# Patient Record
Sex: Male | Born: 1940
Health system: Southern US, Community
[De-identification: ages and names within clinical notes are randomized; demographics above are authoritative.]

## PROBLEM LIST (undated history)

## (undated) DIAGNOSIS — E785 Hyperlipidemia, unspecified: Secondary | ICD-10-CM

## (undated) DIAGNOSIS — E119 Type 2 diabetes mellitus without complications: Secondary | ICD-10-CM

## (undated) DIAGNOSIS — Z79899 Other long term (current) drug therapy: Secondary | ICD-10-CM

## (undated) DIAGNOSIS — J449 Chronic obstructive pulmonary disease, unspecified: Secondary | ICD-10-CM

## (undated) DIAGNOSIS — N051 Unspecified nephritic syndrome with focal and segmental glomerular lesions: Secondary | ICD-10-CM

## (undated) DIAGNOSIS — I10 Essential (primary) hypertension: Secondary | ICD-10-CM

## (undated) DIAGNOSIS — N186 End stage renal disease: Secondary | ICD-10-CM

## (undated) DIAGNOSIS — Z7960 Long term (current) use of unspecified immunomodulators and immunosuppressants: Secondary | ICD-10-CM

## (undated) DIAGNOSIS — E1121 Type 2 diabetes mellitus with diabetic nephropathy: Secondary | ICD-10-CM

## (undated) DIAGNOSIS — I4891 Unspecified atrial fibrillation: Secondary | ICD-10-CM

## (undated) HISTORY — DX: End stage renal disease: N18.6

## (undated) HISTORY — DX: Unspecified nephritic syndrome with focal and segmental glomerular lesions: N05.1

## (undated) HISTORY — DX: Unspecified atrial fibrillation: I48.91

## (undated) HISTORY — DX: Chronic obstructive pulmonary disease, unspecified: J44.9

## (undated) HISTORY — DX: Long term (current) use of unspecified immunomodulators and immunosuppressants: Z79.60

## (undated) HISTORY — DX: Type 2 diabetes mellitus with diabetic nephropathy: E11.21

## (undated) HISTORY — DX: Hyperlipidemia, unspecified: E78.5

## (undated) HISTORY — PX: COLONOSCOPY: SHX174

## (undated) HISTORY — DX: Essential (primary) hypertension: I10

## (undated) HISTORY — DX: Other long term (current) drug therapy: Z79.899

## (undated) HISTORY — DX: Type 2 diabetes mellitus without complications: E11.9

---

## 2013-12-03 DIAGNOSIS — Z940 Kidney transplant status: Secondary | ICD-10-CM

## 2013-12-03 DIAGNOSIS — I63219 Cerebral infarction due to unspecified occlusion or stenosis of unspecified vertebral arteries: Secondary | ICD-10-CM

## 2013-12-03 DIAGNOSIS — I6381 Other cerebral infarction due to occlusion or stenosis of small artery: Secondary | ICD-10-CM

## 2013-12-03 HISTORY — DX: Kidney transplant status: Z94.0

## 2013-12-03 HISTORY — PX: OTHER SURGICAL HISTORY: SHX169

## 2013-12-03 HISTORY — DX: Other cerebral infarction due to occlusion or stenosis of small artery: I63.81

## 2013-12-03 HISTORY — DX: Cerebral infarction due to unspecified occlusion or stenosis of basilar artery: I63.219

## 2015-05-10 DIAGNOSIS — C4492 Squamous cell carcinoma of skin, unspecified: Secondary | ICD-10-CM

## 2015-05-10 HISTORY — DX: Squamous cell carcinoma of skin, unspecified: C44.92

## 2015-12-06 DIAGNOSIS — E1151 Type 2 diabetes mellitus with diabetic peripheral angiopathy without gangrene: Secondary | ICD-10-CM | POA: Diagnosis not present

## 2015-12-06 DIAGNOSIS — E1140 Type 2 diabetes mellitus with diabetic neuropathy, unspecified: Secondary | ICD-10-CM | POA: Diagnosis not present

## 2015-12-06 DIAGNOSIS — B351 Tinea unguium: Secondary | ICD-10-CM | POA: Diagnosis not present

## 2015-12-20 DIAGNOSIS — E118 Type 2 diabetes mellitus with unspecified complications: Secondary | ICD-10-CM | POA: Diagnosis not present

## 2015-12-20 DIAGNOSIS — I69923 Fluency disorder following unspecified cerebrovascular disease: Secondary | ICD-10-CM | POA: Diagnosis not present

## 2016-01-13 DIAGNOSIS — Z940 Kidney transplant status: Secondary | ICD-10-CM | POA: Diagnosis not present

## 2016-01-13 DIAGNOSIS — Z79899 Other long term (current) drug therapy: Secondary | ICD-10-CM | POA: Diagnosis not present

## 2016-01-24 DIAGNOSIS — M19011 Primary osteoarthritis, right shoulder: Secondary | ICD-10-CM | POA: Diagnosis not present

## 2016-01-24 DIAGNOSIS — M19012 Primary osteoarthritis, left shoulder: Secondary | ICD-10-CM | POA: Diagnosis not present

## 2016-02-14 DIAGNOSIS — E1151 Type 2 diabetes mellitus with diabetic peripheral angiopathy without gangrene: Secondary | ICD-10-CM | POA: Diagnosis not present

## 2016-02-14 DIAGNOSIS — E1140 Type 2 diabetes mellitus with diabetic neuropathy, unspecified: Secondary | ICD-10-CM | POA: Diagnosis not present

## 2016-02-14 DIAGNOSIS — B351 Tinea unguium: Secondary | ICD-10-CM | POA: Diagnosis not present

## 2016-03-01 DIAGNOSIS — D899 Disorder involving the immune mechanism, unspecified: Secondary | ICD-10-CM | POA: Diagnosis not present

## 2016-03-01 DIAGNOSIS — N183 Chronic kidney disease, stage 3 (moderate): Secondary | ICD-10-CM | POA: Diagnosis not present

## 2016-03-01 DIAGNOSIS — M109 Gout, unspecified: Secondary | ICD-10-CM | POA: Diagnosis not present

## 2016-03-01 DIAGNOSIS — E119 Type 2 diabetes mellitus without complications: Secondary | ICD-10-CM | POA: Diagnosis not present

## 2016-03-01 DIAGNOSIS — I4891 Unspecified atrial fibrillation: Secondary | ICD-10-CM | POA: Diagnosis not present

## 2016-03-01 DIAGNOSIS — I499 Cardiac arrhythmia, unspecified: Secondary | ICD-10-CM | POA: Diagnosis not present

## 2016-03-01 DIAGNOSIS — Z940 Kidney transplant status: Secondary | ICD-10-CM | POA: Diagnosis not present

## 2016-03-01 DIAGNOSIS — D631 Anemia in chronic kidney disease: Secondary | ICD-10-CM | POA: Diagnosis not present

## 2016-03-01 DIAGNOSIS — I129 Hypertensive chronic kidney disease with stage 1 through stage 4 chronic kidney disease, or unspecified chronic kidney disease: Secondary | ICD-10-CM | POA: Diagnosis not present

## 2016-03-06 DIAGNOSIS — I493 Ventricular premature depolarization: Secondary | ICD-10-CM | POA: Diagnosis not present

## 2016-03-06 DIAGNOSIS — Z8673 Personal history of transient ischemic attack (TIA), and cerebral infarction without residual deficits: Secondary | ICD-10-CM | POA: Diagnosis not present

## 2016-03-06 DIAGNOSIS — Z940 Kidney transplant status: Secondary | ICD-10-CM | POA: Diagnosis not present

## 2016-03-06 DIAGNOSIS — I120 Hypertensive chronic kidney disease with stage 5 chronic kidney disease or end stage renal disease: Secondary | ICD-10-CM | POA: Diagnosis not present

## 2016-03-06 DIAGNOSIS — Z7982 Long term (current) use of aspirin: Secondary | ICD-10-CM | POA: Diagnosis not present

## 2016-03-06 DIAGNOSIS — M109 Gout, unspecified: Secondary | ICD-10-CM | POA: Diagnosis not present

## 2016-03-06 DIAGNOSIS — N2581 Secondary hyperparathyroidism of renal origin: Secondary | ICD-10-CM | POA: Diagnosis not present

## 2016-03-06 DIAGNOSIS — G4733 Obstructive sleep apnea (adult) (pediatric): Secondary | ICD-10-CM | POA: Diagnosis not present

## 2016-03-06 DIAGNOSIS — Z794 Long term (current) use of insulin: Secondary | ICD-10-CM | POA: Diagnosis not present

## 2016-03-06 DIAGNOSIS — I491 Atrial premature depolarization: Secondary | ICD-10-CM | POA: Diagnosis not present

## 2016-03-06 DIAGNOSIS — Z79899 Other long term (current) drug therapy: Secondary | ICD-10-CM | POA: Diagnosis not present

## 2016-03-06 DIAGNOSIS — I10 Essential (primary) hypertension: Secondary | ICD-10-CM | POA: Diagnosis not present

## 2016-03-06 DIAGNOSIS — N186 End stage renal disease: Secondary | ICD-10-CM | POA: Diagnosis not present

## 2016-03-06 DIAGNOSIS — E119 Type 2 diabetes mellitus without complications: Secondary | ICD-10-CM | POA: Diagnosis not present

## 2016-03-06 DIAGNOSIS — E785 Hyperlipidemia, unspecified: Secondary | ICD-10-CM | POA: Diagnosis not present

## 2016-03-06 DIAGNOSIS — I481 Persistent atrial fibrillation: Secondary | ICD-10-CM | POA: Diagnosis not present

## 2016-03-15 DIAGNOSIS — I350 Nonrheumatic aortic (valve) stenosis: Secondary | ICD-10-CM | POA: Diagnosis not present

## 2016-03-15 DIAGNOSIS — I700 Atherosclerosis of aorta: Secondary | ICD-10-CM | POA: Diagnosis not present

## 2016-03-15 DIAGNOSIS — I517 Cardiomegaly: Secondary | ICD-10-CM | POA: Diagnosis not present

## 2016-03-15 DIAGNOSIS — I481 Persistent atrial fibrillation: Secondary | ICD-10-CM | POA: Diagnosis not present

## 2016-03-16 DIAGNOSIS — H6123 Impacted cerumen, bilateral: Secondary | ICD-10-CM | POA: Diagnosis not present

## 2016-04-05 DIAGNOSIS — Z79899 Other long term (current) drug therapy: Secondary | ICD-10-CM | POA: Diagnosis not present

## 2016-04-05 DIAGNOSIS — Z5181 Encounter for therapeutic drug level monitoring: Secondary | ICD-10-CM | POA: Diagnosis not present

## 2016-04-10 DIAGNOSIS — L03031 Cellulitis of right toe: Secondary | ICD-10-CM | POA: Diagnosis not present

## 2016-04-10 DIAGNOSIS — Z5181 Encounter for therapeutic drug level monitoring: Secondary | ICD-10-CM | POA: Diagnosis not present

## 2016-04-10 DIAGNOSIS — Z79899 Other long term (current) drug therapy: Secondary | ICD-10-CM | POA: Diagnosis not present

## 2016-04-10 DIAGNOSIS — M79671 Pain in right foot: Secondary | ICD-10-CM | POA: Diagnosis not present

## 2016-04-10 DIAGNOSIS — M79674 Pain in right toe(s): Secondary | ICD-10-CM | POA: Diagnosis not present

## 2016-04-10 DIAGNOSIS — L600 Ingrowing nail: Secondary | ICD-10-CM | POA: Diagnosis not present

## 2016-04-18 DIAGNOSIS — Z961 Presence of intraocular lens: Secondary | ICD-10-CM | POA: Diagnosis not present

## 2016-04-18 DIAGNOSIS — E119 Type 2 diabetes mellitus without complications: Secondary | ICD-10-CM | POA: Diagnosis not present

## 2016-04-18 DIAGNOSIS — Z794 Long term (current) use of insulin: Secondary | ICD-10-CM | POA: Diagnosis not present

## 2016-04-19 DIAGNOSIS — Z79899 Other long term (current) drug therapy: Secondary | ICD-10-CM | POA: Diagnosis not present

## 2016-04-19 DIAGNOSIS — Z5181 Encounter for therapeutic drug level monitoring: Secondary | ICD-10-CM | POA: Diagnosis not present

## 2016-04-19 DIAGNOSIS — E1165 Type 2 diabetes mellitus with hyperglycemia: Secondary | ICD-10-CM | POA: Diagnosis not present

## 2016-04-25 DIAGNOSIS — D044 Carcinoma in situ of skin of scalp and neck: Secondary | ICD-10-CM | POA: Diagnosis not present

## 2016-04-26 DIAGNOSIS — L03031 Cellulitis of right toe: Secondary | ICD-10-CM | POA: Diagnosis not present

## 2016-04-26 DIAGNOSIS — L600 Ingrowing nail: Secondary | ICD-10-CM | POA: Diagnosis not present

## 2016-04-26 DIAGNOSIS — M79674 Pain in right toe(s): Secondary | ICD-10-CM | POA: Diagnosis not present

## 2016-04-26 DIAGNOSIS — M79671 Pain in right foot: Secondary | ICD-10-CM | POA: Diagnosis not present

## 2016-05-03 DIAGNOSIS — Z79899 Other long term (current) drug therapy: Secondary | ICD-10-CM | POA: Diagnosis not present

## 2016-05-03 DIAGNOSIS — Z5181 Encounter for therapeutic drug level monitoring: Secondary | ICD-10-CM | POA: Diagnosis not present

## 2016-05-08 DIAGNOSIS — E1140 Type 2 diabetes mellitus with diabetic neuropathy, unspecified: Secondary | ICD-10-CM | POA: Diagnosis not present

## 2016-05-08 DIAGNOSIS — B351 Tinea unguium: Secondary | ICD-10-CM | POA: Diagnosis not present

## 2016-05-08 DIAGNOSIS — E1151 Type 2 diabetes mellitus with diabetic peripheral angiopathy without gangrene: Secondary | ICD-10-CM | POA: Diagnosis not present

## 2016-05-09 DIAGNOSIS — M19011 Primary osteoarthritis, right shoulder: Secondary | ICD-10-CM | POA: Diagnosis not present

## 2016-05-09 DIAGNOSIS — Z7982 Long term (current) use of aspirin: Secondary | ICD-10-CM | POA: Diagnosis not present

## 2016-05-09 DIAGNOSIS — Z794 Long term (current) use of insulin: Secondary | ICD-10-CM | POA: Diagnosis not present

## 2016-05-09 DIAGNOSIS — E119 Type 2 diabetes mellitus without complications: Secondary | ICD-10-CM | POA: Diagnosis not present

## 2016-05-09 DIAGNOSIS — I10 Essential (primary) hypertension: Secondary | ICD-10-CM | POA: Diagnosis not present

## 2016-05-09 DIAGNOSIS — R509 Fever, unspecified: Secondary | ICD-10-CM | POA: Diagnosis not present

## 2016-05-09 DIAGNOSIS — M25511 Pain in right shoulder: Secondary | ICD-10-CM | POA: Diagnosis not present

## 2016-05-09 DIAGNOSIS — Z940 Kidney transplant status: Secondary | ICD-10-CM | POA: Diagnosis not present

## 2016-05-09 DIAGNOSIS — Z79899 Other long term (current) drug therapy: Secondary | ICD-10-CM | POA: Diagnosis not present

## 2016-05-17 DIAGNOSIS — M7551 Bursitis of right shoulder: Secondary | ICD-10-CM | POA: Diagnosis not present

## 2016-05-17 DIAGNOSIS — M19011 Primary osteoarthritis, right shoulder: Secondary | ICD-10-CM | POA: Diagnosis not present

## 2016-05-17 DIAGNOSIS — M25511 Pain in right shoulder: Secondary | ICD-10-CM | POA: Diagnosis not present

## 2016-06-01 DIAGNOSIS — R112 Nausea with vomiting, unspecified: Secondary | ICD-10-CM | POA: Diagnosis not present

## 2016-06-01 DIAGNOSIS — N289 Disorder of kidney and ureter, unspecified: Secondary | ICD-10-CM | POA: Diagnosis not present

## 2016-06-01 DIAGNOSIS — Z888 Allergy status to other drugs, medicaments and biological substances status: Secondary | ICD-10-CM | POA: Diagnosis not present

## 2016-06-01 DIAGNOSIS — N189 Chronic kidney disease, unspecified: Secondary | ICD-10-CM | POA: Diagnosis not present

## 2016-06-01 DIAGNOSIS — Z7902 Long term (current) use of antithrombotics/antiplatelets: Secondary | ICD-10-CM | POA: Diagnosis not present

## 2016-06-01 DIAGNOSIS — Z79899 Other long term (current) drug therapy: Secondary | ICD-10-CM | POA: Diagnosis not present

## 2016-06-01 DIAGNOSIS — N179 Acute kidney failure, unspecified: Secondary | ICD-10-CM | POA: Diagnosis not present

## 2016-06-01 DIAGNOSIS — Z794 Long term (current) use of insulin: Secondary | ICD-10-CM | POA: Diagnosis not present

## 2016-06-01 DIAGNOSIS — R109 Unspecified abdominal pain: Secondary | ICD-10-CM | POA: Diagnosis not present

## 2016-06-01 DIAGNOSIS — M109 Gout, unspecified: Secondary | ICD-10-CM | POA: Diagnosis not present

## 2016-06-01 DIAGNOSIS — E1122 Type 2 diabetes mellitus with diabetic chronic kidney disease: Secondary | ICD-10-CM | POA: Diagnosis not present

## 2016-06-01 DIAGNOSIS — E860 Dehydration: Secondary | ICD-10-CM | POA: Diagnosis not present

## 2016-06-01 DIAGNOSIS — Z940 Kidney transplant status: Secondary | ICD-10-CM | POA: Diagnosis not present

## 2016-06-02 DIAGNOSIS — E1122 Type 2 diabetes mellitus with diabetic chronic kidney disease: Secondary | ICD-10-CM | POA: Diagnosis not present

## 2016-06-02 DIAGNOSIS — N179 Acute kidney failure, unspecified: Secondary | ICD-10-CM | POA: Diagnosis not present

## 2016-06-02 DIAGNOSIS — R109 Unspecified abdominal pain: Secondary | ICD-10-CM | POA: Diagnosis not present

## 2016-06-06 DIAGNOSIS — N289 Disorder of kidney and ureter, unspecified: Secondary | ICD-10-CM | POA: Diagnosis not present

## 2016-06-06 DIAGNOSIS — R109 Unspecified abdominal pain: Secondary | ICD-10-CM | POA: Diagnosis not present

## 2016-06-06 DIAGNOSIS — Z940 Kidney transplant status: Secondary | ICD-10-CM | POA: Diagnosis not present

## 2016-06-13 DIAGNOSIS — Z940 Kidney transplant status: Secondary | ICD-10-CM | POA: Diagnosis not present

## 2016-06-13 DIAGNOSIS — R109 Unspecified abdominal pain: Secondary | ICD-10-CM | POA: Diagnosis not present

## 2016-06-13 DIAGNOSIS — N289 Disorder of kidney and ureter, unspecified: Secondary | ICD-10-CM | POA: Diagnosis not present

## 2016-07-11 DIAGNOSIS — R0989 Other specified symptoms and signs involving the circulatory and respiratory systems: Secondary | ICD-10-CM | POA: Diagnosis not present

## 2016-07-11 DIAGNOSIS — I480 Paroxysmal atrial fibrillation: Secondary | ICD-10-CM | POA: Diagnosis not present

## 2016-07-11 DIAGNOSIS — I639 Cerebral infarction, unspecified: Secondary | ICD-10-CM | POA: Diagnosis not present

## 2016-07-12 DIAGNOSIS — N186 End stage renal disease: Secondary | ICD-10-CM | POA: Diagnosis not present

## 2016-07-12 DIAGNOSIS — Z940 Kidney transplant status: Secondary | ICD-10-CM | POA: Diagnosis not present

## 2016-07-12 DIAGNOSIS — E785 Hyperlipidemia, unspecified: Secondary | ICD-10-CM | POA: Diagnosis not present

## 2016-07-12 DIAGNOSIS — I4891 Unspecified atrial fibrillation: Secondary | ICD-10-CM | POA: Diagnosis not present

## 2016-07-12 DIAGNOSIS — I129 Hypertensive chronic kidney disease with stage 1 through stage 4 chronic kidney disease, or unspecified chronic kidney disease: Secondary | ICD-10-CM | POA: Diagnosis not present

## 2016-07-12 DIAGNOSIS — Z7901 Long term (current) use of anticoagulants: Secondary | ICD-10-CM | POA: Diagnosis not present

## 2016-07-12 DIAGNOSIS — Z794 Long term (current) use of insulin: Secondary | ICD-10-CM | POA: Diagnosis not present

## 2016-07-12 DIAGNOSIS — D631 Anemia in chronic kidney disease: Secondary | ICD-10-CM | POA: Diagnosis not present

## 2016-07-12 DIAGNOSIS — I480 Paroxysmal atrial fibrillation: Secondary | ICD-10-CM | POA: Diagnosis not present

## 2016-07-12 DIAGNOSIS — I120 Hypertensive chronic kidney disease with stage 5 chronic kidney disease or end stage renal disease: Secondary | ICD-10-CM | POA: Diagnosis not present

## 2016-07-12 DIAGNOSIS — D899 Disorder involving the immune mechanism, unspecified: Secondary | ICD-10-CM | POA: Diagnosis not present

## 2016-07-12 DIAGNOSIS — Z79899 Other long term (current) drug therapy: Secondary | ICD-10-CM | POA: Diagnosis not present

## 2016-07-12 DIAGNOSIS — N2581 Secondary hyperparathyroidism of renal origin: Secondary | ICD-10-CM | POA: Diagnosis not present

## 2016-07-12 DIAGNOSIS — E1121 Type 2 diabetes mellitus with diabetic nephropathy: Secondary | ICD-10-CM | POA: Diagnosis not present

## 2016-07-12 DIAGNOSIS — M109 Gout, unspecified: Secondary | ICD-10-CM | POA: Diagnosis not present

## 2016-07-20 DIAGNOSIS — I639 Cerebral infarction, unspecified: Secondary | ICD-10-CM | POA: Diagnosis not present

## 2016-07-20 DIAGNOSIS — I480 Paroxysmal atrial fibrillation: Secondary | ICD-10-CM | POA: Diagnosis not present

## 2016-07-20 DIAGNOSIS — R0989 Other specified symptoms and signs involving the circulatory and respiratory systems: Secondary | ICD-10-CM | POA: Diagnosis not present

## 2016-07-24 DIAGNOSIS — Z79899 Other long term (current) drug therapy: Secondary | ICD-10-CM | POA: Diagnosis not present

## 2016-07-24 DIAGNOSIS — E1151 Type 2 diabetes mellitus with diabetic peripheral angiopathy without gangrene: Secondary | ICD-10-CM | POA: Diagnosis not present

## 2016-07-24 DIAGNOSIS — M1A4710 Other secondary chronic gout, right ankle and foot, without tophus (tophi): Secondary | ICD-10-CM | POA: Diagnosis not present

## 2016-07-24 DIAGNOSIS — E1140 Type 2 diabetes mellitus with diabetic neuropathy, unspecified: Secondary | ICD-10-CM | POA: Diagnosis not present

## 2016-07-24 DIAGNOSIS — Z5181 Encounter for therapeutic drug level monitoring: Secondary | ICD-10-CM | POA: Diagnosis not present

## 2016-07-24 DIAGNOSIS — B351 Tinea unguium: Secondary | ICD-10-CM | POA: Diagnosis not present

## 2016-07-25 DIAGNOSIS — Z719 Counseling, unspecified: Secondary | ICD-10-CM | POA: Diagnosis not present

## 2016-07-25 DIAGNOSIS — M25562 Pain in left knee: Secondary | ICD-10-CM | POA: Diagnosis not present

## 2016-08-01 DIAGNOSIS — Z719 Counseling, unspecified: Secondary | ICD-10-CM | POA: Diagnosis not present

## 2016-08-01 DIAGNOSIS — M25562 Pain in left knee: Secondary | ICD-10-CM | POA: Diagnosis not present

## 2016-08-14 DIAGNOSIS — Z79899 Other long term (current) drug therapy: Secondary | ICD-10-CM | POA: Diagnosis not present

## 2016-08-14 DIAGNOSIS — Z5181 Encounter for therapeutic drug level monitoring: Secondary | ICD-10-CM | POA: Diagnosis not present

## 2016-08-16 DIAGNOSIS — M11261 Other chondrocalcinosis, right knee: Secondary | ICD-10-CM | POA: Diagnosis not present

## 2016-08-16 DIAGNOSIS — Z794 Long term (current) use of insulin: Secondary | ICD-10-CM | POA: Diagnosis not present

## 2016-08-16 DIAGNOSIS — N183 Chronic kidney disease, stage 3 (moderate): Secondary | ICD-10-CM | POA: Diagnosis not present

## 2016-08-16 DIAGNOSIS — M11262 Other chondrocalcinosis, left knee: Secondary | ICD-10-CM | POA: Diagnosis not present

## 2016-08-16 DIAGNOSIS — Z940 Kidney transplant status: Secondary | ICD-10-CM | POA: Diagnosis not present

## 2016-08-16 DIAGNOSIS — G8929 Other chronic pain: Secondary | ICD-10-CM | POA: Diagnosis not present

## 2016-08-16 DIAGNOSIS — I129 Hypertensive chronic kidney disease with stage 1 through stage 4 chronic kidney disease, or unspecified chronic kidney disease: Secondary | ICD-10-CM | POA: Diagnosis not present

## 2016-08-16 DIAGNOSIS — M109 Gout, unspecified: Secondary | ICD-10-CM | POA: Diagnosis not present

## 2016-08-16 DIAGNOSIS — I69320 Aphasia following cerebral infarction: Secondary | ICD-10-CM | POA: Diagnosis not present

## 2016-08-16 DIAGNOSIS — Z7901 Long term (current) use of anticoagulants: Secondary | ICD-10-CM | POA: Diagnosis not present

## 2016-08-16 DIAGNOSIS — E119 Type 2 diabetes mellitus without complications: Secondary | ICD-10-CM | POA: Diagnosis not present

## 2016-08-16 DIAGNOSIS — Z87891 Personal history of nicotine dependence: Secondary | ICD-10-CM | POA: Diagnosis not present

## 2016-08-16 DIAGNOSIS — M170 Bilateral primary osteoarthritis of knee: Secondary | ICD-10-CM | POA: Diagnosis not present

## 2016-08-22 DIAGNOSIS — M7050 Other bursitis of knee, unspecified knee: Secondary | ICD-10-CM | POA: Diagnosis not present

## 2016-08-22 DIAGNOSIS — M7632 Iliotibial band syndrome, left leg: Secondary | ICD-10-CM | POA: Diagnosis not present

## 2016-08-24 DIAGNOSIS — E1165 Type 2 diabetes mellitus with hyperglycemia: Secondary | ICD-10-CM | POA: Diagnosis not present

## 2016-08-24 DIAGNOSIS — I10 Essential (primary) hypertension: Secondary | ICD-10-CM | POA: Diagnosis not present

## 2016-08-24 DIAGNOSIS — M7632 Iliotibial band syndrome, left leg: Secondary | ICD-10-CM | POA: Diagnosis not present

## 2016-08-24 DIAGNOSIS — Z1389 Encounter for screening for other disorder: Secondary | ICD-10-CM | POA: Diagnosis not present

## 2016-08-24 DIAGNOSIS — Z6825 Body mass index (BMI) 25.0-25.9, adult: Secondary | ICD-10-CM | POA: Diagnosis not present

## 2016-08-24 DIAGNOSIS — I69923 Fluency disorder following unspecified cerebrovascular disease: Secondary | ICD-10-CM | POA: Diagnosis not present

## 2016-08-24 DIAGNOSIS — M7050 Other bursitis of knee, unspecified knee: Secondary | ICD-10-CM | POA: Diagnosis not present

## 2016-08-24 DIAGNOSIS — Z0000 Encounter for general adult medical examination without abnormal findings: Secondary | ICD-10-CM | POA: Diagnosis not present

## 2016-08-24 DIAGNOSIS — Z1211 Encounter for screening for malignant neoplasm of colon: Secondary | ICD-10-CM | POA: Diagnosis not present

## 2016-08-24 DIAGNOSIS — E7801 Familial hypercholesterolemia: Secondary | ICD-10-CM | POA: Diagnosis not present

## 2016-08-27 DIAGNOSIS — M7050 Other bursitis of knee, unspecified knee: Secondary | ICD-10-CM | POA: Diagnosis not present

## 2016-08-27 DIAGNOSIS — M7632 Iliotibial band syndrome, left leg: Secondary | ICD-10-CM | POA: Diagnosis not present

## 2016-08-29 DIAGNOSIS — M7050 Other bursitis of knee, unspecified knee: Secondary | ICD-10-CM | POA: Diagnosis not present

## 2016-08-29 DIAGNOSIS — M7632 Iliotibial band syndrome, left leg: Secondary | ICD-10-CM | POA: Diagnosis not present

## 2016-08-31 DIAGNOSIS — M7632 Iliotibial band syndrome, left leg: Secondary | ICD-10-CM | POA: Diagnosis not present

## 2016-08-31 DIAGNOSIS — M7050 Other bursitis of knee, unspecified knee: Secondary | ICD-10-CM | POA: Diagnosis not present

## 2016-09-03 DIAGNOSIS — M7050 Other bursitis of knee, unspecified knee: Secondary | ICD-10-CM | POA: Diagnosis not present

## 2016-09-03 DIAGNOSIS — M7632 Iliotibial band syndrome, left leg: Secondary | ICD-10-CM | POA: Diagnosis not present

## 2016-09-05 DIAGNOSIS — M7050 Other bursitis of knee, unspecified knee: Secondary | ICD-10-CM | POA: Diagnosis not present

## 2016-09-05 DIAGNOSIS — M7632 Iliotibial band syndrome, left leg: Secondary | ICD-10-CM | POA: Diagnosis not present

## 2016-09-07 DIAGNOSIS — M7050 Other bursitis of knee, unspecified knee: Secondary | ICD-10-CM | POA: Diagnosis not present

## 2016-09-07 DIAGNOSIS — M7632 Iliotibial band syndrome, left leg: Secondary | ICD-10-CM | POA: Diagnosis not present

## 2016-09-10 DIAGNOSIS — M7632 Iliotibial band syndrome, left leg: Secondary | ICD-10-CM | POA: Diagnosis not present

## 2016-09-10 DIAGNOSIS — M7050 Other bursitis of knee, unspecified knee: Secondary | ICD-10-CM | POA: Diagnosis not present

## 2016-09-12 DIAGNOSIS — M7050 Other bursitis of knee, unspecified knee: Secondary | ICD-10-CM | POA: Diagnosis not present

## 2016-09-12 DIAGNOSIS — M7632 Iliotibial band syndrome, left leg: Secondary | ICD-10-CM | POA: Diagnosis not present

## 2016-09-14 DIAGNOSIS — M7632 Iliotibial band syndrome, left leg: Secondary | ICD-10-CM | POA: Diagnosis not present

## 2016-09-14 DIAGNOSIS — M7050 Other bursitis of knee, unspecified knee: Secondary | ICD-10-CM | POA: Diagnosis not present

## 2016-09-17 DIAGNOSIS — Z7901 Long term (current) use of anticoagulants: Secondary | ICD-10-CM | POA: Diagnosis not present

## 2016-09-17 DIAGNOSIS — E119 Type 2 diabetes mellitus without complications: Secondary | ICD-10-CM | POA: Diagnosis not present

## 2016-09-17 DIAGNOSIS — I10 Essential (primary) hypertension: Secondary | ICD-10-CM | POA: Diagnosis not present

## 2016-09-17 DIAGNOSIS — M19012 Primary osteoarthritis, left shoulder: Secondary | ICD-10-CM | POA: Diagnosis not present

## 2016-09-17 DIAGNOSIS — Z79899 Other long term (current) drug therapy: Secondary | ICD-10-CM | POA: Diagnosis not present

## 2016-09-17 DIAGNOSIS — M19011 Primary osteoarthritis, right shoulder: Secondary | ICD-10-CM | POA: Diagnosis not present

## 2016-09-17 DIAGNOSIS — Z794 Long term (current) use of insulin: Secondary | ICD-10-CM | POA: Diagnosis not present

## 2016-09-17 DIAGNOSIS — M7632 Iliotibial band syndrome, left leg: Secondary | ICD-10-CM | POA: Diagnosis not present

## 2016-09-17 DIAGNOSIS — Z87891 Personal history of nicotine dependence: Secondary | ICD-10-CM | POA: Diagnosis not present

## 2016-09-17 DIAGNOSIS — M7050 Other bursitis of knee, unspecified knee: Secondary | ICD-10-CM | POA: Diagnosis not present

## 2016-09-17 DIAGNOSIS — Z940 Kidney transplant status: Secondary | ICD-10-CM | POA: Diagnosis not present

## 2016-09-18 DIAGNOSIS — H6123 Impacted cerumen, bilateral: Secondary | ICD-10-CM | POA: Diagnosis not present

## 2016-09-19 DIAGNOSIS — M7632 Iliotibial band syndrome, left leg: Secondary | ICD-10-CM | POA: Diagnosis not present

## 2016-09-19 DIAGNOSIS — M7050 Other bursitis of knee, unspecified knee: Secondary | ICD-10-CM | POA: Diagnosis not present

## 2016-09-21 DIAGNOSIS — M7050 Other bursitis of knee, unspecified knee: Secondary | ICD-10-CM | POA: Diagnosis not present

## 2016-09-21 DIAGNOSIS — M7632 Iliotibial band syndrome, left leg: Secondary | ICD-10-CM | POA: Diagnosis not present

## 2016-09-25 DIAGNOSIS — Z79899 Other long term (current) drug therapy: Secondary | ICD-10-CM | POA: Diagnosis not present

## 2016-09-25 DIAGNOSIS — Z5181 Encounter for therapeutic drug level monitoring: Secondary | ICD-10-CM | POA: Diagnosis not present

## 2016-09-26 DIAGNOSIS — M7632 Iliotibial band syndrome, left leg: Secondary | ICD-10-CM | POA: Diagnosis not present

## 2016-09-26 DIAGNOSIS — M7050 Other bursitis of knee, unspecified knee: Secondary | ICD-10-CM | POA: Diagnosis not present

## 2016-10-01 DIAGNOSIS — M7632 Iliotibial band syndrome, left leg: Secondary | ICD-10-CM | POA: Diagnosis not present

## 2016-10-01 DIAGNOSIS — M7050 Other bursitis of knee, unspecified knee: Secondary | ICD-10-CM | POA: Diagnosis not present

## 2016-10-03 DIAGNOSIS — M7050 Other bursitis of knee, unspecified knee: Secondary | ICD-10-CM | POA: Diagnosis not present

## 2016-10-03 DIAGNOSIS — M7632 Iliotibial band syndrome, left leg: Secondary | ICD-10-CM | POA: Diagnosis not present

## 2016-10-05 DIAGNOSIS — M7632 Iliotibial band syndrome, left leg: Secondary | ICD-10-CM | POA: Diagnosis not present

## 2016-10-05 DIAGNOSIS — M7050 Other bursitis of knee, unspecified knee: Secondary | ICD-10-CM | POA: Diagnosis not present

## 2016-10-08 DIAGNOSIS — M7050 Other bursitis of knee, unspecified knee: Secondary | ICD-10-CM | POA: Diagnosis not present

## 2016-10-08 DIAGNOSIS — M7632 Iliotibial band syndrome, left leg: Secondary | ICD-10-CM | POA: Diagnosis not present

## 2016-10-09 DIAGNOSIS — E1140 Type 2 diabetes mellitus with diabetic neuropathy, unspecified: Secondary | ICD-10-CM | POA: Diagnosis not present

## 2016-10-09 DIAGNOSIS — E1151 Type 2 diabetes mellitus with diabetic peripheral angiopathy without gangrene: Secondary | ICD-10-CM | POA: Diagnosis not present

## 2016-10-09 DIAGNOSIS — B351 Tinea unguium: Secondary | ICD-10-CM | POA: Diagnosis not present

## 2016-10-11 DIAGNOSIS — Z5181 Encounter for therapeutic drug level monitoring: Secondary | ICD-10-CM | POA: Diagnosis not present

## 2016-10-11 DIAGNOSIS — Z79899 Other long term (current) drug therapy: Secondary | ICD-10-CM | POA: Diagnosis not present

## 2016-10-18 DIAGNOSIS — N2581 Secondary hyperparathyroidism of renal origin: Secondary | ICD-10-CM | POA: Diagnosis not present

## 2016-10-18 DIAGNOSIS — Z87891 Personal history of nicotine dependence: Secondary | ICD-10-CM | POA: Diagnosis not present

## 2016-10-18 DIAGNOSIS — Z9989 Dependence on other enabling machines and devices: Secondary | ICD-10-CM | POA: Diagnosis not present

## 2016-10-18 DIAGNOSIS — E1122 Type 2 diabetes mellitus with diabetic chronic kidney disease: Secondary | ICD-10-CM | POA: Diagnosis not present

## 2016-10-18 DIAGNOSIS — N186 End stage renal disease: Secondary | ICD-10-CM | POA: Diagnosis not present

## 2016-10-18 DIAGNOSIS — E785 Hyperlipidemia, unspecified: Secondary | ICD-10-CM | POA: Diagnosis not present

## 2016-10-18 DIAGNOSIS — Z794 Long term (current) use of insulin: Secondary | ICD-10-CM | POA: Diagnosis not present

## 2016-10-18 DIAGNOSIS — I120 Hypertensive chronic kidney disease with stage 5 chronic kidney disease or end stage renal disease: Secondary | ICD-10-CM | POA: Diagnosis not present

## 2016-10-18 DIAGNOSIS — E119 Type 2 diabetes mellitus without complications: Secondary | ICD-10-CM | POA: Diagnosis not present

## 2016-10-18 DIAGNOSIS — Z201 Contact with and (suspected) exposure to tuberculosis: Secondary | ICD-10-CM | POA: Diagnosis not present

## 2016-10-18 DIAGNOSIS — M109 Gout, unspecified: Secondary | ICD-10-CM | POA: Diagnosis not present

## 2016-10-18 DIAGNOSIS — D8989 Other specified disorders involving the immune mechanism, not elsewhere classified: Secondary | ICD-10-CM | POA: Diagnosis not present

## 2016-10-18 DIAGNOSIS — I10 Essential (primary) hypertension: Secondary | ICD-10-CM | POA: Diagnosis not present

## 2016-10-18 DIAGNOSIS — G4733 Obstructive sleep apnea (adult) (pediatric): Secondary | ICD-10-CM | POA: Diagnosis not present

## 2016-10-18 DIAGNOSIS — Z940 Kidney transplant status: Secondary | ICD-10-CM | POA: Diagnosis not present

## 2016-10-18 DIAGNOSIS — Z8673 Personal history of transient ischemic attack (TIA), and cerebral infarction without residual deficits: Secondary | ICD-10-CM | POA: Diagnosis not present

## 2016-10-18 DIAGNOSIS — Z4822 Encounter for aftercare following kidney transplant: Secondary | ICD-10-CM | POA: Diagnosis not present

## 2016-11-13 DIAGNOSIS — Z79899 Other long term (current) drug therapy: Secondary | ICD-10-CM | POA: Diagnosis not present

## 2016-11-13 DIAGNOSIS — Z5181 Encounter for therapeutic drug level monitoring: Secondary | ICD-10-CM | POA: Diagnosis not present

## 2016-12-11 DIAGNOSIS — Z5181 Encounter for therapeutic drug level monitoring: Secondary | ICD-10-CM | POA: Diagnosis not present

## 2016-12-11 DIAGNOSIS — Z79899 Other long term (current) drug therapy: Secondary | ICD-10-CM | POA: Diagnosis not present

## 2016-12-18 DIAGNOSIS — E1151 Type 2 diabetes mellitus with diabetic peripheral angiopathy without gangrene: Secondary | ICD-10-CM | POA: Diagnosis not present

## 2016-12-18 DIAGNOSIS — E1140 Type 2 diabetes mellitus with diabetic neuropathy, unspecified: Secondary | ICD-10-CM | POA: Diagnosis not present

## 2016-12-18 DIAGNOSIS — B351 Tinea unguium: Secondary | ICD-10-CM | POA: Diagnosis not present

## 2017-01-01 DIAGNOSIS — Z5181 Encounter for therapeutic drug level monitoring: Secondary | ICD-10-CM | POA: Diagnosis not present

## 2017-01-01 DIAGNOSIS — Z79899 Other long term (current) drug therapy: Secondary | ICD-10-CM | POA: Diagnosis not present

## 2017-01-08 DIAGNOSIS — L570 Actinic keratosis: Secondary | ICD-10-CM | POA: Diagnosis not present

## 2017-01-08 DIAGNOSIS — C4491 Basal cell carcinoma of skin, unspecified: Secondary | ICD-10-CM

## 2017-01-08 DIAGNOSIS — C44612 Basal cell carcinoma of skin of right upper limb, including shoulder: Secondary | ICD-10-CM | POA: Diagnosis not present

## 2017-01-08 HISTORY — DX: Basal cell carcinoma of skin, unspecified: C44.91

## 2017-01-15 DIAGNOSIS — E1165 Type 2 diabetes mellitus with hyperglycemia: Secondary | ICD-10-CM | POA: Diagnosis not present

## 2017-02-12 DIAGNOSIS — Z5181 Encounter for therapeutic drug level monitoring: Secondary | ICD-10-CM | POA: Diagnosis not present

## 2017-02-12 DIAGNOSIS — Z79899 Other long term (current) drug therapy: Secondary | ICD-10-CM | POA: Diagnosis not present

## 2017-02-26 DIAGNOSIS — D631 Anemia in chronic kidney disease: Secondary | ICD-10-CM | POA: Diagnosis not present

## 2017-02-26 DIAGNOSIS — I129 Hypertensive chronic kidney disease with stage 1 through stage 4 chronic kidney disease, or unspecified chronic kidney disease: Secondary | ICD-10-CM | POA: Diagnosis not present

## 2017-02-26 DIAGNOSIS — Z7901 Long term (current) use of anticoagulants: Secondary | ICD-10-CM | POA: Diagnosis not present

## 2017-02-26 DIAGNOSIS — R809 Proteinuria, unspecified: Secondary | ICD-10-CM | POA: Diagnosis not present

## 2017-02-26 DIAGNOSIS — E785 Hyperlipidemia, unspecified: Secondary | ICD-10-CM | POA: Diagnosis not present

## 2017-02-26 DIAGNOSIS — Z794 Long term (current) use of insulin: Secondary | ICD-10-CM | POA: Diagnosis not present

## 2017-02-26 DIAGNOSIS — Z87891 Personal history of nicotine dependence: Secondary | ICD-10-CM | POA: Diagnosis not present

## 2017-02-26 DIAGNOSIS — Z79899 Other long term (current) drug therapy: Secondary | ICD-10-CM | POA: Diagnosis not present

## 2017-02-26 DIAGNOSIS — M109 Gout, unspecified: Secondary | ICD-10-CM | POA: Diagnosis not present

## 2017-02-26 DIAGNOSIS — I4891 Unspecified atrial fibrillation: Secondary | ICD-10-CM | POA: Diagnosis not present

## 2017-02-26 DIAGNOSIS — E1122 Type 2 diabetes mellitus with diabetic chronic kidney disease: Secondary | ICD-10-CM | POA: Diagnosis not present

## 2017-02-26 DIAGNOSIS — Z940 Kidney transplant status: Secondary | ICD-10-CM | POA: Diagnosis not present

## 2017-02-26 DIAGNOSIS — N183 Chronic kidney disease, stage 3 (moderate): Secondary | ICD-10-CM | POA: Diagnosis not present

## 2017-03-05 DIAGNOSIS — E1151 Type 2 diabetes mellitus with diabetic peripheral angiopathy without gangrene: Secondary | ICD-10-CM | POA: Diagnosis not present

## 2017-03-05 DIAGNOSIS — E1140 Type 2 diabetes mellitus with diabetic neuropathy, unspecified: Secondary | ICD-10-CM | POA: Diagnosis not present

## 2017-03-05 DIAGNOSIS — B351 Tinea unguium: Secondary | ICD-10-CM | POA: Diagnosis not present

## 2017-03-18 DIAGNOSIS — H6123 Impacted cerumen, bilateral: Secondary | ICD-10-CM | POA: Diagnosis not present

## 2017-04-11 DIAGNOSIS — Z5181 Encounter for therapeutic drug level monitoring: Secondary | ICD-10-CM | POA: Diagnosis not present

## 2017-04-11 DIAGNOSIS — Z79899 Other long term (current) drug therapy: Secondary | ICD-10-CM | POA: Diagnosis not present

## 2017-04-23 DIAGNOSIS — Z794 Long term (current) use of insulin: Secondary | ICD-10-CM | POA: Diagnosis not present

## 2017-04-23 DIAGNOSIS — E119 Type 2 diabetes mellitus without complications: Secondary | ICD-10-CM | POA: Diagnosis not present

## 2017-04-23 DIAGNOSIS — Z961 Presence of intraocular lens: Secondary | ICD-10-CM | POA: Diagnosis not present

## 2017-05-08 DIAGNOSIS — M25511 Pain in right shoulder: Secondary | ICD-10-CM | POA: Diagnosis not present

## 2017-05-08 DIAGNOSIS — M546 Pain in thoracic spine: Secondary | ICD-10-CM | POA: Diagnosis not present

## 2017-05-08 DIAGNOSIS — M9902 Segmental and somatic dysfunction of thoracic region: Secondary | ICD-10-CM | POA: Diagnosis not present

## 2017-05-08 DIAGNOSIS — M9901 Segmental and somatic dysfunction of cervical region: Secondary | ICD-10-CM | POA: Diagnosis not present

## 2017-05-08 DIAGNOSIS — M5032 Other cervical disc degeneration, mid-cervical region, unspecified level: Secondary | ICD-10-CM | POA: Diagnosis not present

## 2017-05-10 DIAGNOSIS — M9902 Segmental and somatic dysfunction of thoracic region: Secondary | ICD-10-CM | POA: Diagnosis not present

## 2017-05-10 DIAGNOSIS — M9901 Segmental and somatic dysfunction of cervical region: Secondary | ICD-10-CM | POA: Diagnosis not present

## 2017-05-10 DIAGNOSIS — M546 Pain in thoracic spine: Secondary | ICD-10-CM | POA: Diagnosis not present

## 2017-05-10 DIAGNOSIS — M5032 Other cervical disc degeneration, mid-cervical region, unspecified level: Secondary | ICD-10-CM | POA: Diagnosis not present

## 2017-05-10 DIAGNOSIS — M25511 Pain in right shoulder: Secondary | ICD-10-CM | POA: Diagnosis not present

## 2017-05-14 DIAGNOSIS — B351 Tinea unguium: Secondary | ICD-10-CM | POA: Diagnosis not present

## 2017-05-14 DIAGNOSIS — E1140 Type 2 diabetes mellitus with diabetic neuropathy, unspecified: Secondary | ICD-10-CM | POA: Diagnosis not present

## 2017-05-14 DIAGNOSIS — E1151 Type 2 diabetes mellitus with diabetic peripheral angiopathy without gangrene: Secondary | ICD-10-CM | POA: Diagnosis not present

## 2017-05-30 DIAGNOSIS — Z5181 Encounter for therapeutic drug level monitoring: Secondary | ICD-10-CM | POA: Diagnosis not present

## 2017-05-30 DIAGNOSIS — Z79899 Other long term (current) drug therapy: Secondary | ICD-10-CM | POA: Diagnosis not present

## 2017-06-12 DIAGNOSIS — Z4822 Encounter for aftercare following kidney transplant: Secondary | ICD-10-CM | POA: Diagnosis not present

## 2017-06-12 DIAGNOSIS — I4891 Unspecified atrial fibrillation: Secondary | ICD-10-CM | POA: Diagnosis not present

## 2017-06-12 DIAGNOSIS — N184 Chronic kidney disease, stage 4 (severe): Secondary | ICD-10-CM | POA: Diagnosis not present

## 2017-06-12 DIAGNOSIS — Z7901 Long term (current) use of anticoagulants: Secondary | ICD-10-CM | POA: Diagnosis not present

## 2017-06-12 DIAGNOSIS — Z940 Kidney transplant status: Secondary | ICD-10-CM | POA: Diagnosis not present

## 2017-06-12 DIAGNOSIS — E1121 Type 2 diabetes mellitus with diabetic nephropathy: Secondary | ICD-10-CM | POA: Diagnosis not present

## 2017-06-12 DIAGNOSIS — N2581 Secondary hyperparathyroidism of renal origin: Secondary | ICD-10-CM | POA: Diagnosis not present

## 2017-06-12 DIAGNOSIS — E119 Type 2 diabetes mellitus without complications: Secondary | ICD-10-CM | POA: Diagnosis not present

## 2017-06-12 DIAGNOSIS — N269 Renal sclerosis, unspecified: Secondary | ICD-10-CM | POA: Diagnosis not present

## 2017-06-12 DIAGNOSIS — Z888 Allergy status to other drugs, medicaments and biological substances status: Secondary | ICD-10-CM | POA: Diagnosis not present

## 2017-06-12 DIAGNOSIS — D899 Disorder involving the immune mechanism, unspecified: Secondary | ICD-10-CM | POA: Diagnosis not present

## 2017-06-12 DIAGNOSIS — I639 Cerebral infarction, unspecified: Secondary | ICD-10-CM | POA: Diagnosis not present

## 2017-06-12 DIAGNOSIS — E785 Hyperlipidemia, unspecified: Secondary | ICD-10-CM | POA: Diagnosis not present

## 2017-06-12 DIAGNOSIS — M25512 Pain in left shoulder: Secondary | ICD-10-CM | POA: Diagnosis not present

## 2017-06-12 DIAGNOSIS — M109 Gout, unspecified: Secondary | ICD-10-CM | POA: Diagnosis not present

## 2017-06-12 DIAGNOSIS — Z79899 Other long term (current) drug therapy: Secondary | ICD-10-CM | POA: Diagnosis not present

## 2017-06-12 DIAGNOSIS — N183 Chronic kidney disease, stage 3 (moderate): Secondary | ICD-10-CM | POA: Diagnosis not present

## 2017-06-12 DIAGNOSIS — I129 Hypertensive chronic kidney disease with stage 1 through stage 4 chronic kidney disease, or unspecified chronic kidney disease: Secondary | ICD-10-CM | POA: Diagnosis not present

## 2017-06-12 DIAGNOSIS — D631 Anemia in chronic kidney disease: Secondary | ICD-10-CM | POA: Diagnosis not present

## 2017-06-12 DIAGNOSIS — E1122 Type 2 diabetes mellitus with diabetic chronic kidney disease: Secondary | ICD-10-CM | POA: Diagnosis not present

## 2017-06-12 DIAGNOSIS — R809 Proteinuria, unspecified: Secondary | ICD-10-CM | POA: Diagnosis not present

## 2017-06-12 DIAGNOSIS — I69328 Other speech and language deficits following cerebral infarction: Secondary | ICD-10-CM | POA: Diagnosis not present

## 2017-06-12 DIAGNOSIS — Z794 Long term (current) use of insulin: Secondary | ICD-10-CM | POA: Diagnosis not present

## 2017-06-17 DIAGNOSIS — M19011 Primary osteoarthritis, right shoulder: Secondary | ICD-10-CM | POA: Diagnosis not present

## 2017-06-17 DIAGNOSIS — E1122 Type 2 diabetes mellitus with diabetic chronic kidney disease: Secondary | ICD-10-CM | POA: Diagnosis not present

## 2017-06-17 DIAGNOSIS — N189 Chronic kidney disease, unspecified: Secondary | ICD-10-CM | POA: Diagnosis not present

## 2017-06-17 DIAGNOSIS — M25511 Pain in right shoulder: Secondary | ICD-10-CM | POA: Diagnosis not present

## 2017-07-01 DIAGNOSIS — Z5181 Encounter for therapeutic drug level monitoring: Secondary | ICD-10-CM | POA: Diagnosis not present

## 2017-07-01 DIAGNOSIS — Z79899 Other long term (current) drug therapy: Secondary | ICD-10-CM | POA: Diagnosis not present

## 2017-07-30 DIAGNOSIS — B351 Tinea unguium: Secondary | ICD-10-CM | POA: Diagnosis not present

## 2017-07-30 DIAGNOSIS — Z79899 Other long term (current) drug therapy: Secondary | ICD-10-CM | POA: Diagnosis not present

## 2017-07-30 DIAGNOSIS — E1140 Type 2 diabetes mellitus with diabetic neuropathy, unspecified: Secondary | ICD-10-CM | POA: Diagnosis not present

## 2017-07-30 DIAGNOSIS — E1151 Type 2 diabetes mellitus with diabetic peripheral angiopathy without gangrene: Secondary | ICD-10-CM | POA: Diagnosis not present

## 2017-07-30 DIAGNOSIS — Z5181 Encounter for therapeutic drug level monitoring: Secondary | ICD-10-CM | POA: Diagnosis not present

## 2017-08-02 DIAGNOSIS — I10 Essential (primary) hypertension: Secondary | ICD-10-CM | POA: Diagnosis not present

## 2017-08-02 DIAGNOSIS — H43812 Vitreous degeneration, left eye: Secondary | ICD-10-CM | POA: Diagnosis not present

## 2017-08-02 DIAGNOSIS — H35033 Hypertensive retinopathy, bilateral: Secondary | ICD-10-CM | POA: Diagnosis not present

## 2017-08-02 DIAGNOSIS — H43811 Vitreous degeneration, right eye: Secondary | ICD-10-CM | POA: Diagnosis not present

## 2017-08-02 DIAGNOSIS — H524 Presbyopia: Secondary | ICD-10-CM | POA: Diagnosis not present

## 2017-08-02 DIAGNOSIS — Z794 Long term (current) use of insulin: Secondary | ICD-10-CM | POA: Diagnosis not present

## 2017-08-02 DIAGNOSIS — H35032 Hypertensive retinopathy, left eye: Secondary | ICD-10-CM | POA: Diagnosis not present

## 2017-08-02 DIAGNOSIS — E119 Type 2 diabetes mellitus without complications: Secondary | ICD-10-CM | POA: Diagnosis not present

## 2017-08-02 DIAGNOSIS — H43813 Vitreous degeneration, bilateral: Secondary | ICD-10-CM | POA: Diagnosis not present

## 2017-08-02 DIAGNOSIS — H35031 Hypertensive retinopathy, right eye: Secondary | ICD-10-CM | POA: Diagnosis not present

## 2017-08-02 DIAGNOSIS — Z961 Presence of intraocular lens: Secondary | ICD-10-CM | POA: Diagnosis not present

## 2017-08-02 DIAGNOSIS — Z9849 Cataract extraction status, unspecified eye: Secondary | ICD-10-CM | POA: Diagnosis not present

## 2017-08-12 DIAGNOSIS — Z940 Kidney transplant status: Secondary | ICD-10-CM | POA: Diagnosis not present

## 2017-08-12 DIAGNOSIS — I10 Essential (primary) hypertension: Secondary | ICD-10-CM | POA: Diagnosis not present

## 2017-08-12 DIAGNOSIS — E1122 Type 2 diabetes mellitus with diabetic chronic kidney disease: Secondary | ICD-10-CM | POA: Diagnosis not present

## 2017-08-12 DIAGNOSIS — I69321 Dysphasia following cerebral infarction: Secondary | ICD-10-CM | POA: Diagnosis not present

## 2017-08-12 DIAGNOSIS — E782 Mixed hyperlipidemia: Secondary | ICD-10-CM | POA: Diagnosis not present

## 2017-08-12 DIAGNOSIS — Z23 Encounter for immunization: Secondary | ICD-10-CM | POA: Diagnosis not present

## 2017-08-12 DIAGNOSIS — D649 Anemia, unspecified: Secondary | ICD-10-CM | POA: Diagnosis not present

## 2017-08-12 DIAGNOSIS — I482 Chronic atrial fibrillation: Secondary | ICD-10-CM | POA: Diagnosis not present

## 2017-08-27 DIAGNOSIS — D044 Carcinoma in situ of skin of scalp and neck: Secondary | ICD-10-CM | POA: Diagnosis not present

## 2017-08-28 DIAGNOSIS — I482 Chronic atrial fibrillation: Secondary | ICD-10-CM | POA: Diagnosis not present

## 2017-09-04 DIAGNOSIS — M19012 Primary osteoarthritis, left shoulder: Secondary | ICD-10-CM | POA: Diagnosis not present

## 2017-09-04 DIAGNOSIS — Z940 Kidney transplant status: Secondary | ICD-10-CM | POA: Diagnosis not present

## 2017-09-04 DIAGNOSIS — M19011 Primary osteoarthritis, right shoulder: Secondary | ICD-10-CM | POA: Diagnosis not present

## 2017-09-20 DIAGNOSIS — J069 Acute upper respiratory infection, unspecified: Secondary | ICD-10-CM | POA: Diagnosis not present

## 2017-09-20 DIAGNOSIS — J029 Acute pharyngitis, unspecified: Secondary | ICD-10-CM | POA: Diagnosis not present

## 2017-09-20 DIAGNOSIS — Z6826 Body mass index (BMI) 26.0-26.9, adult: Secondary | ICD-10-CM | POA: Diagnosis not present

## 2017-10-04 DIAGNOSIS — I482 Chronic atrial fibrillation: Secondary | ICD-10-CM | POA: Diagnosis not present

## 2017-10-15 DIAGNOSIS — B351 Tinea unguium: Secondary | ICD-10-CM | POA: Diagnosis not present

## 2017-10-15 DIAGNOSIS — E1140 Type 2 diabetes mellitus with diabetic neuropathy, unspecified: Secondary | ICD-10-CM | POA: Diagnosis not present

## 2017-10-15 DIAGNOSIS — E1151 Type 2 diabetes mellitus with diabetic peripheral angiopathy without gangrene: Secondary | ICD-10-CM | POA: Diagnosis not present

## 2017-10-28 DIAGNOSIS — H6123 Impacted cerumen, bilateral: Secondary | ICD-10-CM | POA: Diagnosis not present

## 2017-11-05 DIAGNOSIS — E119 Type 2 diabetes mellitus without complications: Secondary | ICD-10-CM | POA: Diagnosis not present

## 2017-11-05 DIAGNOSIS — D649 Anemia, unspecified: Secondary | ICD-10-CM | POA: Diagnosis not present

## 2017-11-05 DIAGNOSIS — D8989 Other specified disorders involving the immune mechanism, not elsewhere classified: Secondary | ICD-10-CM | POA: Diagnosis not present

## 2017-11-05 DIAGNOSIS — Z794 Long term (current) use of insulin: Secondary | ICD-10-CM | POA: Diagnosis not present

## 2017-11-05 DIAGNOSIS — Z79899 Other long term (current) drug therapy: Secondary | ICD-10-CM | POA: Diagnosis not present

## 2017-11-05 DIAGNOSIS — I4891 Unspecified atrial fibrillation: Secondary | ICD-10-CM | POA: Diagnosis not present

## 2017-11-05 DIAGNOSIS — E785 Hyperlipidemia, unspecified: Secondary | ICD-10-CM | POA: Diagnosis not present

## 2017-11-05 DIAGNOSIS — Z792 Long term (current) use of antibiotics: Secondary | ICD-10-CM | POA: Diagnosis not present

## 2017-11-05 DIAGNOSIS — Z7901 Long term (current) use of anticoagulants: Secondary | ICD-10-CM | POA: Diagnosis not present

## 2017-11-05 DIAGNOSIS — D631 Anemia in chronic kidney disease: Secondary | ICD-10-CM | POA: Diagnosis not present

## 2017-11-05 DIAGNOSIS — Z940 Kidney transplant status: Secondary | ICD-10-CM | POA: Diagnosis not present

## 2017-11-05 DIAGNOSIS — Z4822 Encounter for aftercare following kidney transplant: Secondary | ICD-10-CM | POA: Diagnosis not present

## 2017-11-05 DIAGNOSIS — I69328 Other speech and language deficits following cerebral infarction: Secondary | ICD-10-CM | POA: Diagnosis not present

## 2017-11-05 DIAGNOSIS — Z8619 Personal history of other infectious and parasitic diseases: Secondary | ICD-10-CM | POA: Diagnosis not present

## 2017-11-05 DIAGNOSIS — I10 Essential (primary) hypertension: Secondary | ICD-10-CM | POA: Diagnosis not present

## 2017-11-05 DIAGNOSIS — Z8673 Personal history of transient ischemic attack (TIA), and cerebral infarction without residual deficits: Secondary | ICD-10-CM | POA: Diagnosis not present

## 2017-11-05 DIAGNOSIS — Z87891 Personal history of nicotine dependence: Secondary | ICD-10-CM | POA: Diagnosis not present

## 2017-11-06 DIAGNOSIS — I482 Chronic atrial fibrillation: Secondary | ICD-10-CM | POA: Diagnosis not present

## 2017-11-08 DIAGNOSIS — Z79899 Other long term (current) drug therapy: Secondary | ICD-10-CM | POA: Diagnosis not present

## 2017-11-08 DIAGNOSIS — Z940 Kidney transplant status: Secondary | ICD-10-CM | POA: Diagnosis not present

## 2017-11-14 DIAGNOSIS — E782 Mixed hyperlipidemia: Secondary | ICD-10-CM | POA: Diagnosis not present

## 2017-11-14 DIAGNOSIS — I69321 Dysphasia following cerebral infarction: Secondary | ICD-10-CM | POA: Diagnosis not present

## 2017-11-14 DIAGNOSIS — G4733 Obstructive sleep apnea (adult) (pediatric): Secondary | ICD-10-CM | POA: Diagnosis not present

## 2017-11-14 DIAGNOSIS — G6289 Other specified polyneuropathies: Secondary | ICD-10-CM | POA: Diagnosis not present

## 2017-11-14 DIAGNOSIS — Z940 Kidney transplant status: Secondary | ICD-10-CM | POA: Diagnosis not present

## 2017-11-14 DIAGNOSIS — Z1389 Encounter for screening for other disorder: Secondary | ICD-10-CM | POA: Diagnosis not present

## 2017-11-14 DIAGNOSIS — E1122 Type 2 diabetes mellitus with diabetic chronic kidney disease: Secondary | ICD-10-CM | POA: Diagnosis not present

## 2017-11-14 DIAGNOSIS — I482 Chronic atrial fibrillation: Secondary | ICD-10-CM | POA: Diagnosis not present

## 2017-11-14 DIAGNOSIS — Z0001 Encounter for general adult medical examination with abnormal findings: Secondary | ICD-10-CM | POA: Diagnosis not present

## 2017-11-14 DIAGNOSIS — E1142 Type 2 diabetes mellitus with diabetic polyneuropathy: Secondary | ICD-10-CM | POA: Diagnosis not present

## 2017-11-14 DIAGNOSIS — M109 Gout, unspecified: Secondary | ICD-10-CM | POA: Diagnosis not present

## 2017-11-14 DIAGNOSIS — I10 Essential (primary) hypertension: Secondary | ICD-10-CM | POA: Diagnosis not present

## 2017-12-04 DIAGNOSIS — E782 Mixed hyperlipidemia: Secondary | ICD-10-CM | POA: Diagnosis not present

## 2017-12-26 DIAGNOSIS — M10332 Gout due to renal impairment, left wrist: Secondary | ICD-10-CM | POA: Diagnosis not present

## 2017-12-26 DIAGNOSIS — L03114 Cellulitis of left upper limb: Secondary | ICD-10-CM | POA: Diagnosis not present

## 2017-12-31 DIAGNOSIS — E1151 Type 2 diabetes mellitus with diabetic peripheral angiopathy without gangrene: Secondary | ICD-10-CM | POA: Diagnosis not present

## 2017-12-31 DIAGNOSIS — E1140 Type 2 diabetes mellitus with diabetic neuropathy, unspecified: Secondary | ICD-10-CM | POA: Diagnosis not present

## 2017-12-31 DIAGNOSIS — B351 Tinea unguium: Secondary | ICD-10-CM | POA: Diagnosis not present

## 2018-01-01 DIAGNOSIS — L570 Actinic keratosis: Secondary | ICD-10-CM | POA: Diagnosis not present

## 2018-01-01 DIAGNOSIS — D044 Carcinoma in situ of skin of scalp and neck: Secondary | ICD-10-CM | POA: Diagnosis not present

## 2018-01-02 DIAGNOSIS — I482 Chronic atrial fibrillation: Secondary | ICD-10-CM | POA: Diagnosis not present

## 2018-01-08 DIAGNOSIS — M9901 Segmental and somatic dysfunction of cervical region: Secondary | ICD-10-CM | POA: Diagnosis not present

## 2018-01-08 DIAGNOSIS — S134XXA Sprain of ligaments of cervical spine, initial encounter: Secondary | ICD-10-CM | POA: Diagnosis not present

## 2018-01-09 DIAGNOSIS — I482 Chronic atrial fibrillation: Secondary | ICD-10-CM | POA: Diagnosis not present

## 2018-01-10 DIAGNOSIS — S134XXA Sprain of ligaments of cervical spine, initial encounter: Secondary | ICD-10-CM | POA: Diagnosis not present

## 2018-01-10 DIAGNOSIS — M9901 Segmental and somatic dysfunction of cervical region: Secondary | ICD-10-CM | POA: Diagnosis not present

## 2018-01-13 DIAGNOSIS — M9901 Segmental and somatic dysfunction of cervical region: Secondary | ICD-10-CM | POA: Diagnosis not present

## 2018-01-13 DIAGNOSIS — S134XXA Sprain of ligaments of cervical spine, initial encounter: Secondary | ICD-10-CM | POA: Diagnosis not present

## 2018-01-20 DIAGNOSIS — S134XXA Sprain of ligaments of cervical spine, initial encounter: Secondary | ICD-10-CM | POA: Diagnosis not present

## 2018-01-20 DIAGNOSIS — M9901 Segmental and somatic dysfunction of cervical region: Secondary | ICD-10-CM | POA: Diagnosis not present

## 2018-01-24 DIAGNOSIS — M9901 Segmental and somatic dysfunction of cervical region: Secondary | ICD-10-CM | POA: Diagnosis not present

## 2018-01-24 DIAGNOSIS — S134XXA Sprain of ligaments of cervical spine, initial encounter: Secondary | ICD-10-CM | POA: Diagnosis not present

## 2018-01-29 DIAGNOSIS — S134XXA Sprain of ligaments of cervical spine, initial encounter: Secondary | ICD-10-CM | POA: Diagnosis not present

## 2018-01-29 DIAGNOSIS — M9901 Segmental and somatic dysfunction of cervical region: Secondary | ICD-10-CM | POA: Diagnosis not present

## 2018-02-05 DIAGNOSIS — M9901 Segmental and somatic dysfunction of cervical region: Secondary | ICD-10-CM | POA: Diagnosis not present

## 2018-02-05 DIAGNOSIS — S134XXA Sprain of ligaments of cervical spine, initial encounter: Secondary | ICD-10-CM | POA: Diagnosis not present

## 2018-02-06 DIAGNOSIS — I482 Chronic atrial fibrillation: Secondary | ICD-10-CM | POA: Diagnosis not present

## 2018-02-10 DIAGNOSIS — Z6826 Body mass index (BMI) 26.0-26.9, adult: Secondary | ICD-10-CM | POA: Diagnosis not present

## 2018-02-10 DIAGNOSIS — J209 Acute bronchitis, unspecified: Secondary | ICD-10-CM | POA: Diagnosis not present

## 2018-02-11 DIAGNOSIS — N183 Chronic kidney disease, stage 3 (moderate): Secondary | ICD-10-CM | POA: Diagnosis not present

## 2018-02-11 DIAGNOSIS — E1142 Type 2 diabetes mellitus with diabetic polyneuropathy: Secondary | ICD-10-CM | POA: Diagnosis not present

## 2018-02-11 DIAGNOSIS — N2581 Secondary hyperparathyroidism of renal origin: Secondary | ICD-10-CM | POA: Diagnosis not present

## 2018-02-11 DIAGNOSIS — I10 Essential (primary) hypertension: Secondary | ICD-10-CM | POA: Diagnosis not present

## 2018-02-11 DIAGNOSIS — E782 Mixed hyperlipidemia: Secondary | ICD-10-CM | POA: Diagnosis not present

## 2018-02-11 DIAGNOSIS — Z79899 Other long term (current) drug therapy: Secondary | ICD-10-CM | POA: Diagnosis not present

## 2018-02-11 DIAGNOSIS — E1122 Type 2 diabetes mellitus with diabetic chronic kidney disease: Secondary | ICD-10-CM | POA: Diagnosis not present

## 2018-02-11 DIAGNOSIS — Z9189 Other specified personal risk factors, not elsewhere classified: Secondary | ICD-10-CM | POA: Diagnosis not present

## 2018-02-12 DIAGNOSIS — S134XXA Sprain of ligaments of cervical spine, initial encounter: Secondary | ICD-10-CM | POA: Diagnosis not present

## 2018-02-12 DIAGNOSIS — M9901 Segmental and somatic dysfunction of cervical region: Secondary | ICD-10-CM | POA: Diagnosis not present

## 2018-02-14 DIAGNOSIS — E782 Mixed hyperlipidemia: Secondary | ICD-10-CM | POA: Diagnosis not present

## 2018-02-14 DIAGNOSIS — M109 Gout, unspecified: Secondary | ICD-10-CM | POA: Diagnosis not present

## 2018-02-14 DIAGNOSIS — I10 Essential (primary) hypertension: Secondary | ICD-10-CM | POA: Diagnosis not present

## 2018-02-14 DIAGNOSIS — Z940 Kidney transplant status: Secondary | ICD-10-CM | POA: Diagnosis not present

## 2018-02-14 DIAGNOSIS — I482 Chronic atrial fibrillation: Secondary | ICD-10-CM | POA: Diagnosis not present

## 2018-02-14 DIAGNOSIS — G6289 Other specified polyneuropathies: Secondary | ICD-10-CM | POA: Diagnosis not present

## 2018-02-14 DIAGNOSIS — E1142 Type 2 diabetes mellitus with diabetic polyneuropathy: Secondary | ICD-10-CM | POA: Diagnosis not present

## 2018-02-14 DIAGNOSIS — E1122 Type 2 diabetes mellitus with diabetic chronic kidney disease: Secondary | ICD-10-CM | POA: Diagnosis not present

## 2018-02-19 DIAGNOSIS — S134XXA Sprain of ligaments of cervical spine, initial encounter: Secondary | ICD-10-CM | POA: Diagnosis not present

## 2018-02-19 DIAGNOSIS — M9901 Segmental and somatic dysfunction of cervical region: Secondary | ICD-10-CM | POA: Diagnosis not present

## 2018-02-20 DIAGNOSIS — M19012 Primary osteoarthritis, left shoulder: Secondary | ICD-10-CM | POA: Diagnosis not present

## 2018-02-20 DIAGNOSIS — M25511 Pain in right shoulder: Secondary | ICD-10-CM | POA: Diagnosis not present

## 2018-02-20 DIAGNOSIS — Z8673 Personal history of transient ischemic attack (TIA), and cerebral infarction without residual deficits: Secondary | ICD-10-CM | POA: Diagnosis not present

## 2018-02-20 DIAGNOSIS — M12811 Other specific arthropathies, not elsewhere classified, right shoulder: Secondary | ICD-10-CM | POA: Diagnosis not present

## 2018-02-26 DIAGNOSIS — S134XXA Sprain of ligaments of cervical spine, initial encounter: Secondary | ICD-10-CM | POA: Diagnosis not present

## 2018-02-26 DIAGNOSIS — M9901 Segmental and somatic dysfunction of cervical region: Secondary | ICD-10-CM | POA: Diagnosis not present

## 2018-03-03 DIAGNOSIS — E119 Type 2 diabetes mellitus without complications: Secondary | ICD-10-CM | POA: Diagnosis not present

## 2018-03-03 DIAGNOSIS — E785 Hyperlipidemia, unspecified: Secondary | ICD-10-CM | POA: Diagnosis not present

## 2018-03-03 DIAGNOSIS — R809 Proteinuria, unspecified: Secondary | ICD-10-CM | POA: Diagnosis not present

## 2018-03-03 DIAGNOSIS — N183 Chronic kidney disease, stage 3 (moderate): Secondary | ICD-10-CM | POA: Diagnosis not present

## 2018-03-03 DIAGNOSIS — I129 Hypertensive chronic kidney disease with stage 1 through stage 4 chronic kidney disease, or unspecified chronic kidney disease: Secondary | ICD-10-CM | POA: Diagnosis not present

## 2018-03-03 DIAGNOSIS — I4891 Unspecified atrial fibrillation: Secondary | ICD-10-CM | POA: Diagnosis not present

## 2018-03-03 DIAGNOSIS — Z792 Long term (current) use of antibiotics: Secondary | ICD-10-CM | POA: Diagnosis not present

## 2018-03-03 DIAGNOSIS — Z940 Kidney transplant status: Secondary | ICD-10-CM | POA: Diagnosis not present

## 2018-03-03 DIAGNOSIS — D631 Anemia in chronic kidney disease: Secondary | ICD-10-CM | POA: Diagnosis not present

## 2018-03-03 DIAGNOSIS — M25512 Pain in left shoulder: Secondary | ICD-10-CM | POA: Diagnosis not present

## 2018-03-03 DIAGNOSIS — Z8673 Personal history of transient ischemic attack (TIA), and cerebral infarction without residual deficits: Secondary | ICD-10-CM | POA: Diagnosis not present

## 2018-03-03 DIAGNOSIS — Z794 Long term (current) use of insulin: Secondary | ICD-10-CM | POA: Diagnosis not present

## 2018-03-03 DIAGNOSIS — Z7982 Long term (current) use of aspirin: Secondary | ICD-10-CM | POA: Diagnosis not present

## 2018-03-03 DIAGNOSIS — Z79899 Other long term (current) drug therapy: Secondary | ICD-10-CM | POA: Diagnosis not present

## 2018-03-03 DIAGNOSIS — E1122 Type 2 diabetes mellitus with diabetic chronic kidney disease: Secondary | ICD-10-CM | POA: Diagnosis not present

## 2018-03-03 DIAGNOSIS — M109 Gout, unspecified: Secondary | ICD-10-CM | POA: Diagnosis not present

## 2018-03-06 DIAGNOSIS — I482 Chronic atrial fibrillation: Secondary | ICD-10-CM | POA: Diagnosis not present

## 2018-03-12 DIAGNOSIS — M9901 Segmental and somatic dysfunction of cervical region: Secondary | ICD-10-CM | POA: Diagnosis not present

## 2018-03-12 DIAGNOSIS — S134XXA Sprain of ligaments of cervical spine, initial encounter: Secondary | ICD-10-CM | POA: Diagnosis not present

## 2018-03-25 DIAGNOSIS — E1151 Type 2 diabetes mellitus with diabetic peripheral angiopathy without gangrene: Secondary | ICD-10-CM | POA: Diagnosis not present

## 2018-03-25 DIAGNOSIS — B351 Tinea unguium: Secondary | ICD-10-CM | POA: Diagnosis not present

## 2018-03-25 DIAGNOSIS — E1140 Type 2 diabetes mellitus with diabetic neuropathy, unspecified: Secondary | ICD-10-CM | POA: Diagnosis not present

## 2018-04-02 DIAGNOSIS — M67919 Unspecified disorder of synovium and tendon, unspecified shoulder: Secondary | ICD-10-CM | POA: Diagnosis not present

## 2018-04-02 DIAGNOSIS — M719 Bursopathy, unspecified: Secondary | ICD-10-CM | POA: Diagnosis not present

## 2018-04-02 DIAGNOSIS — M12811 Other specific arthropathies, not elsewhere classified, right shoulder: Secondary | ICD-10-CM | POA: Diagnosis not present

## 2018-04-02 DIAGNOSIS — Z8673 Personal history of transient ischemic attack (TIA), and cerebral infarction without residual deficits: Secondary | ICD-10-CM | POA: Diagnosis not present

## 2018-04-02 DIAGNOSIS — M25512 Pain in left shoulder: Secondary | ICD-10-CM | POA: Diagnosis not present

## 2018-04-02 DIAGNOSIS — M19012 Primary osteoarthritis, left shoulder: Secondary | ICD-10-CM | POA: Diagnosis not present

## 2018-04-10 DIAGNOSIS — I482 Chronic atrial fibrillation: Secondary | ICD-10-CM | POA: Diagnosis not present

## 2018-04-30 DIAGNOSIS — H40023 Open angle with borderline findings, high risk, bilateral: Secondary | ICD-10-CM | POA: Diagnosis not present

## 2018-04-30 DIAGNOSIS — Z961 Presence of intraocular lens: Secondary | ICD-10-CM | POA: Diagnosis not present

## 2018-04-30 DIAGNOSIS — Z794 Long term (current) use of insulin: Secondary | ICD-10-CM | POA: Diagnosis not present

## 2018-04-30 DIAGNOSIS — E119 Type 2 diabetes mellitus without complications: Secondary | ICD-10-CM | POA: Diagnosis not present

## 2018-05-05 DIAGNOSIS — L821 Other seborrheic keratosis: Secondary | ICD-10-CM | POA: Diagnosis not present

## 2018-05-05 DIAGNOSIS — D044 Carcinoma in situ of skin of scalp and neck: Secondary | ICD-10-CM | POA: Diagnosis not present

## 2018-05-15 DIAGNOSIS — G4733 Obstructive sleep apnea (adult) (pediatric): Secondary | ICD-10-CM | POA: Diagnosis not present

## 2018-05-15 DIAGNOSIS — E1122 Type 2 diabetes mellitus with diabetic chronic kidney disease: Secondary | ICD-10-CM | POA: Diagnosis not present

## 2018-05-15 DIAGNOSIS — N183 Chronic kidney disease, stage 3 (moderate): Secondary | ICD-10-CM | POA: Diagnosis not present

## 2018-05-15 DIAGNOSIS — Z9189 Other specified personal risk factors, not elsewhere classified: Secondary | ICD-10-CM | POA: Diagnosis not present

## 2018-05-15 DIAGNOSIS — I10 Essential (primary) hypertension: Secondary | ICD-10-CM | POA: Diagnosis not present

## 2018-05-15 DIAGNOSIS — Z79899 Other long term (current) drug therapy: Secondary | ICD-10-CM | POA: Diagnosis not present

## 2018-05-15 DIAGNOSIS — Z940 Kidney transplant status: Secondary | ICD-10-CM | POA: Diagnosis not present

## 2018-05-15 DIAGNOSIS — I482 Chronic atrial fibrillation: Secondary | ICD-10-CM | POA: Diagnosis not present

## 2018-05-15 DIAGNOSIS — E782 Mixed hyperlipidemia: Secondary | ICD-10-CM | POA: Diagnosis not present

## 2018-05-21 DIAGNOSIS — I482 Chronic atrial fibrillation: Secondary | ICD-10-CM | POA: Diagnosis not present

## 2018-05-21 DIAGNOSIS — N183 Chronic kidney disease, stage 3 (moderate): Secondary | ICD-10-CM | POA: Diagnosis not present

## 2018-05-21 DIAGNOSIS — Z6825 Body mass index (BMI) 25.0-25.9, adult: Secondary | ICD-10-CM | POA: Diagnosis not present

## 2018-05-21 DIAGNOSIS — I10 Essential (primary) hypertension: Secondary | ICD-10-CM | POA: Diagnosis not present

## 2018-05-21 DIAGNOSIS — Z940 Kidney transplant status: Secondary | ICD-10-CM | POA: Diagnosis not present

## 2018-05-21 DIAGNOSIS — E1122 Type 2 diabetes mellitus with diabetic chronic kidney disease: Secondary | ICD-10-CM | POA: Diagnosis not present

## 2018-05-21 DIAGNOSIS — I69321 Dysphasia following cerebral infarction: Secondary | ICD-10-CM | POA: Diagnosis not present

## 2018-05-21 DIAGNOSIS — E782 Mixed hyperlipidemia: Secondary | ICD-10-CM | POA: Diagnosis not present

## 2018-05-26 DIAGNOSIS — H903 Sensorineural hearing loss, bilateral: Secondary | ICD-10-CM | POA: Diagnosis not present

## 2018-06-11 DIAGNOSIS — Z79899 Other long term (current) drug therapy: Secondary | ICD-10-CM | POA: Diagnosis not present

## 2018-06-11 DIAGNOSIS — I482 Chronic atrial fibrillation: Secondary | ICD-10-CM | POA: Diagnosis not present

## 2018-06-17 DIAGNOSIS — E1151 Type 2 diabetes mellitus with diabetic peripheral angiopathy without gangrene: Secondary | ICD-10-CM | POA: Diagnosis not present

## 2018-06-17 DIAGNOSIS — B351 Tinea unguium: Secondary | ICD-10-CM | POA: Diagnosis not present

## 2018-06-17 DIAGNOSIS — E1140 Type 2 diabetes mellitus with diabetic neuropathy, unspecified: Secondary | ICD-10-CM | POA: Diagnosis not present

## 2018-06-23 DIAGNOSIS — R040 Epistaxis: Secondary | ICD-10-CM | POA: Diagnosis not present

## 2018-06-23 DIAGNOSIS — D899 Disorder involving the immune mechanism, unspecified: Secondary | ICD-10-CM | POA: Diagnosis not present

## 2018-06-23 DIAGNOSIS — J3089 Other allergic rhinitis: Secondary | ICD-10-CM | POA: Diagnosis not present

## 2018-06-23 DIAGNOSIS — Z940 Kidney transplant status: Secondary | ICD-10-CM | POA: Diagnosis not present

## 2018-07-09 DIAGNOSIS — I482 Chronic atrial fibrillation: Secondary | ICD-10-CM | POA: Diagnosis not present

## 2018-07-09 DIAGNOSIS — I10 Essential (primary) hypertension: Secondary | ICD-10-CM | POA: Diagnosis not present

## 2018-07-10 DIAGNOSIS — C4442 Squamous cell carcinoma of skin of scalp and neck: Secondary | ICD-10-CM | POA: Diagnosis not present

## 2018-07-23 DIAGNOSIS — Z940 Kidney transplant status: Secondary | ICD-10-CM | POA: Diagnosis not present

## 2018-07-23 DIAGNOSIS — R040 Epistaxis: Secondary | ICD-10-CM | POA: Diagnosis not present

## 2018-07-23 DIAGNOSIS — J3089 Other allergic rhinitis: Secondary | ICD-10-CM | POA: Diagnosis not present

## 2018-07-23 DIAGNOSIS — D899 Disorder involving the immune mechanism, unspecified: Secondary | ICD-10-CM | POA: Diagnosis not present

## 2018-07-30 DIAGNOSIS — I482 Chronic atrial fibrillation: Secondary | ICD-10-CM | POA: Diagnosis not present

## 2018-07-30 DIAGNOSIS — M109 Gout, unspecified: Secondary | ICD-10-CM | POA: Diagnosis not present

## 2018-07-30 DIAGNOSIS — E1122 Type 2 diabetes mellitus with diabetic chronic kidney disease: Secondary | ICD-10-CM | POA: Diagnosis not present

## 2018-07-30 DIAGNOSIS — I10 Essential (primary) hypertension: Secondary | ICD-10-CM | POA: Diagnosis not present

## 2018-08-14 DIAGNOSIS — Z79899 Other long term (current) drug therapy: Secondary | ICD-10-CM | POA: Diagnosis not present

## 2018-08-14 DIAGNOSIS — I482 Chronic atrial fibrillation: Secondary | ICD-10-CM | POA: Diagnosis not present

## 2018-08-19 DIAGNOSIS — E1151 Type 2 diabetes mellitus with diabetic peripheral angiopathy without gangrene: Secondary | ICD-10-CM | POA: Diagnosis not present

## 2018-08-19 DIAGNOSIS — B351 Tinea unguium: Secondary | ICD-10-CM | POA: Diagnosis not present

## 2018-08-19 DIAGNOSIS — E1140 Type 2 diabetes mellitus with diabetic neuropathy, unspecified: Secondary | ICD-10-CM | POA: Diagnosis not present

## 2018-08-26 DIAGNOSIS — E782 Mixed hyperlipidemia: Secondary | ICD-10-CM | POA: Diagnosis not present

## 2018-08-26 DIAGNOSIS — Z940 Kidney transplant status: Secondary | ICD-10-CM | POA: Diagnosis not present

## 2018-08-26 DIAGNOSIS — D519 Vitamin B12 deficiency anemia, unspecified: Secondary | ICD-10-CM | POA: Diagnosis not present

## 2018-08-26 DIAGNOSIS — I10 Essential (primary) hypertension: Secondary | ICD-10-CM | POA: Diagnosis not present

## 2018-08-26 DIAGNOSIS — G4733 Obstructive sleep apnea (adult) (pediatric): Secondary | ICD-10-CM | POA: Diagnosis not present

## 2018-08-26 DIAGNOSIS — Z6825 Body mass index (BMI) 25.0-25.9, adult: Secondary | ICD-10-CM | POA: Diagnosis not present

## 2018-08-26 DIAGNOSIS — M109 Gout, unspecified: Secondary | ICD-10-CM | POA: Diagnosis not present

## 2018-08-26 DIAGNOSIS — I482 Chronic atrial fibrillation: Secondary | ICD-10-CM | POA: Diagnosis not present

## 2018-08-26 DIAGNOSIS — G6289 Other specified polyneuropathies: Secondary | ICD-10-CM | POA: Diagnosis not present

## 2018-08-26 DIAGNOSIS — I69321 Dysphasia following cerebral infarction: Secondary | ICD-10-CM | POA: Diagnosis not present

## 2018-08-26 DIAGNOSIS — N183 Chronic kidney disease, stage 3 (moderate): Secondary | ICD-10-CM | POA: Diagnosis not present

## 2018-08-26 DIAGNOSIS — E1142 Type 2 diabetes mellitus with diabetic polyneuropathy: Secondary | ICD-10-CM | POA: Diagnosis not present

## 2018-08-26 DIAGNOSIS — E1122 Type 2 diabetes mellitus with diabetic chronic kidney disease: Secondary | ICD-10-CM | POA: Diagnosis not present

## 2018-08-26 DIAGNOSIS — D649 Anemia, unspecified: Secondary | ICD-10-CM | POA: Diagnosis not present

## 2018-09-08 DIAGNOSIS — N400 Enlarged prostate without lower urinary tract symptoms: Secondary | ICD-10-CM | POA: Diagnosis not present

## 2018-09-08 DIAGNOSIS — Z4822 Encounter for aftercare following kidney transplant: Secondary | ICD-10-CM | POA: Diagnosis not present

## 2018-09-08 DIAGNOSIS — I4891 Unspecified atrial fibrillation: Secondary | ICD-10-CM | POA: Diagnosis not present

## 2018-09-08 DIAGNOSIS — Z940 Kidney transplant status: Secondary | ICD-10-CM | POA: Diagnosis not present

## 2018-09-08 DIAGNOSIS — R600 Localized edema: Secondary | ICD-10-CM | POA: Diagnosis not present

## 2018-09-08 DIAGNOSIS — R1031 Right lower quadrant pain: Secondary | ICD-10-CM | POA: Diagnosis not present

## 2018-09-08 DIAGNOSIS — R0981 Nasal congestion: Secondary | ICD-10-CM | POA: Diagnosis not present

## 2018-09-08 DIAGNOSIS — Z7901 Long term (current) use of anticoagulants: Secondary | ICD-10-CM | POA: Diagnosis not present

## 2018-09-08 DIAGNOSIS — R011 Cardiac murmur, unspecified: Secondary | ICD-10-CM | POA: Diagnosis not present

## 2018-09-08 DIAGNOSIS — N2889 Other specified disorders of kidney and ureter: Secondary | ICD-10-CM | POA: Diagnosis not present

## 2018-09-08 DIAGNOSIS — D649 Anemia, unspecified: Secondary | ICD-10-CM | POA: Diagnosis not present

## 2018-09-08 DIAGNOSIS — N3289 Other specified disorders of bladder: Secondary | ICD-10-CM | POA: Diagnosis not present

## 2018-09-08 DIAGNOSIS — R791 Abnormal coagulation profile: Secondary | ICD-10-CM | POA: Diagnosis not present

## 2018-09-11 DIAGNOSIS — Z7901 Long term (current) use of anticoagulants: Secondary | ICD-10-CM | POA: Diagnosis not present

## 2018-09-11 DIAGNOSIS — I120 Hypertensive chronic kidney disease with stage 5 chronic kidney disease or end stage renal disease: Secondary | ICD-10-CM | POA: Diagnosis not present

## 2018-09-11 DIAGNOSIS — Z4822 Encounter for aftercare following kidney transplant: Secondary | ICD-10-CM | POA: Diagnosis not present

## 2018-09-11 DIAGNOSIS — E785 Hyperlipidemia, unspecified: Secondary | ICD-10-CM | POA: Diagnosis not present

## 2018-09-11 DIAGNOSIS — I4891 Unspecified atrial fibrillation: Secondary | ICD-10-CM | POA: Diagnosis not present

## 2018-09-11 DIAGNOSIS — Z8673 Personal history of transient ischemic attack (TIA), and cerebral infarction without residual deficits: Secondary | ICD-10-CM | POA: Diagnosis not present

## 2018-09-11 DIAGNOSIS — Z794 Long term (current) use of insulin: Secondary | ICD-10-CM | POA: Diagnosis not present

## 2018-09-11 DIAGNOSIS — N183 Chronic kidney disease, stage 3 (moderate): Secondary | ICD-10-CM | POA: Diagnosis not present

## 2018-09-11 DIAGNOSIS — R809 Proteinuria, unspecified: Secondary | ICD-10-CM | POA: Diagnosis not present

## 2018-09-11 DIAGNOSIS — I129 Hypertensive chronic kidney disease with stage 1 through stage 4 chronic kidney disease, or unspecified chronic kidney disease: Secondary | ICD-10-CM | POA: Diagnosis not present

## 2018-09-11 DIAGNOSIS — Z79899 Other long term (current) drug therapy: Secondary | ICD-10-CM | POA: Diagnosis not present

## 2018-09-11 DIAGNOSIS — Z792 Long term (current) use of antibiotics: Secondary | ICD-10-CM | POA: Diagnosis not present

## 2018-09-11 DIAGNOSIS — E119 Type 2 diabetes mellitus without complications: Secondary | ICD-10-CM | POA: Diagnosis not present

## 2018-09-11 DIAGNOSIS — I639 Cerebral infarction, unspecified: Secondary | ICD-10-CM | POA: Diagnosis not present

## 2018-09-11 DIAGNOSIS — Z940 Kidney transplant status: Secondary | ICD-10-CM | POA: Diagnosis not present

## 2018-09-11 DIAGNOSIS — M109 Gout, unspecified: Secondary | ICD-10-CM | POA: Diagnosis not present

## 2018-09-11 DIAGNOSIS — D899 Disorder involving the immune mechanism, unspecified: Secondary | ICD-10-CM | POA: Diagnosis not present

## 2018-09-11 DIAGNOSIS — D631 Anemia in chronic kidney disease: Secondary | ICD-10-CM | POA: Diagnosis not present

## 2018-09-11 DIAGNOSIS — E1122 Type 2 diabetes mellitus with diabetic chronic kidney disease: Secondary | ICD-10-CM | POA: Diagnosis not present

## 2018-09-12 DIAGNOSIS — I482 Chronic atrial fibrillation, unspecified: Secondary | ICD-10-CM | POA: Diagnosis not present

## 2018-09-12 DIAGNOSIS — Z79899 Other long term (current) drug therapy: Secondary | ICD-10-CM | POA: Diagnosis not present

## 2018-09-15 DIAGNOSIS — I700 Atherosclerosis of aorta: Secondary | ICD-10-CM | POA: Diagnosis not present

## 2018-09-15 DIAGNOSIS — N281 Cyst of kidney, acquired: Secondary | ICD-10-CM | POA: Diagnosis not present

## 2018-09-15 DIAGNOSIS — R109 Unspecified abdominal pain: Secondary | ICD-10-CM | POA: Diagnosis not present

## 2018-09-15 DIAGNOSIS — N183 Chronic kidney disease, stage 3 (moderate): Secondary | ICD-10-CM | POA: Diagnosis not present

## 2018-09-15 DIAGNOSIS — I482 Chronic atrial fibrillation, unspecified: Secondary | ICD-10-CM | POA: Diagnosis not present

## 2018-09-15 DIAGNOSIS — Z79899 Other long term (current) drug therapy: Secondary | ICD-10-CM | POA: Diagnosis not present

## 2018-09-15 DIAGNOSIS — Z940 Kidney transplant status: Secondary | ICD-10-CM | POA: Diagnosis not present

## 2018-09-17 DIAGNOSIS — I4891 Unspecified atrial fibrillation: Secondary | ICD-10-CM | POA: Diagnosis not present

## 2018-09-17 DIAGNOSIS — Z79899 Other long term (current) drug therapy: Secondary | ICD-10-CM | POA: Diagnosis not present

## 2018-09-22 DIAGNOSIS — Z79899 Other long term (current) drug therapy: Secondary | ICD-10-CM | POA: Diagnosis not present

## 2018-09-22 DIAGNOSIS — I482 Chronic atrial fibrillation, unspecified: Secondary | ICD-10-CM | POA: Diagnosis not present

## 2018-09-29 DIAGNOSIS — I4891 Unspecified atrial fibrillation: Secondary | ICD-10-CM | POA: Diagnosis not present

## 2018-09-29 DIAGNOSIS — Z79899 Other long term (current) drug therapy: Secondary | ICD-10-CM | POA: Diagnosis not present

## 2018-10-08 DIAGNOSIS — I4891 Unspecified atrial fibrillation: Secondary | ICD-10-CM | POA: Diagnosis not present

## 2018-10-08 DIAGNOSIS — Z79899 Other long term (current) drug therapy: Secondary | ICD-10-CM | POA: Diagnosis not present

## 2018-10-21 DIAGNOSIS — D044 Carcinoma in situ of skin of scalp and neck: Secondary | ICD-10-CM | POA: Diagnosis not present

## 2018-10-21 DIAGNOSIS — C44622 Squamous cell carcinoma of skin of right upper limb, including shoulder: Secondary | ICD-10-CM | POA: Diagnosis not present

## 2018-10-21 DIAGNOSIS — D0461 Carcinoma in situ of skin of right upper limb, including shoulder: Secondary | ICD-10-CM | POA: Diagnosis not present

## 2018-10-27 DIAGNOSIS — H35033 Hypertensive retinopathy, bilateral: Secondary | ICD-10-CM | POA: Diagnosis not present

## 2018-10-28 DIAGNOSIS — B351 Tinea unguium: Secondary | ICD-10-CM | POA: Diagnosis not present

## 2018-10-28 DIAGNOSIS — E1140 Type 2 diabetes mellitus with diabetic neuropathy, unspecified: Secondary | ICD-10-CM | POA: Diagnosis not present

## 2018-10-28 DIAGNOSIS — E1151 Type 2 diabetes mellitus with diabetic peripheral angiopathy without gangrene: Secondary | ICD-10-CM | POA: Diagnosis not present

## 2018-11-05 DIAGNOSIS — I10 Essential (primary) hypertension: Secondary | ICD-10-CM | POA: Diagnosis not present

## 2018-11-05 DIAGNOSIS — I120 Hypertensive chronic kidney disease with stage 5 chronic kidney disease or end stage renal disease: Secondary | ICD-10-CM | POA: Diagnosis not present

## 2018-11-05 DIAGNOSIS — E1122 Type 2 diabetes mellitus with diabetic chronic kidney disease: Secondary | ICD-10-CM | POA: Diagnosis not present

## 2018-11-05 DIAGNOSIS — Z940 Kidney transplant status: Secondary | ICD-10-CM | POA: Diagnosis not present

## 2018-11-05 DIAGNOSIS — Z792 Long term (current) use of antibiotics: Secondary | ICD-10-CM | POA: Diagnosis not present

## 2018-11-05 DIAGNOSIS — Z79899 Other long term (current) drug therapy: Secondary | ICD-10-CM | POA: Diagnosis not present

## 2018-11-05 DIAGNOSIS — F809 Developmental disorder of speech and language, unspecified: Secondary | ICD-10-CM | POA: Diagnosis not present

## 2018-11-05 DIAGNOSIS — Z13220 Encounter for screening for lipoid disorders: Secondary | ICD-10-CM | POA: Diagnosis not present

## 2018-11-05 DIAGNOSIS — D631 Anemia in chronic kidney disease: Secondary | ICD-10-CM | POA: Diagnosis not present

## 2018-11-05 DIAGNOSIS — Z794 Long term (current) use of insulin: Secondary | ICD-10-CM | POA: Diagnosis not present

## 2018-11-05 DIAGNOSIS — Z8673 Personal history of transient ischemic attack (TIA), and cerebral infarction without residual deficits: Secondary | ICD-10-CM | POA: Diagnosis not present

## 2018-11-05 DIAGNOSIS — I119 Hypertensive heart disease without heart failure: Secondary | ICD-10-CM | POA: Diagnosis not present

## 2018-11-05 DIAGNOSIS — E785 Hyperlipidemia, unspecified: Secondary | ICD-10-CM | POA: Diagnosis not present

## 2018-11-05 DIAGNOSIS — D8989 Other specified disorders involving the immune mechanism, not elsewhere classified: Secondary | ICD-10-CM | POA: Diagnosis not present

## 2018-11-05 DIAGNOSIS — Z7901 Long term (current) use of anticoagulants: Secondary | ICD-10-CM | POA: Diagnosis not present

## 2018-11-05 DIAGNOSIS — I4891 Unspecified atrial fibrillation: Secondary | ICD-10-CM | POA: Diagnosis not present

## 2018-11-05 DIAGNOSIS — Z4822 Encounter for aftercare following kidney transplant: Secondary | ICD-10-CM | POA: Diagnosis not present

## 2018-11-13 DIAGNOSIS — N2581 Secondary hyperparathyroidism of renal origin: Secondary | ICD-10-CM | POA: Diagnosis not present

## 2018-11-13 DIAGNOSIS — I10 Essential (primary) hypertension: Secondary | ICD-10-CM | POA: Diagnosis not present

## 2018-11-13 DIAGNOSIS — Z9189 Other specified personal risk factors, not elsewhere classified: Secondary | ICD-10-CM | POA: Diagnosis not present

## 2018-11-13 DIAGNOSIS — E1142 Type 2 diabetes mellitus with diabetic polyneuropathy: Secondary | ICD-10-CM | POA: Diagnosis not present

## 2018-11-13 DIAGNOSIS — Z79899 Other long term (current) drug therapy: Secondary | ICD-10-CM | POA: Diagnosis not present

## 2018-11-13 DIAGNOSIS — E1122 Type 2 diabetes mellitus with diabetic chronic kidney disease: Secondary | ICD-10-CM | POA: Diagnosis not present

## 2018-11-13 DIAGNOSIS — G6289 Other specified polyneuropathies: Secondary | ICD-10-CM | POA: Diagnosis not present

## 2018-11-13 DIAGNOSIS — E782 Mixed hyperlipidemia: Secondary | ICD-10-CM | POA: Diagnosis not present

## 2018-11-13 DIAGNOSIS — Z940 Kidney transplant status: Secondary | ICD-10-CM | POA: Diagnosis not present

## 2018-12-02 DIAGNOSIS — Z0001 Encounter for general adult medical examination with abnormal findings: Secondary | ICD-10-CM | POA: Diagnosis not present

## 2018-12-02 DIAGNOSIS — I10 Essential (primary) hypertension: Secondary | ICD-10-CM | POA: Diagnosis not present

## 2018-12-02 DIAGNOSIS — Z9181 History of falling: Secondary | ICD-10-CM | POA: Diagnosis not present

## 2018-12-02 DIAGNOSIS — Z6825 Body mass index (BMI) 25.0-25.9, adult: Secondary | ICD-10-CM | POA: Diagnosis not present

## 2018-12-11 DIAGNOSIS — I4891 Unspecified atrial fibrillation: Secondary | ICD-10-CM | POA: Diagnosis not present

## 2018-12-18 DIAGNOSIS — Z79899 Other long term (current) drug therapy: Secondary | ICD-10-CM | POA: Diagnosis not present

## 2018-12-18 DIAGNOSIS — I4891 Unspecified atrial fibrillation: Secondary | ICD-10-CM | POA: Diagnosis not present

## 2018-12-30 DIAGNOSIS — R079 Chest pain, unspecified: Secondary | ICD-10-CM | POA: Diagnosis not present

## 2018-12-30 DIAGNOSIS — I10 Essential (primary) hypertension: Secondary | ICD-10-CM | POA: Diagnosis not present

## 2018-12-30 DIAGNOSIS — Z6825 Body mass index (BMI) 25.0-25.9, adult: Secondary | ICD-10-CM | POA: Diagnosis not present

## 2019-01-01 DIAGNOSIS — Z79899 Other long term (current) drug therapy: Secondary | ICD-10-CM | POA: Diagnosis not present

## 2019-01-01 DIAGNOSIS — I4891 Unspecified atrial fibrillation: Secondary | ICD-10-CM | POA: Diagnosis not present

## 2019-01-06 DIAGNOSIS — B351 Tinea unguium: Secondary | ICD-10-CM | POA: Diagnosis not present

## 2019-01-06 DIAGNOSIS — E1151 Type 2 diabetes mellitus with diabetic peripheral angiopathy without gangrene: Secondary | ICD-10-CM | POA: Diagnosis not present

## 2019-01-06 DIAGNOSIS — E1140 Type 2 diabetes mellitus with diabetic neuropathy, unspecified: Secondary | ICD-10-CM | POA: Diagnosis not present

## 2019-01-07 DIAGNOSIS — I480 Paroxysmal atrial fibrillation: Secondary | ICD-10-CM | POA: Diagnosis not present

## 2019-01-07 DIAGNOSIS — I10 Essential (primary) hypertension: Secondary | ICD-10-CM | POA: Diagnosis not present

## 2019-01-07 DIAGNOSIS — R079 Chest pain, unspecified: Secondary | ICD-10-CM | POA: Diagnosis not present

## 2019-01-07 DIAGNOSIS — I6340 Cerebral infarction due to embolism of unspecified cerebral artery: Secondary | ICD-10-CM | POA: Diagnosis not present

## 2019-01-07 DIAGNOSIS — I208 Other forms of angina pectoris: Secondary | ICD-10-CM | POA: Diagnosis not present

## 2019-01-21 DIAGNOSIS — Z79899 Other long term (current) drug therapy: Secondary | ICD-10-CM | POA: Diagnosis not present

## 2019-01-21 DIAGNOSIS — I4891 Unspecified atrial fibrillation: Secondary | ICD-10-CM | POA: Diagnosis not present

## 2019-01-22 DIAGNOSIS — J00 Acute nasopharyngitis [common cold]: Secondary | ICD-10-CM | POA: Diagnosis not present

## 2019-01-22 DIAGNOSIS — J40 Bronchitis, not specified as acute or chronic: Secondary | ICD-10-CM | POA: Diagnosis not present

## 2019-01-30 DIAGNOSIS — I480 Paroxysmal atrial fibrillation: Secondary | ICD-10-CM | POA: Diagnosis not present

## 2019-01-30 DIAGNOSIS — I10 Essential (primary) hypertension: Secondary | ICD-10-CM | POA: Diagnosis not present

## 2019-02-03 DIAGNOSIS — R05 Cough: Secondary | ICD-10-CM | POA: Diagnosis not present

## 2019-02-03 DIAGNOSIS — Z6825 Body mass index (BMI) 25.0-25.9, adult: Secondary | ICD-10-CM | POA: Diagnosis not present

## 2019-02-10 DIAGNOSIS — M19012 Primary osteoarthritis, left shoulder: Secondary | ICD-10-CM | POA: Diagnosis not present

## 2019-02-10 DIAGNOSIS — M25512 Pain in left shoulder: Secondary | ICD-10-CM | POA: Diagnosis not present

## 2019-02-10 DIAGNOSIS — M67919 Unspecified disorder of synovium and tendon, unspecified shoulder: Secondary | ICD-10-CM | POA: Diagnosis not present

## 2019-02-10 DIAGNOSIS — M719 Bursopathy, unspecified: Secondary | ICD-10-CM | POA: Diagnosis not present

## 2019-02-11 DIAGNOSIS — R911 Solitary pulmonary nodule: Secondary | ICD-10-CM | POA: Diagnosis not present

## 2019-02-11 DIAGNOSIS — R918 Other nonspecific abnormal finding of lung field: Secondary | ICD-10-CM | POA: Diagnosis not present

## 2019-02-12 DIAGNOSIS — M21961 Unspecified acquired deformity of right lower leg: Secondary | ICD-10-CM | POA: Diagnosis not present

## 2019-02-12 DIAGNOSIS — M21962 Unspecified acquired deformity of left lower leg: Secondary | ICD-10-CM | POA: Diagnosis not present

## 2019-02-12 DIAGNOSIS — M216X1 Other acquired deformities of right foot: Secondary | ICD-10-CM | POA: Diagnosis not present

## 2019-02-18 DIAGNOSIS — I482 Chronic atrial fibrillation, unspecified: Secondary | ICD-10-CM | POA: Diagnosis not present

## 2019-02-18 DIAGNOSIS — Z79899 Other long term (current) drug therapy: Secondary | ICD-10-CM | POA: Diagnosis not present

## 2019-02-26 DIAGNOSIS — E041 Nontoxic single thyroid nodule: Secondary | ICD-10-CM | POA: Diagnosis not present

## 2019-03-03 DIAGNOSIS — E119 Type 2 diabetes mellitus without complications: Secondary | ICD-10-CM | POA: Diagnosis not present

## 2019-03-03 DIAGNOSIS — M109 Gout, unspecified: Secondary | ICD-10-CM | POA: Diagnosis not present

## 2019-03-03 DIAGNOSIS — N139 Obstructive and reflux uropathy, unspecified: Secondary | ICD-10-CM | POA: Diagnosis not present

## 2019-03-03 DIAGNOSIS — N200 Calculus of kidney: Secondary | ICD-10-CM | POA: Diagnosis not present

## 2019-03-03 DIAGNOSIS — K5900 Constipation, unspecified: Secondary | ICD-10-CM | POA: Diagnosis not present

## 2019-03-03 DIAGNOSIS — N1330 Unspecified hydronephrosis: Secondary | ICD-10-CM | POA: Diagnosis not present

## 2019-03-03 DIAGNOSIS — I10 Essential (primary) hypertension: Secondary | ICD-10-CM | POA: Diagnosis not present

## 2019-03-03 DIAGNOSIS — Z940 Kidney transplant status: Secondary | ICD-10-CM | POA: Diagnosis not present

## 2019-03-03 DIAGNOSIS — N390 Urinary tract infection, site not specified: Secondary | ICD-10-CM | POA: Diagnosis not present

## 2019-03-03 DIAGNOSIS — Z794 Long term (current) use of insulin: Secondary | ICD-10-CM | POA: Diagnosis not present

## 2019-03-03 DIAGNOSIS — R1030 Lower abdominal pain, unspecified: Secondary | ICD-10-CM | POA: Diagnosis not present

## 2019-03-03 DIAGNOSIS — J439 Emphysema, unspecified: Secondary | ICD-10-CM | POA: Diagnosis not present

## 2019-03-03 DIAGNOSIS — Z79899 Other long term (current) drug therapy: Secondary | ICD-10-CM | POA: Diagnosis not present

## 2019-03-03 DIAGNOSIS — Z7901 Long term (current) use of anticoagulants: Secondary | ICD-10-CM | POA: Diagnosis not present

## 2019-03-04 DIAGNOSIS — K5900 Constipation, unspecified: Secondary | ICD-10-CM | POA: Diagnosis not present

## 2019-03-04 DIAGNOSIS — I482 Chronic atrial fibrillation, unspecified: Secondary | ICD-10-CM | POA: Diagnosis not present

## 2019-03-04 DIAGNOSIS — N139 Obstructive and reflux uropathy, unspecified: Secondary | ICD-10-CM | POA: Diagnosis not present

## 2019-03-04 DIAGNOSIS — Z79899 Other long term (current) drug therapy: Secondary | ICD-10-CM | POA: Diagnosis not present

## 2019-03-05 DIAGNOSIS — E1122 Type 2 diabetes mellitus with diabetic chronic kidney disease: Secondary | ICD-10-CM | POA: Diagnosis not present

## 2019-03-05 DIAGNOSIS — M109 Gout, unspecified: Secondary | ICD-10-CM | POA: Diagnosis not present

## 2019-03-05 DIAGNOSIS — Z79899 Other long term (current) drug therapy: Secondary | ICD-10-CM | POA: Diagnosis not present

## 2019-03-05 DIAGNOSIS — Z794 Long term (current) use of insulin: Secondary | ICD-10-CM | POA: Diagnosis not present

## 2019-03-05 DIAGNOSIS — I4891 Unspecified atrial fibrillation: Secondary | ICD-10-CM | POA: Diagnosis not present

## 2019-03-05 DIAGNOSIS — Z7901 Long term (current) use of anticoagulants: Secondary | ICD-10-CM | POA: Diagnosis not present

## 2019-03-05 DIAGNOSIS — N2581 Secondary hyperparathyroidism of renal origin: Secondary | ICD-10-CM | POA: Diagnosis not present

## 2019-03-05 DIAGNOSIS — I120 Hypertensive chronic kidney disease with stage 5 chronic kidney disease or end stage renal disease: Secondary | ICD-10-CM | POA: Diagnosis not present

## 2019-03-05 DIAGNOSIS — Z940 Kidney transplant status: Secondary | ICD-10-CM | POA: Diagnosis not present

## 2019-03-05 DIAGNOSIS — D631 Anemia in chronic kidney disease: Secondary | ICD-10-CM | POA: Diagnosis not present

## 2019-03-05 DIAGNOSIS — Z8673 Personal history of transient ischemic attack (TIA), and cerebral infarction without residual deficits: Secondary | ICD-10-CM | POA: Diagnosis not present

## 2019-03-05 DIAGNOSIS — N186 End stage renal disease: Secondary | ICD-10-CM | POA: Diagnosis not present

## 2019-03-10 DIAGNOSIS — R338 Other retention of urine: Secondary | ICD-10-CM | POA: Diagnosis not present

## 2019-03-13 DIAGNOSIS — N401 Enlarged prostate with lower urinary tract symptoms: Secondary | ICD-10-CM | POA: Diagnosis not present

## 2019-03-13 DIAGNOSIS — N528 Other male erectile dysfunction: Secondary | ICD-10-CM | POA: Diagnosis not present

## 2019-03-13 DIAGNOSIS — R338 Other retention of urine: Secondary | ICD-10-CM | POA: Diagnosis not present

## 2019-03-18 DIAGNOSIS — I4891 Unspecified atrial fibrillation: Secondary | ICD-10-CM | POA: Diagnosis not present

## 2019-03-18 DIAGNOSIS — Z79899 Other long term (current) drug therapy: Secondary | ICD-10-CM | POA: Diagnosis not present

## 2019-03-19 DIAGNOSIS — Z87898 Personal history of other specified conditions: Secondary | ICD-10-CM | POA: Diagnosis not present

## 2019-03-19 DIAGNOSIS — N528 Other male erectile dysfunction: Secondary | ICD-10-CM | POA: Diagnosis not present

## 2019-03-19 DIAGNOSIS — N401 Enlarged prostate with lower urinary tract symptoms: Secondary | ICD-10-CM | POA: Diagnosis not present

## 2019-03-19 DIAGNOSIS — R3914 Feeling of incomplete bladder emptying: Secondary | ICD-10-CM | POA: Diagnosis not present

## 2019-03-25 DIAGNOSIS — E1151 Type 2 diabetes mellitus with diabetic peripheral angiopathy without gangrene: Secondary | ICD-10-CM | POA: Diagnosis not present

## 2019-03-25 DIAGNOSIS — B351 Tinea unguium: Secondary | ICD-10-CM | POA: Diagnosis not present

## 2019-03-25 DIAGNOSIS — E1140 Type 2 diabetes mellitus with diabetic neuropathy, unspecified: Secondary | ICD-10-CM | POA: Diagnosis not present

## 2019-03-31 DIAGNOSIS — I482 Chronic atrial fibrillation, unspecified: Secondary | ICD-10-CM | POA: Diagnosis not present

## 2019-03-31 DIAGNOSIS — G4733 Obstructive sleep apnea (adult) (pediatric): Secondary | ICD-10-CM | POA: Diagnosis not present

## 2019-03-31 DIAGNOSIS — Z940 Kidney transplant status: Secondary | ICD-10-CM | POA: Diagnosis not present

## 2019-03-31 DIAGNOSIS — Z9189 Other specified personal risk factors, not elsewhere classified: Secondary | ICD-10-CM | POA: Diagnosis not present

## 2019-03-31 DIAGNOSIS — Z1331 Encounter for screening for depression: Secondary | ICD-10-CM | POA: Diagnosis not present

## 2019-03-31 DIAGNOSIS — I69321 Dysphasia following cerebral infarction: Secondary | ICD-10-CM | POA: Diagnosis not present

## 2019-03-31 DIAGNOSIS — E1142 Type 2 diabetes mellitus with diabetic polyneuropathy: Secondary | ICD-10-CM | POA: Diagnosis not present

## 2019-03-31 DIAGNOSIS — G6289 Other specified polyneuropathies: Secondary | ICD-10-CM | POA: Diagnosis not present

## 2019-03-31 DIAGNOSIS — Z1389 Encounter for screening for other disorder: Secondary | ICD-10-CM | POA: Diagnosis not present

## 2019-03-31 DIAGNOSIS — D649 Anemia, unspecified: Secondary | ICD-10-CM | POA: Diagnosis not present

## 2019-03-31 DIAGNOSIS — E782 Mixed hyperlipidemia: Secondary | ICD-10-CM | POA: Diagnosis not present

## 2019-03-31 DIAGNOSIS — M109 Gout, unspecified: Secondary | ICD-10-CM | POA: Diagnosis not present

## 2019-03-31 DIAGNOSIS — I10 Essential (primary) hypertension: Secondary | ICD-10-CM | POA: Diagnosis not present

## 2019-03-31 DIAGNOSIS — I4891 Unspecified atrial fibrillation: Secondary | ICD-10-CM | POA: Diagnosis not present

## 2019-03-31 DIAGNOSIS — E1122 Type 2 diabetes mellitus with diabetic chronic kidney disease: Secondary | ICD-10-CM | POA: Diagnosis not present

## 2019-04-02 DIAGNOSIS — E782 Mixed hyperlipidemia: Secondary | ICD-10-CM | POA: Diagnosis not present

## 2019-04-02 DIAGNOSIS — I10 Essential (primary) hypertension: Secondary | ICD-10-CM | POA: Diagnosis not present

## 2019-04-15 DIAGNOSIS — I4891 Unspecified atrial fibrillation: Secondary | ICD-10-CM | POA: Diagnosis not present

## 2019-05-02 DIAGNOSIS — I10 Essential (primary) hypertension: Secondary | ICD-10-CM | POA: Diagnosis not present

## 2019-05-02 DIAGNOSIS — E785 Hyperlipidemia, unspecified: Secondary | ICD-10-CM | POA: Diagnosis not present

## 2019-05-05 DIAGNOSIS — J339 Nasal polyp, unspecified: Secondary | ICD-10-CM | POA: Diagnosis not present

## 2019-05-05 DIAGNOSIS — J320 Chronic maxillary sinusitis: Secondary | ICD-10-CM | POA: Diagnosis not present

## 2019-05-05 DIAGNOSIS — R0981 Nasal congestion: Secondary | ICD-10-CM | POA: Diagnosis not present

## 2019-05-08 DIAGNOSIS — N528 Other male erectile dysfunction: Secondary | ICD-10-CM | POA: Diagnosis not present

## 2019-05-08 DIAGNOSIS — Z87898 Personal history of other specified conditions: Secondary | ICD-10-CM | POA: Diagnosis not present

## 2019-05-08 DIAGNOSIS — N401 Enlarged prostate with lower urinary tract symptoms: Secondary | ICD-10-CM | POA: Diagnosis not present

## 2019-05-08 DIAGNOSIS — R3914 Feeling of incomplete bladder emptying: Secondary | ICD-10-CM | POA: Diagnosis not present

## 2019-05-11 DIAGNOSIS — J339 Nasal polyp, unspecified: Secondary | ICD-10-CM | POA: Diagnosis not present

## 2019-05-11 DIAGNOSIS — J320 Chronic maxillary sinusitis: Secondary | ICD-10-CM | POA: Diagnosis not present

## 2019-05-13 DIAGNOSIS — I4891 Unspecified atrial fibrillation: Secondary | ICD-10-CM | POA: Diagnosis not present

## 2019-05-15 DIAGNOSIS — Z79899 Other long term (current) drug therapy: Secondary | ICD-10-CM | POA: Diagnosis not present

## 2019-05-15 DIAGNOSIS — R0981 Nasal congestion: Secondary | ICD-10-CM | POA: Diagnosis not present

## 2019-05-15 DIAGNOSIS — J339 Nasal polyp, unspecified: Secondary | ICD-10-CM | POA: Diagnosis not present

## 2019-05-15 DIAGNOSIS — Z940 Kidney transplant status: Secondary | ICD-10-CM | POA: Diagnosis not present

## 2019-05-15 DIAGNOSIS — J320 Chronic maxillary sinusitis: Secondary | ICD-10-CM | POA: Diagnosis not present

## 2019-05-15 DIAGNOSIS — I4891 Unspecified atrial fibrillation: Secondary | ICD-10-CM | POA: Diagnosis not present

## 2019-05-20 DIAGNOSIS — Z79899 Other long term (current) drug therapy: Secondary | ICD-10-CM | POA: Diagnosis not present

## 2019-05-20 DIAGNOSIS — I482 Chronic atrial fibrillation, unspecified: Secondary | ICD-10-CM | POA: Diagnosis not present

## 2019-05-27 DIAGNOSIS — I4891 Unspecified atrial fibrillation: Secondary | ICD-10-CM | POA: Diagnosis not present

## 2019-05-27 DIAGNOSIS — Z79899 Other long term (current) drug therapy: Secondary | ICD-10-CM | POA: Diagnosis not present

## 2019-05-28 DIAGNOSIS — I4891 Unspecified atrial fibrillation: Secondary | ICD-10-CM | POA: Diagnosis not present

## 2019-05-28 DIAGNOSIS — Z6823 Body mass index (BMI) 23.0-23.9, adult: Secondary | ICD-10-CM | POA: Diagnosis not present

## 2019-06-02 DIAGNOSIS — E782 Mixed hyperlipidemia: Secondary | ICD-10-CM | POA: Diagnosis not present

## 2019-06-02 DIAGNOSIS — I10 Essential (primary) hypertension: Secondary | ICD-10-CM | POA: Diagnosis not present

## 2019-06-02 DIAGNOSIS — E1165 Type 2 diabetes mellitus with hyperglycemia: Secondary | ICD-10-CM | POA: Diagnosis not present

## 2019-06-15 DIAGNOSIS — E1140 Type 2 diabetes mellitus with diabetic neuropathy, unspecified: Secondary | ICD-10-CM | POA: Diagnosis not present

## 2019-06-15 DIAGNOSIS — B351 Tinea unguium: Secondary | ICD-10-CM | POA: Diagnosis not present

## 2019-06-15 DIAGNOSIS — E1151 Type 2 diabetes mellitus with diabetic peripheral angiopathy without gangrene: Secondary | ICD-10-CM | POA: Diagnosis not present

## 2019-06-16 DIAGNOSIS — I482 Chronic atrial fibrillation, unspecified: Secondary | ICD-10-CM | POA: Diagnosis not present

## 2019-06-18 ENCOUNTER — Other Ambulatory Visit (HOSPITAL_COMMUNITY): Payer: Self-pay | Admitting: Nephrology

## 2019-06-18 DIAGNOSIS — L570 Actinic keratosis: Secondary | ICD-10-CM | POA: Diagnosis not present

## 2019-06-18 DIAGNOSIS — D044 Carcinoma in situ of skin of scalp and neck: Secondary | ICD-10-CM | POA: Diagnosis not present

## 2019-06-18 DIAGNOSIS — D485 Neoplasm of uncertain behavior of skin: Secondary | ICD-10-CM | POA: Diagnosis not present

## 2019-06-18 DIAGNOSIS — Z940 Kidney transplant status: Secondary | ICD-10-CM

## 2019-06-18 DIAGNOSIS — R1031 Right lower quadrant pain: Secondary | ICD-10-CM

## 2019-06-23 ENCOUNTER — Ambulatory Visit (HOSPITAL_COMMUNITY): Payer: Medicare Other

## 2019-06-23 ENCOUNTER — Encounter (HOSPITAL_COMMUNITY): Payer: Self-pay

## 2019-06-24 DIAGNOSIS — N183 Chronic kidney disease, stage 3 (moderate): Secondary | ICD-10-CM | POA: Diagnosis not present

## 2019-06-24 DIAGNOSIS — D519 Vitamin B12 deficiency anemia, unspecified: Secondary | ICD-10-CM | POA: Diagnosis not present

## 2019-06-24 DIAGNOSIS — Z940 Kidney transplant status: Secondary | ICD-10-CM | POA: Diagnosis not present

## 2019-06-24 DIAGNOSIS — I4891 Unspecified atrial fibrillation: Secondary | ICD-10-CM | POA: Diagnosis not present

## 2019-06-24 DIAGNOSIS — E1165 Type 2 diabetes mellitus with hyperglycemia: Secondary | ICD-10-CM | POA: Diagnosis not present

## 2019-06-24 DIAGNOSIS — E782 Mixed hyperlipidemia: Secondary | ICD-10-CM | POA: Diagnosis not present

## 2019-06-24 DIAGNOSIS — D649 Anemia, unspecified: Secondary | ICD-10-CM | POA: Diagnosis not present

## 2019-06-24 DIAGNOSIS — I482 Chronic atrial fibrillation, unspecified: Secondary | ICD-10-CM | POA: Diagnosis not present

## 2019-06-24 DIAGNOSIS — I10 Essential (primary) hypertension: Secondary | ICD-10-CM | POA: Diagnosis not present

## 2019-06-24 DIAGNOSIS — E1122 Type 2 diabetes mellitus with diabetic chronic kidney disease: Secondary | ICD-10-CM | POA: Diagnosis not present

## 2019-06-25 DIAGNOSIS — J3489 Other specified disorders of nose and nasal sinuses: Secondary | ICD-10-CM | POA: Diagnosis not present

## 2019-06-25 DIAGNOSIS — Z940 Kidney transplant status: Secondary | ICD-10-CM | POA: Diagnosis not present

## 2019-06-25 DIAGNOSIS — J320 Chronic maxillary sinusitis: Secondary | ICD-10-CM | POA: Diagnosis not present

## 2019-06-25 DIAGNOSIS — R0981 Nasal congestion: Secondary | ICD-10-CM | POA: Diagnosis not present

## 2019-06-25 DIAGNOSIS — Z7901 Long term (current) use of anticoagulants: Secondary | ICD-10-CM | POA: Diagnosis not present

## 2019-06-25 DIAGNOSIS — Z888 Allergy status to other drugs, medicaments and biological substances status: Secondary | ICD-10-CM | POA: Diagnosis not present

## 2019-06-25 DIAGNOSIS — G4730 Sleep apnea, unspecified: Secondary | ICD-10-CM | POA: Diagnosis not present

## 2019-06-25 DIAGNOSIS — Z87891 Personal history of nicotine dependence: Secondary | ICD-10-CM | POA: Diagnosis not present

## 2019-06-25 DIAGNOSIS — I10 Essential (primary) hypertension: Secondary | ICD-10-CM | POA: Diagnosis not present

## 2019-06-25 DIAGNOSIS — Z794 Long term (current) use of insulin: Secondary | ICD-10-CM | POA: Diagnosis not present

## 2019-06-25 DIAGNOSIS — E119 Type 2 diabetes mellitus without complications: Secondary | ICD-10-CM | POA: Diagnosis not present

## 2019-06-25 DIAGNOSIS — J338 Other polyp of sinus: Secondary | ICD-10-CM | POA: Diagnosis not present

## 2019-06-25 DIAGNOSIS — I4891 Unspecified atrial fibrillation: Secondary | ICD-10-CM | POA: Diagnosis not present

## 2019-06-25 DIAGNOSIS — H9190 Unspecified hearing loss, unspecified ear: Secondary | ICD-10-CM | POA: Diagnosis not present

## 2019-06-25 DIAGNOSIS — N289 Disorder of kidney and ureter, unspecified: Secondary | ICD-10-CM | POA: Diagnosis not present

## 2019-06-25 DIAGNOSIS — Z8673 Personal history of transient ischemic attack (TIA), and cerebral infarction without residual deficits: Secondary | ICD-10-CM | POA: Diagnosis not present

## 2019-06-25 DIAGNOSIS — Z79899 Other long term (current) drug therapy: Secondary | ICD-10-CM | POA: Diagnosis not present

## 2019-06-26 DIAGNOSIS — Z1159 Encounter for screening for other viral diseases: Secondary | ICD-10-CM | POA: Diagnosis not present

## 2019-06-26 DIAGNOSIS — Z01818 Encounter for other preprocedural examination: Secondary | ICD-10-CM | POA: Diagnosis not present

## 2019-06-26 DIAGNOSIS — M19011 Primary osteoarthritis, right shoulder: Secondary | ICD-10-CM | POA: Diagnosis not present

## 2019-06-30 DIAGNOSIS — Z7901 Long term (current) use of anticoagulants: Secondary | ICD-10-CM | POA: Diagnosis not present

## 2019-07-01 DIAGNOSIS — N189 Chronic kidney disease, unspecified: Secondary | ICD-10-CM | POA: Diagnosis not present

## 2019-07-01 DIAGNOSIS — R0981 Nasal congestion: Secondary | ICD-10-CM | POA: Diagnosis not present

## 2019-07-01 DIAGNOSIS — E119 Type 2 diabetes mellitus without complications: Secondary | ICD-10-CM | POA: Diagnosis not present

## 2019-07-01 DIAGNOSIS — J338 Other polyp of sinus: Secondary | ICD-10-CM | POA: Diagnosis not present

## 2019-07-01 DIAGNOSIS — J339 Nasal polyp, unspecified: Secondary | ICD-10-CM | POA: Diagnosis not present

## 2019-07-01 DIAGNOSIS — J320 Chronic maxillary sinusitis: Secondary | ICD-10-CM | POA: Diagnosis not present

## 2019-07-01 DIAGNOSIS — J3489 Other specified disorders of nose and nasal sinuses: Secondary | ICD-10-CM | POA: Diagnosis not present

## 2019-07-01 DIAGNOSIS — I10 Essential (primary) hypertension: Secondary | ICD-10-CM | POA: Diagnosis not present

## 2019-07-01 DIAGNOSIS — J330 Polyp of nasal cavity: Secondary | ICD-10-CM | POA: Diagnosis not present

## 2019-07-01 DIAGNOSIS — E1122 Type 2 diabetes mellitus with diabetic chronic kidney disease: Secondary | ICD-10-CM | POA: Diagnosis not present

## 2019-07-06 DIAGNOSIS — Z79899 Other long term (current) drug therapy: Secondary | ICD-10-CM | POA: Diagnosis not present

## 2019-07-06 DIAGNOSIS — I4891 Unspecified atrial fibrillation: Secondary | ICD-10-CM | POA: Diagnosis not present

## 2019-07-08 DIAGNOSIS — I482 Chronic atrial fibrillation, unspecified: Secondary | ICD-10-CM | POA: Diagnosis not present

## 2019-07-08 DIAGNOSIS — Z79899 Other long term (current) drug therapy: Secondary | ICD-10-CM | POA: Diagnosis not present

## 2019-07-09 DIAGNOSIS — J320 Chronic maxillary sinusitis: Secondary | ICD-10-CM | POA: Diagnosis not present

## 2019-07-10 DIAGNOSIS — Z79899 Other long term (current) drug therapy: Secondary | ICD-10-CM | POA: Diagnosis not present

## 2019-07-10 DIAGNOSIS — I482 Chronic atrial fibrillation, unspecified: Secondary | ICD-10-CM | POA: Diagnosis not present

## 2019-07-13 DIAGNOSIS — Z79899 Other long term (current) drug therapy: Secondary | ICD-10-CM | POA: Diagnosis not present

## 2019-07-13 DIAGNOSIS — I482 Chronic atrial fibrillation, unspecified: Secondary | ICD-10-CM | POA: Diagnosis not present

## 2019-07-16 DIAGNOSIS — L570 Actinic keratosis: Secondary | ICD-10-CM | POA: Diagnosis not present

## 2019-07-16 DIAGNOSIS — D044 Carcinoma in situ of skin of scalp and neck: Secondary | ICD-10-CM | POA: Diagnosis not present

## 2019-07-23 DIAGNOSIS — J320 Chronic maxillary sinusitis: Secondary | ICD-10-CM | POA: Diagnosis not present

## 2019-07-28 DIAGNOSIS — I482 Chronic atrial fibrillation, unspecified: Secondary | ICD-10-CM | POA: Diagnosis not present

## 2019-08-03 DIAGNOSIS — I10 Essential (primary) hypertension: Secondary | ICD-10-CM | POA: Diagnosis not present

## 2019-08-03 DIAGNOSIS — Z792 Long term (current) use of antibiotics: Secondary | ICD-10-CM | POA: Diagnosis not present

## 2019-08-03 DIAGNOSIS — Z4822 Encounter for aftercare following kidney transplant: Secondary | ICD-10-CM | POA: Diagnosis not present

## 2019-08-03 DIAGNOSIS — D649 Anemia, unspecified: Secondary | ICD-10-CM | POA: Diagnosis not present

## 2019-08-03 DIAGNOSIS — N2581 Secondary hyperparathyroidism of renal origin: Secondary | ICD-10-CM | POA: Diagnosis not present

## 2019-08-03 DIAGNOSIS — I4891 Unspecified atrial fibrillation: Secondary | ICD-10-CM | POA: Diagnosis not present

## 2019-08-03 DIAGNOSIS — M109 Gout, unspecified: Secondary | ICD-10-CM | POA: Diagnosis not present

## 2019-08-03 DIAGNOSIS — R600 Localized edema: Secondary | ICD-10-CM | POA: Diagnosis not present

## 2019-08-03 DIAGNOSIS — Z940 Kidney transplant status: Secondary | ICD-10-CM | POA: Diagnosis not present

## 2019-08-03 DIAGNOSIS — Z8673 Personal history of transient ischemic attack (TIA), and cerebral infarction without residual deficits: Secondary | ICD-10-CM | POA: Diagnosis not present

## 2019-08-03 DIAGNOSIS — Z79899 Other long term (current) drug therapy: Secondary | ICD-10-CM | POA: Diagnosis not present

## 2019-08-03 DIAGNOSIS — Z7901 Long term (current) use of anticoagulants: Secondary | ICD-10-CM | POA: Diagnosis not present

## 2019-08-03 DIAGNOSIS — Z794 Long term (current) use of insulin: Secondary | ICD-10-CM | POA: Diagnosis not present

## 2019-08-03 DIAGNOSIS — R1031 Right lower quadrant pain: Secondary | ICD-10-CM | POA: Diagnosis not present

## 2019-08-03 DIAGNOSIS — N401 Enlarged prostate with lower urinary tract symptoms: Secondary | ICD-10-CM | POA: Diagnosis not present

## 2019-08-03 DIAGNOSIS — I129 Hypertensive chronic kidney disease with stage 1 through stage 4 chronic kidney disease, or unspecified chronic kidney disease: Secondary | ICD-10-CM | POA: Diagnosis not present

## 2019-08-03 DIAGNOSIS — E119 Type 2 diabetes mellitus without complications: Secondary | ICD-10-CM | POA: Diagnosis not present

## 2019-08-03 DIAGNOSIS — Z7982 Long term (current) use of aspirin: Secondary | ICD-10-CM | POA: Diagnosis not present

## 2019-08-03 DIAGNOSIS — R338 Other retention of urine: Secondary | ICD-10-CM | POA: Diagnosis not present

## 2019-08-03 DIAGNOSIS — Z7952 Long term (current) use of systemic steroids: Secondary | ICD-10-CM | POA: Diagnosis not present

## 2019-08-03 DIAGNOSIS — E785 Hyperlipidemia, unspecified: Secondary | ICD-10-CM | POA: Diagnosis not present

## 2019-08-03 DIAGNOSIS — N183 Chronic kidney disease, stage 3 (moderate): Secondary | ICD-10-CM | POA: Diagnosis not present

## 2019-08-04 DIAGNOSIS — Z87898 Personal history of other specified conditions: Secondary | ICD-10-CM | POA: Diagnosis not present

## 2019-08-04 DIAGNOSIS — R635 Abnormal weight gain: Secondary | ICD-10-CM | POA: Diagnosis not present

## 2019-08-04 DIAGNOSIS — R3914 Feeling of incomplete bladder emptying: Secondary | ICD-10-CM | POA: Diagnosis not present

## 2019-08-04 DIAGNOSIS — N528 Other male erectile dysfunction: Secondary | ICD-10-CM | POA: Diagnosis not present

## 2019-08-04 DIAGNOSIS — N401 Enlarged prostate with lower urinary tract symptoms: Secondary | ICD-10-CM | POA: Diagnosis not present

## 2019-08-05 DIAGNOSIS — I482 Chronic atrial fibrillation, unspecified: Secondary | ICD-10-CM | POA: Diagnosis not present

## 2019-08-17 DIAGNOSIS — J320 Chronic maxillary sinusitis: Secondary | ICD-10-CM | POA: Diagnosis not present

## 2019-08-19 DIAGNOSIS — I482 Chronic atrial fibrillation, unspecified: Secondary | ICD-10-CM | POA: Diagnosis not present

## 2019-08-19 DIAGNOSIS — Z79899 Other long term (current) drug therapy: Secondary | ICD-10-CM | POA: Diagnosis not present

## 2019-08-25 DIAGNOSIS — I700 Atherosclerosis of aorta: Secondary | ICD-10-CM | POA: Diagnosis not present

## 2019-08-25 DIAGNOSIS — J9811 Atelectasis: Secondary | ICD-10-CM | POA: Diagnosis not present

## 2019-08-25 DIAGNOSIS — R911 Solitary pulmonary nodule: Secondary | ICD-10-CM | POA: Diagnosis not present

## 2019-09-02 DIAGNOSIS — E785 Hyperlipidemia, unspecified: Secondary | ICD-10-CM | POA: Diagnosis not present

## 2019-09-02 DIAGNOSIS — I10 Essential (primary) hypertension: Secondary | ICD-10-CM | POA: Diagnosis not present

## 2019-09-02 DIAGNOSIS — I4891 Unspecified atrial fibrillation: Secondary | ICD-10-CM | POA: Diagnosis not present

## 2019-09-02 DIAGNOSIS — Z79899 Other long term (current) drug therapy: Secondary | ICD-10-CM | POA: Diagnosis not present

## 2019-09-07 DIAGNOSIS — E1140 Type 2 diabetes mellitus with diabetic neuropathy, unspecified: Secondary | ICD-10-CM | POA: Diagnosis not present

## 2019-09-07 DIAGNOSIS — B351 Tinea unguium: Secondary | ICD-10-CM | POA: Diagnosis not present

## 2019-09-07 DIAGNOSIS — E1151 Type 2 diabetes mellitus with diabetic peripheral angiopathy without gangrene: Secondary | ICD-10-CM | POA: Diagnosis not present

## 2019-09-18 DIAGNOSIS — Z794 Long term (current) use of insulin: Secondary | ICD-10-CM | POA: Diagnosis not present

## 2019-09-18 DIAGNOSIS — E119 Type 2 diabetes mellitus without complications: Secondary | ICD-10-CM | POA: Diagnosis not present

## 2019-09-18 DIAGNOSIS — H40023 Open angle with borderline findings, high risk, bilateral: Secondary | ICD-10-CM | POA: Diagnosis not present

## 2019-09-30 DIAGNOSIS — I4891 Unspecified atrial fibrillation: Secondary | ICD-10-CM | POA: Diagnosis not present

## 2019-09-30 DIAGNOSIS — M109 Gout, unspecified: Secondary | ICD-10-CM | POA: Diagnosis not present

## 2019-09-30 DIAGNOSIS — M25562 Pain in left knee: Secondary | ICD-10-CM | POA: Diagnosis not present

## 2019-09-30 DIAGNOSIS — J449 Chronic obstructive pulmonary disease, unspecified: Secondary | ICD-10-CM | POA: Diagnosis not present

## 2019-09-30 DIAGNOSIS — M11262 Other chondrocalcinosis, left knee: Secondary | ICD-10-CM | POA: Diagnosis not present

## 2019-09-30 DIAGNOSIS — M1712 Unilateral primary osteoarthritis, left knee: Secondary | ICD-10-CM | POA: Diagnosis not present

## 2019-09-30 DIAGNOSIS — I10 Essential (primary) hypertension: Secondary | ICD-10-CM | POA: Diagnosis not present

## 2019-09-30 DIAGNOSIS — E1122 Type 2 diabetes mellitus with diabetic chronic kidney disease: Secondary | ICD-10-CM | POA: Diagnosis not present

## 2019-09-30 DIAGNOSIS — Z79899 Other long term (current) drug therapy: Secondary | ICD-10-CM | POA: Diagnosis not present

## 2019-09-30 DIAGNOSIS — Z940 Kidney transplant status: Secondary | ICD-10-CM | POA: Diagnosis not present

## 2019-10-02 DIAGNOSIS — I482 Chronic atrial fibrillation, unspecified: Secondary | ICD-10-CM | POA: Diagnosis not present

## 2019-10-02 DIAGNOSIS — E1165 Type 2 diabetes mellitus with hyperglycemia: Secondary | ICD-10-CM | POA: Diagnosis not present

## 2019-10-02 DIAGNOSIS — E782 Mixed hyperlipidemia: Secondary | ICD-10-CM | POA: Diagnosis not present

## 2019-10-08 DIAGNOSIS — N183 Chronic kidney disease, stage 3 unspecified: Secondary | ICD-10-CM | POA: Diagnosis not present

## 2019-10-08 DIAGNOSIS — Z79899 Other long term (current) drug therapy: Secondary | ICD-10-CM | POA: Diagnosis not present

## 2019-10-08 DIAGNOSIS — E1122 Type 2 diabetes mellitus with diabetic chronic kidney disease: Secondary | ICD-10-CM | POA: Diagnosis not present

## 2019-10-08 DIAGNOSIS — Z888 Allergy status to other drugs, medicaments and biological substances status: Secondary | ICD-10-CM | POA: Diagnosis not present

## 2019-10-08 DIAGNOSIS — E785 Hyperlipidemia, unspecified: Secondary | ICD-10-CM | POA: Diagnosis not present

## 2019-10-08 DIAGNOSIS — Z940 Kidney transplant status: Secondary | ICD-10-CM | POA: Diagnosis not present

## 2019-10-08 DIAGNOSIS — N2581 Secondary hyperparathyroidism of renal origin: Secondary | ICD-10-CM | POA: Diagnosis not present

## 2019-10-08 DIAGNOSIS — M109 Gout, unspecified: Secondary | ICD-10-CM | POA: Diagnosis not present

## 2019-10-08 DIAGNOSIS — R600 Localized edema: Secondary | ICD-10-CM | POA: Diagnosis not present

## 2019-10-08 DIAGNOSIS — I4891 Unspecified atrial fibrillation: Secondary | ICD-10-CM | POA: Diagnosis not present

## 2019-10-08 DIAGNOSIS — N401 Enlarged prostate with lower urinary tract symptoms: Secondary | ICD-10-CM | POA: Diagnosis not present

## 2019-10-08 DIAGNOSIS — Z8673 Personal history of transient ischemic attack (TIA), and cerebral infarction without residual deficits: Secondary | ICD-10-CM | POA: Diagnosis not present

## 2019-10-08 DIAGNOSIS — Z7982 Long term (current) use of aspirin: Secondary | ICD-10-CM | POA: Diagnosis not present

## 2019-10-08 DIAGNOSIS — I129 Hypertensive chronic kidney disease with stage 1 through stage 4 chronic kidney disease, or unspecified chronic kidney disease: Secondary | ICD-10-CM | POA: Diagnosis not present

## 2019-10-08 DIAGNOSIS — N1832 Chronic kidney disease, stage 3b: Secondary | ICD-10-CM | POA: Diagnosis not present

## 2019-10-08 DIAGNOSIS — Z7952 Long term (current) use of systemic steroids: Secondary | ICD-10-CM | POA: Diagnosis not present

## 2019-10-08 DIAGNOSIS — N138 Other obstructive and reflux uropathy: Secondary | ICD-10-CM | POA: Diagnosis not present

## 2019-10-08 DIAGNOSIS — Z794 Long term (current) use of insulin: Secondary | ICD-10-CM | POA: Diagnosis not present

## 2019-10-08 DIAGNOSIS — Z7901 Long term (current) use of anticoagulants: Secondary | ICD-10-CM | POA: Diagnosis not present

## 2019-10-19 DIAGNOSIS — J339 Nasal polyp, unspecified: Secondary | ICD-10-CM | POA: Diagnosis not present

## 2019-10-19 DIAGNOSIS — J320 Chronic maxillary sinusitis: Secondary | ICD-10-CM | POA: Diagnosis not present

## 2019-10-20 DIAGNOSIS — D049 Carcinoma in situ of skin, unspecified: Secondary | ICD-10-CM | POA: Diagnosis not present

## 2019-10-20 DIAGNOSIS — L570 Actinic keratosis: Secondary | ICD-10-CM | POA: Diagnosis not present

## 2019-10-28 ENCOUNTER — Other Ambulatory Visit: Payer: Self-pay

## 2019-10-28 DIAGNOSIS — Z79899 Other long term (current) drug therapy: Secondary | ICD-10-CM | POA: Diagnosis not present

## 2019-10-28 DIAGNOSIS — I4891 Unspecified atrial fibrillation: Secondary | ICD-10-CM | POA: Diagnosis not present

## 2019-11-10 DIAGNOSIS — Z794 Long term (current) use of insulin: Secondary | ICD-10-CM | POA: Diagnosis not present

## 2019-11-10 DIAGNOSIS — D849 Immunodeficiency, unspecified: Secondary | ICD-10-CM | POA: Diagnosis not present

## 2019-11-10 DIAGNOSIS — E119 Type 2 diabetes mellitus without complications: Secondary | ICD-10-CM | POA: Diagnosis not present

## 2019-11-10 DIAGNOSIS — Z940 Kidney transplant status: Secondary | ICD-10-CM | POA: Diagnosis not present

## 2019-11-10 DIAGNOSIS — Z792 Long term (current) use of antibiotics: Secondary | ICD-10-CM | POA: Diagnosis not present

## 2019-11-10 DIAGNOSIS — Z4822 Encounter for aftercare following kidney transplant: Secondary | ICD-10-CM | POA: Diagnosis not present

## 2019-11-10 DIAGNOSIS — I10 Essential (primary) hypertension: Secondary | ICD-10-CM | POA: Diagnosis not present

## 2019-11-10 DIAGNOSIS — E785 Hyperlipidemia, unspecified: Secondary | ICD-10-CM | POA: Diagnosis not present

## 2019-11-10 DIAGNOSIS — Z79899 Other long term (current) drug therapy: Secondary | ICD-10-CM | POA: Diagnosis not present

## 2019-11-10 DIAGNOSIS — Z7901 Long term (current) use of anticoagulants: Secondary | ICD-10-CM | POA: Diagnosis not present

## 2019-11-10 DIAGNOSIS — I482 Chronic atrial fibrillation, unspecified: Secondary | ICD-10-CM | POA: Diagnosis not present

## 2019-11-10 DIAGNOSIS — Z87891 Personal history of nicotine dependence: Secondary | ICD-10-CM | POA: Diagnosis not present

## 2019-11-11 DIAGNOSIS — I4891 Unspecified atrial fibrillation: Secondary | ICD-10-CM | POA: Diagnosis not present

## 2019-11-11 DIAGNOSIS — Z79899 Other long term (current) drug therapy: Secondary | ICD-10-CM | POA: Diagnosis not present

## 2019-12-03 DIAGNOSIS — E782 Mixed hyperlipidemia: Secondary | ICD-10-CM | POA: Diagnosis not present

## 2019-12-03 DIAGNOSIS — I10 Essential (primary) hypertension: Secondary | ICD-10-CM | POA: Diagnosis not present

## 2019-12-15 DIAGNOSIS — E1142 Type 2 diabetes mellitus with diabetic polyneuropathy: Secondary | ICD-10-CM | POA: Diagnosis not present

## 2019-12-15 DIAGNOSIS — J449 Chronic obstructive pulmonary disease, unspecified: Secondary | ICD-10-CM | POA: Diagnosis not present

## 2019-12-15 DIAGNOSIS — Z79899 Other long term (current) drug therapy: Secondary | ICD-10-CM | POA: Diagnosis not present

## 2019-12-15 DIAGNOSIS — R4582 Worries: Secondary | ICD-10-CM | POA: Diagnosis not present

## 2019-12-15 DIAGNOSIS — E1122 Type 2 diabetes mellitus with diabetic chronic kidney disease: Secondary | ICD-10-CM | POA: Diagnosis not present

## 2019-12-15 DIAGNOSIS — I10 Essential (primary) hypertension: Secondary | ICD-10-CM | POA: Diagnosis not present

## 2019-12-30 DIAGNOSIS — Z79899 Other long term (current) drug therapy: Secondary | ICD-10-CM | POA: Diagnosis not present

## 2019-12-30 DIAGNOSIS — I482 Chronic atrial fibrillation, unspecified: Secondary | ICD-10-CM | POA: Diagnosis not present

## 2020-01-01 DIAGNOSIS — I10 Essential (primary) hypertension: Secondary | ICD-10-CM | POA: Diagnosis not present

## 2020-01-01 DIAGNOSIS — E7849 Other hyperlipidemia: Secondary | ICD-10-CM | POA: Diagnosis not present

## 2020-01-27 DIAGNOSIS — Z23 Encounter for immunization: Secondary | ICD-10-CM | POA: Diagnosis not present

## 2020-01-27 DIAGNOSIS — Z79899 Other long term (current) drug therapy: Secondary | ICD-10-CM | POA: Diagnosis not present

## 2020-01-27 DIAGNOSIS — I482 Chronic atrial fibrillation, unspecified: Secondary | ICD-10-CM | POA: Diagnosis not present

## 2020-01-29 DIAGNOSIS — E7849 Other hyperlipidemia: Secondary | ICD-10-CM | POA: Diagnosis not present

## 2020-01-29 DIAGNOSIS — I10 Essential (primary) hypertension: Secondary | ICD-10-CM | POA: Diagnosis not present

## 2020-02-08 DIAGNOSIS — E1151 Type 2 diabetes mellitus with diabetic peripheral angiopathy without gangrene: Secondary | ICD-10-CM | POA: Diagnosis not present

## 2020-02-08 DIAGNOSIS — B351 Tinea unguium: Secondary | ICD-10-CM | POA: Diagnosis not present

## 2020-02-08 DIAGNOSIS — E1140 Type 2 diabetes mellitus with diabetic neuropathy, unspecified: Secondary | ICD-10-CM | POA: Diagnosis not present

## 2020-02-09 DIAGNOSIS — R3914 Feeling of incomplete bladder emptying: Secondary | ICD-10-CM | POA: Diagnosis not present

## 2020-02-09 DIAGNOSIS — N528 Other male erectile dysfunction: Secondary | ICD-10-CM | POA: Diagnosis not present

## 2020-02-09 DIAGNOSIS — N401 Enlarged prostate with lower urinary tract symptoms: Secondary | ICD-10-CM | POA: Diagnosis not present

## 2020-02-11 DIAGNOSIS — M6281 Muscle weakness (generalized): Secondary | ICD-10-CM | POA: Diagnosis not present

## 2020-02-11 DIAGNOSIS — R2689 Other abnormalities of gait and mobility: Secondary | ICD-10-CM | POA: Diagnosis not present

## 2020-02-17 DIAGNOSIS — R2689 Other abnormalities of gait and mobility: Secondary | ICD-10-CM | POA: Diagnosis not present

## 2020-02-17 DIAGNOSIS — M6281 Muscle weakness (generalized): Secondary | ICD-10-CM | POA: Diagnosis not present

## 2020-02-18 DIAGNOSIS — Z940 Kidney transplant status: Secondary | ICD-10-CM | POA: Diagnosis not present

## 2020-02-18 DIAGNOSIS — Z888 Allergy status to other drugs, medicaments and biological substances status: Secondary | ICD-10-CM | POA: Diagnosis not present

## 2020-02-18 DIAGNOSIS — Z8673 Personal history of transient ischemic attack (TIA), and cerebral infarction without residual deficits: Secondary | ICD-10-CM | POA: Diagnosis not present

## 2020-02-18 DIAGNOSIS — N1832 Chronic kidney disease, stage 3b: Secondary | ICD-10-CM | POA: Diagnosis not present

## 2020-02-18 DIAGNOSIS — I4891 Unspecified atrial fibrillation: Secondary | ICD-10-CM | POA: Diagnosis not present

## 2020-02-18 DIAGNOSIS — N183 Chronic kidney disease, stage 3 unspecified: Secondary | ICD-10-CM | POA: Diagnosis not present

## 2020-02-18 DIAGNOSIS — Z794 Long term (current) use of insulin: Secondary | ICD-10-CM | POA: Diagnosis not present

## 2020-02-18 DIAGNOSIS — E8809 Other disorders of plasma-protein metabolism, not elsewhere classified: Secondary | ICD-10-CM | POA: Diagnosis not present

## 2020-02-18 DIAGNOSIS — E785 Hyperlipidemia, unspecified: Secondary | ICD-10-CM | POA: Diagnosis not present

## 2020-02-18 DIAGNOSIS — D631 Anemia in chronic kidney disease: Secondary | ICD-10-CM | POA: Diagnosis not present

## 2020-02-18 DIAGNOSIS — M109 Gout, unspecified: Secondary | ICD-10-CM | POA: Diagnosis not present

## 2020-02-18 DIAGNOSIS — E1121 Type 2 diabetes mellitus with diabetic nephropathy: Secondary | ICD-10-CM | POA: Diagnosis not present

## 2020-02-18 DIAGNOSIS — N400 Enlarged prostate without lower urinary tract symptoms: Secondary | ICD-10-CM | POA: Diagnosis not present

## 2020-02-18 DIAGNOSIS — I129 Hypertensive chronic kidney disease with stage 1 through stage 4 chronic kidney disease, or unspecified chronic kidney disease: Secondary | ICD-10-CM | POA: Diagnosis not present

## 2020-02-18 DIAGNOSIS — E0821 Diabetes mellitus due to underlying condition with diabetic nephropathy: Secondary | ICD-10-CM | POA: Diagnosis not present

## 2020-02-18 DIAGNOSIS — N2581 Secondary hyperparathyroidism of renal origin: Secondary | ICD-10-CM | POA: Diagnosis not present

## 2020-02-18 DIAGNOSIS — Z7952 Long term (current) use of systemic steroids: Secondary | ICD-10-CM | POA: Diagnosis not present

## 2020-02-18 DIAGNOSIS — Z79899 Other long term (current) drug therapy: Secondary | ICD-10-CM | POA: Diagnosis not present

## 2020-02-18 DIAGNOSIS — E1122 Type 2 diabetes mellitus with diabetic chronic kidney disease: Secondary | ICD-10-CM | POA: Diagnosis not present

## 2020-02-19 DIAGNOSIS — M6281 Muscle weakness (generalized): Secondary | ICD-10-CM | POA: Diagnosis not present

## 2020-02-19 DIAGNOSIS — R2689 Other abnormalities of gait and mobility: Secondary | ICD-10-CM | POA: Diagnosis not present

## 2020-02-24 DIAGNOSIS — M6281 Muscle weakness (generalized): Secondary | ICD-10-CM | POA: Diagnosis not present

## 2020-02-24 DIAGNOSIS — I4891 Unspecified atrial fibrillation: Secondary | ICD-10-CM | POA: Diagnosis not present

## 2020-02-24 DIAGNOSIS — R2689 Other abnormalities of gait and mobility: Secondary | ICD-10-CM | POA: Diagnosis not present

## 2020-02-24 DIAGNOSIS — Z79899 Other long term (current) drug therapy: Secondary | ICD-10-CM | POA: Diagnosis not present

## 2020-02-26 DIAGNOSIS — M6281 Muscle weakness (generalized): Secondary | ICD-10-CM | POA: Diagnosis not present

## 2020-02-26 DIAGNOSIS — R2689 Other abnormalities of gait and mobility: Secondary | ICD-10-CM | POA: Diagnosis not present

## 2020-02-29 DIAGNOSIS — I4891 Unspecified atrial fibrillation: Secondary | ICD-10-CM | POA: Diagnosis not present

## 2020-02-29 DIAGNOSIS — Z79899 Other long term (current) drug therapy: Secondary | ICD-10-CM | POA: Diagnosis not present

## 2020-02-29 DIAGNOSIS — K921 Melena: Secondary | ICD-10-CM | POA: Diagnosis not present

## 2020-02-29 DIAGNOSIS — Z6825 Body mass index (BMI) 25.0-25.9, adult: Secondary | ICD-10-CM | POA: Diagnosis not present

## 2020-03-02 ENCOUNTER — Encounter: Payer: Self-pay | Admitting: Internal Medicine

## 2020-03-02 DIAGNOSIS — R2689 Other abnormalities of gait and mobility: Secondary | ICD-10-CM | POA: Diagnosis not present

## 2020-03-02 DIAGNOSIS — M6281 Muscle weakness (generalized): Secondary | ICD-10-CM | POA: Diagnosis not present

## 2020-03-08 DIAGNOSIS — M6281 Muscle weakness (generalized): Secondary | ICD-10-CM | POA: Diagnosis not present

## 2020-03-08 DIAGNOSIS — R2689 Other abnormalities of gait and mobility: Secondary | ICD-10-CM | POA: Diagnosis not present

## 2020-03-08 DIAGNOSIS — I482 Chronic atrial fibrillation, unspecified: Secondary | ICD-10-CM | POA: Diagnosis not present

## 2020-03-08 DIAGNOSIS — D529 Folate deficiency anemia, unspecified: Secondary | ICD-10-CM | POA: Diagnosis not present

## 2020-03-08 DIAGNOSIS — D519 Vitamin B12 deficiency anemia, unspecified: Secondary | ICD-10-CM | POA: Diagnosis not present

## 2020-03-08 LAB — PROTIME-INR: INR: 1.5 — AB (ref 0.9–1.1)

## 2020-03-10 DIAGNOSIS — R2689 Other abnormalities of gait and mobility: Secondary | ICD-10-CM | POA: Diagnosis not present

## 2020-03-10 DIAGNOSIS — M6281 Muscle weakness (generalized): Secondary | ICD-10-CM | POA: Diagnosis not present

## 2020-03-13 ENCOUNTER — Encounter: Payer: Self-pay | Admitting: Gastroenterology

## 2020-03-13 NOTE — Progress Notes (Signed)
Referring Provider: Allwardt, Yetta Flock, PA Primary Care Physician:  Allwardt, Yetta Flock, PA Primary GI Physician: Dr. Gala Romney  Chief Complaint  Patient presents with  . + stool test    back in March 2021. last TCS about 5-6 years ago over at [employer].  [name] dark stools    takes iron daily    HPI:   Kevin Frey is a 79 y.o. male presenting today with a history of Allwardt, Alyssa, PA for hematochezia.  History significant for secondary hyperparathyroidism of renal origin, HLD, ESRD secondary to FSGS and diabetic nephropathy formally on PD now s/p deceased donor kidney transplant in October 2015, long-term use of immunosuppressant medications, acute ischemic vertebrobasilar artery thalamic stroke, type 2 diabetes, COPD, HTN, and atrial fibrillation on Coumadin.   Reviewed recent PCP note dated 02/29/2020.  Patient completed hematuria test on 02/24/2020 that was positive.  He reported acute dark stools that started when he started iron pills with Dr. Joseph Berkshire 3 to 4 days ago.  Admitted to feeling lightheaded and fatigued.  Plans to adjust Coumadin regimen, recheck INR in 1 week, and refer to GI urgently.  Reviewed external labs in care everywhere: FOBT + 02/24/2020. Iron panel 02/18/2020: Iron 18 (L), percent saturation 8% (L), ferritin 127, TIBC 216 (L) Hemoglobin: 02/18/2020: 9.7  11/10/2019: 11.0 08/03/2019: 11.2  Today: Presents with his wife [place].   No brbpr. Stools are dark since starting iron 2 weeks ago. States it has a green tent.  Prior to iron, stools are brown.  No abdominal pain. BMs every other day. Natural laxative by [employer] every other day for about 1 year. No constipation or diarrhea. No GERD symptoms. Denies dysphagia. Later states food will stop for a minute in his esophagus but goes down with a sip of water. Hasn't paid attention as to what foods this occurs with. Can't tell me how often or when it started. Never had to bring foods back up. No trouble with liquids. No  nausea or vomiting.   Reports INR went up to 6 two weeks ago. Last week it was 1.5, today it was 1.3. Currently on 5 mg warfarin daily. Managed by Dr. Quillian Quince. Hasn't followed with cardiology in a while.  History of stroke in 2015 of unclear etiology but it occurred 1 month after kidney transplant.   TCS 5-6 years ago at [place] he had a polyp.   Had some weakness a few weeks ago but this has improved since starting iron. Feels he is more alert and less fatigued.   Has had some weight loss. States over the last few years he was intentionally losing weight.  Report weight now stays between 160-170. Appetite is good.   Creatinine stable over the last year, most recently 1.93 in March 2021.   No NSAIDs.   Reports he had his hemoglobin checked last week and it was "ok".   Past Medical History:  Diagnosis Date  . Acute ischemic vertebrobasilar artery thalamic stroke ([place]) 2015  . Atrial fibrillation ([place])    On Coumadin  . COPD (chronic obstructive pulmonary disease) ([place])   . Deceased-donor kidney transplant 2015  . Diabetic nephropathy ([place])   . End stage renal disease ([place])   . FSGS (focal segmental glomerulosclerosis)   . HLD (hyperlipidemia)   . HTN (hypertension)   . Long-term use of immunosuppressant medication   . Type 2 diabetes mellitus ([place])     Past Surgical History:  Procedure Laterality Date  . COLONOSCOPY    .  Deceased donor kidney transplant  2015    Current Outpatient Medications  Medication Sig Dispense Refill  . alfuzosin (UROXATRAL) 10 MG 24 hr tablet Take 10 mg by mouth daily with breakfast.    . allopurinol (ZYLOPRIM) 300 MG tablet Take 150 mg by mouth daily.    [name] amLODipine (NORVASC) 5 MG tablet Take 1 tablet by mouth daily.    . Cholecalciferol 25 MCG (1000 UT) tablet Take by mouth daily.    . colchicine 0.6 MG tablet Take 0.6 mg by mouth 2 (two) times daily as needed.    . cycloSPORINE modified (GENGRAF) 25 MG capsule Take 150 mg by mouth  2 (two) times daily.    . diclofenac Sodium (VOLTAREN) 1 % GEL Apply topically as needed.    [name] ELDERBERRY PO Take by mouth daily.    [name] FERREX 150 150 MG capsule Take 1 capsule by mouth in the morning and at bedtime.    . furosemide (LASIX) 40 MG tablet Take 80 mg by mouth in the morning and at bedtime.    . insulin glargine (LANTUS) 100 UNIT/ML injection Inject 15 Units into the skin at bedtime.     . Multiple Vitamin (MULTI-VITAMIN DAILY PO) daily.    . mycophenolate (MYFORTIC) 180 MG EC tablet Take 180 mg by mouth 2 (two) times daily.    . Omega-3 1000 MG CAPS Take by mouth daily.    . traMADol-acetaminophen (ULTRACET) 37.5-325 MG tablet in the morning and at bedtime.    [name] warfarin (COUMADIN) 10 MG tablet Take 5 mg by mouth daily.     No current facility-administered medications for this visit.    Allergies as of 03/14/2020 - Review Complete 03/14/2020  Allergen Reaction Noted  . Lisinopril Other (See Comments) 03/24/2009    Family History  Problem Relation Age of Onset  . Colon cancer Neg Hx   . Stomach cancer Neg Hx     Social History   Socioeconomic History  . Marital status: Unknown    Spouse name: Not on file  . Number of children: Not on file  . Years of education: Not on file  . Highest education level: Not on file  Occupational History  . Not on file  Tobacco Use  . Smoking status: Former Smoker    Types: Cigarettes  . Smokeless tobacco: Never Used  Substance and Sexual Activity  . Alcohol use: Yes    Comment: occas glass of wine  . Drug use: Never  . Sexual activity: Not on file  Other Topics Concern  . Not on file  Social History Narrative  . Not on file   Social Determinants of Health   Financial Resource Strain:   . Difficulty of Paying Living Expenses:   Food Insecurity:   . Worried About Charity fundraiser in the Last Year:   . Arboriculturist in the Last Year:   Transportation Needs:   . Film/video editor (Medical):   [name] Lack of  Transportation (Non-Medical):   Physical Activity:   . Days of Exercise per Week:   . Minutes of Exercise per Session:   Stress:   . Feeling of Stress :   Social Connections:   . Frequency of Communication with Friends and Family:   . Frequency of Social Gatherings with Friends and Family:   . Attends Religious Services:   . Active Member of Clubs or Organizations:   . Attends Archivist Meetings:   [name] Marital Status:  Review of Systems: Gen: Denies fever, chills, cold or flulike symptoms. CV: Denies chest pain or palpitations Resp: Denies dyspnea at rest.  Admits to occasional cough.  GI: See HPI Derm: Denies rash Psych: Denies depression or anxiety Heme: See HPI  Physical Exam: BP 113/61   Pulse 60   Temp (!) 97.3 F (36.3 C) (Oral)   Ht 5\' 11"  (1.803 m)   Wt 170 lb 3.2 oz (77.2 kg)   BMI 23.74 kg/m  General:   Alert and oriented. No distress noted. Pleasant and cooperative.  Uses a cane.  Minimal assistance when getting onto exam table. Head:  Normocephalic and atraumatic. Eyes:  Conjuctiva clear without scleral icterus. Heart:  S1, S2 present without murmurs appreciated. Lungs:  Clear to auscultation bilaterally. No wheezes, rales, or rhonchi. No distress.  Abdomen:  +BS, soft, non-tender and non-distended. No rebound or guarding. No HSM or masses noted. Msk:  Symmetrical without gross deformities. Normal posture. Extremities:  With 1-2+ bilateral LE edema below the knees. Neurologic:  Alert and  oriented x4 Psych:  Normal mood and affect.

## 2020-03-14 ENCOUNTER — Telehealth: Payer: Self-pay | Admitting: Emergency Medicine

## 2020-03-14 ENCOUNTER — Ambulatory Visit (INDEPENDENT_AMBULATORY_CARE_PROVIDER_SITE_OTHER): Payer: Medicare Other | Admitting: Gastroenterology

## 2020-03-14 ENCOUNTER — Encounter: Payer: Self-pay | Admitting: Gastroenterology

## 2020-03-14 ENCOUNTER — Other Ambulatory Visit: Payer: Self-pay

## 2020-03-14 DIAGNOSIS — R195 Other fecal abnormalities: Secondary | ICD-10-CM | POA: Diagnosis not present

## 2020-03-14 DIAGNOSIS — R1310 Dysphagia, unspecified: Secondary | ICD-10-CM | POA: Diagnosis not present

## 2020-03-14 DIAGNOSIS — I4891 Unspecified atrial fibrillation: Secondary | ICD-10-CM | POA: Diagnosis not present

## 2020-03-14 DIAGNOSIS — D649 Anemia, unspecified: Secondary | ICD-10-CM | POA: Diagnosis not present

## 2020-03-14 NOTE — Telephone Encounter (Signed)
ERROR

## 2020-03-14 NOTE — Patient Instructions (Signed)
We will reach out to your primary care to determine whether or not you need bridging anticoagulation therapy prior to procedures or if it is okay for you to hold warfarin.  We will also reach out to your nephrologist to get the okay to proceed with procedures.  Continue monitoring for any bright red blood per rectum or pitch black tarry stools and let me know if this occurs.  I am also requesting your recent labs from your primary care doctor.  Continue taking iron twice daily.  We will reach back out to you once we have heard back from your primary care and nephrologist with further recommendations.  Please call our office back if you have not heard from [place] in a couple of weeks.  Aliene Altes, PA-C [facility]

## 2020-03-15 ENCOUNTER — Telehealth: Payer: Self-pay | Admitting: Internal Medicine

## 2020-03-15 ENCOUNTER — Encounter: Payer: Self-pay | Admitting: Gastroenterology

## 2020-03-15 DIAGNOSIS — R2689 Other abnormalities of gait and mobility: Secondary | ICD-10-CM | POA: Diagnosis not present

## 2020-03-15 DIAGNOSIS — R1310 Dysphagia, unspecified: Secondary | ICD-10-CM | POA: Insufficient documentation

## 2020-03-15 DIAGNOSIS — R195 Other fecal abnormalities: Secondary | ICD-10-CM | POA: Insufficient documentation

## 2020-03-15 DIAGNOSIS — M6281 Muscle weakness (generalized): Secondary | ICD-10-CM | POA: Diagnosis not present

## 2020-03-15 DIAGNOSIS — D649 Anemia, unspecified: Secondary | ICD-10-CM | POA: Insufficient documentation

## 2020-03-15 NOTE — Assessment & Plan Note (Signed)
Addressed under anemia.

## 2020-03-15 NOTE — Assessment & Plan Note (Addendum)
79 year old male reporting food stopping in his esophagus for a short time but goes down with a sip of water.  Cannot tell me what foods this occurs with, how often, or when it started.  He has never had to bring food back up.  No trouble liquids.  Denies GERD symptoms.  Overall, he is not very bothered by this.  He does have history of anemia with slight decline in hemoglobin and heme positive stools recently as discussed above for which I have recommended colonoscopy and upper endoscopy. Could evaluate dysphagia further and perform an esophageal dilation as appropriate at time of EGD.  Patient is requested I get the okay from his nephrologist prior to scheduling any procedures.   We will reach out to patient's nephrologist to get the okay to pursue procedures. We will also reach out to patient's PCP to discuss management of warfarin.  I suspect he will likely need bridging therapy. Further recommendations to follow.

## 2020-03-15 NOTE — Assessment & Plan Note (Addendum)
79 year old male with history of ESRD s/p deceased donor kidney transplant in 2015, stroke 1 month after transplant, diabetes, COPD, HTN, HLD, atrial fibrillation on Coumadin, and chronic anemia in the setting of ESRD.  He presents today due to declining hemoglobin and heme positive stool.  Most recent hemoglobin 9.7 in March 2021, down from 11.0 in December 2020.  Stool was heme positive on 02/24/2020.  Iron panel 02/18/2020 with Iron 18 (L), percent saturation 8% (L), ferritin 127, TIBC 216 (L).  Creatinine has been stable over the last year with most recent creatinine 1.93 in March 2021.  Patient reports INR up to 6 two weeks ago, down to 1.3 today. Dark stools with a green tent only since starting iron 2 weeks.  Otherwise, no BRBPR or melena.  Reports weakness and fatigue recently but feels this has improved since starting iron. Denies abdominal pain, GERD symptoms, nausea, vomiting, or NSAID use.  Weight has been stable. Admits to dysphagia as per above. Of note, patient reports recent labs with PCP last week with hemoglobin rechecked. States it was "ok". Reports last colonoscopy 5-6 years ago at [place] he had polyps.   Down trend in hemoglobin with heme positive stool could be from occult GI blood loss essentially anywhere in his GI tract in the setting of anticoagulation. Differentials include colon polyps, malignancy, AVMs, gastritis, duodenitis, PUD, H. Pylori.   Discussed pursuing TCS and EGD for further evaluation for heme positive stool and dysphagia. Patient requested I get the okay from his nephrologist prior to scheduling procedures. Additionally, we will need to discuss management of Warfarin perioperatively with his PCP, Dr. Quillian Quince, as he manages his Warfarin. Suspect patient will need bridging.   We will reach out to patient's nephrologist as well as PCP Dr. Quillian Quince.  Further recommendations to follow. Continue monitoring for any bright red blood per rectum or pitch black tarry  stools. Continue taking iron twice daily Request recent labs from PCP.  Request TCS report from [facility].   .

## 2020-03-15 NOTE — Telephone Encounter (Signed)
PCP office called replying back from a fax. She said that Dr Quillian Quince wants patient to hold warfin 4 days prior to procedure, PCP does want a bridge done SQ daily with Lovenox 80 mg, Stay on Lovenox until IR is over 2 after procedure and restart coumadin the following evening. After procedure check IR 2 days after procedure, Any questions call [place]. [phone]

## 2020-03-15 NOTE — Telephone Encounter (Signed)
Routing to [facility].

## 2020-03-15 NOTE — Telephone Encounter (Signed)
Noted. Also waiting on a response from patients nephrologist prior to scheduling procedure at patiens request.

## 2020-03-16 ENCOUNTER — Telehealth: Payer: Self-pay | Admitting: Emergency Medicine

## 2020-03-16 NOTE — Telephone Encounter (Signed)
Noted. Did we receive any documentation for this or was this a telephone call from his nephrologist?

## 2020-03-16 NOTE — Telephone Encounter (Signed)
Received medical clearance from [facility] that stated pt is cleared to have tcs/egd

## 2020-03-17 ENCOUNTER — Telehealth: Payer: Self-pay | Admitting: Gastroenterology

## 2020-03-17 DIAGNOSIS — I4891 Unspecified atrial fibrillation: Secondary | ICD-10-CM | POA: Diagnosis not present

## 2020-03-17 DIAGNOSIS — Z6824 Body mass index (BMI) 24.0-24.9, adult: Secondary | ICD-10-CM | POA: Diagnosis not present

## 2020-03-17 DIAGNOSIS — Z940 Kidney transplant status: Secondary | ICD-10-CM | POA: Diagnosis not present

## 2020-03-17 DIAGNOSIS — I482 Chronic atrial fibrillation, unspecified: Secondary | ICD-10-CM | POA: Diagnosis not present

## 2020-03-17 DIAGNOSIS — I10 Essential (primary) hypertension: Secondary | ICD-10-CM | POA: Diagnosis not present

## 2020-03-17 DIAGNOSIS — E1122 Type 2 diabetes mellitus with diabetic chronic kidney disease: Secondary | ICD-10-CM | POA: Diagnosis not present

## 2020-03-17 DIAGNOSIS — E1142 Type 2 diabetes mellitus with diabetic polyneuropathy: Secondary | ICD-10-CM | POA: Diagnosis not present

## 2020-03-17 DIAGNOSIS — M6281 Muscle weakness (generalized): Secondary | ICD-10-CM | POA: Diagnosis not present

## 2020-03-17 DIAGNOSIS — R2689 Other abnormalities of gait and mobility: Secondary | ICD-10-CM | POA: Diagnosis not present

## 2020-03-17 DIAGNOSIS — Z1212 Encounter for screening for malignant neoplasm of rectum: Secondary | ICD-10-CM | POA: Diagnosis not present

## 2020-03-17 NOTE — Telephone Encounter (Signed)
The clearance was faxed over and It was sent to the scan center.it should be under the media tab in a few days

## 2020-03-17 NOTE — Telephone Encounter (Signed)
See note from [place]

## 2020-03-17 NOTE — Telephone Encounter (Signed)
Received and reviewed labs from primary care completed 03/08/2020:  CBC: WBC 10.4, hemoglobin 9.7 (L), hematocrit 30.9 (L), MCV 86, MCH 26.9, MCHC 31.4 (L), platelets 254. Iron panel: Iron 19 (L), iron saturation 10% (L), ferritin 214, TIBC 188 (L) Vitamin H57 4734 (H) Folic acid >03  Received and reviewed last colonoscopy report from [facility] dated 04/15/2013 Findings: 3 polyps were found.  2 in the transverse colon and one in the descending colon all removed and sent for pathology. Pathology revealed tubular adenomas.  Alicia: Please let patient know I have received and reviewed his labs from his primary care as well as his last colonoscopy report.  Hemoglobin was stable on 03/08/2020 at 9.7.  Last hemoglobin prior to this was 02/18/2020 also at 9.7.  Total iron and iron saturation are still on the low side.  Last colonoscopy was in 2014 with 3 tubular adenomas.  He is overdue for colonoscopy at this time.  We discussed scheduling procedures.  See telephone note dated 03/17/2020.  Patient states he is having a second stool test completed with PCP and prefers to wait for that result prior to scheduling any procedures.  He was advised to have PCP fax stool testing results to our office and call our office when he is ready to schedule his procedures.

## 2020-03-17 NOTE — Telephone Encounter (Signed)
Noted. Reviewed clearance from Dr. Joseph Berkshire who states patient is medically cleared for TCS/EGD. Spoke with patient today. He and his wife state his PCP has ordered another blood test to see if the blood is still detected before he goes through with scheduling. Patient stated he would like to wait for the stool test results before deciding whether or not he wants to have any procedures.   He was advised to have PCP fax stool test results to our office and to call our office back if he would like to schedule TCS/EGD.

## 2020-03-21 DIAGNOSIS — Z23 Encounter for immunization: Secondary | ICD-10-CM | POA: Diagnosis not present

## 2020-03-22 DIAGNOSIS — R2689 Other abnormalities of gait and mobility: Secondary | ICD-10-CM | POA: Diagnosis not present

## 2020-03-22 DIAGNOSIS — M6281 Muscle weakness (generalized): Secondary | ICD-10-CM | POA: Diagnosis not present

## 2020-03-23 DIAGNOSIS — I4891 Unspecified atrial fibrillation: Secondary | ICD-10-CM | POA: Diagnosis not present

## 2020-03-24 DIAGNOSIS — M6281 Muscle weakness (generalized): Secondary | ICD-10-CM | POA: Diagnosis not present

## 2020-03-24 DIAGNOSIS — R2689 Other abnormalities of gait and mobility: Secondary | ICD-10-CM | POA: Diagnosis not present

## 2020-03-29 DIAGNOSIS — M6281 Muscle weakness (generalized): Secondary | ICD-10-CM | POA: Diagnosis not present

## 2020-03-29 DIAGNOSIS — R2689 Other abnormalities of gait and mobility: Secondary | ICD-10-CM | POA: Diagnosis not present

## 2020-03-29 DIAGNOSIS — I4891 Unspecified atrial fibrillation: Secondary | ICD-10-CM | POA: Diagnosis not present

## 2020-03-31 DIAGNOSIS — R2689 Other abnormalities of gait and mobility: Secondary | ICD-10-CM | POA: Diagnosis not present

## 2020-03-31 DIAGNOSIS — M6281 Muscle weakness (generalized): Secondary | ICD-10-CM | POA: Diagnosis not present

## 2020-04-01 DIAGNOSIS — E7849 Other hyperlipidemia: Secondary | ICD-10-CM | POA: Diagnosis not present

## 2020-04-01 DIAGNOSIS — I4891 Unspecified atrial fibrillation: Secondary | ICD-10-CM | POA: Diagnosis not present

## 2020-04-07 DIAGNOSIS — M6281 Muscle weakness (generalized): Secondary | ICD-10-CM | POA: Diagnosis not present

## 2020-04-07 DIAGNOSIS — R2689 Other abnormalities of gait and mobility: Secondary | ICD-10-CM | POA: Diagnosis not present

## 2020-04-12 DIAGNOSIS — R2689 Other abnormalities of gait and mobility: Secondary | ICD-10-CM | POA: Diagnosis not present

## 2020-04-12 DIAGNOSIS — I4891 Unspecified atrial fibrillation: Secondary | ICD-10-CM | POA: Diagnosis not present

## 2020-04-12 DIAGNOSIS — M6281 Muscle weakness (generalized): Secondary | ICD-10-CM | POA: Diagnosis not present

## 2020-04-14 DIAGNOSIS — R2689 Other abnormalities of gait and mobility: Secondary | ICD-10-CM | POA: Diagnosis not present

## 2020-04-14 DIAGNOSIS — M6281 Muscle weakness (generalized): Secondary | ICD-10-CM | POA: Diagnosis not present

## 2020-04-15 DIAGNOSIS — J449 Chronic obstructive pulmonary disease, unspecified: Secondary | ICD-10-CM | POA: Diagnosis not present

## 2020-04-15 DIAGNOSIS — I482 Chronic atrial fibrillation, unspecified: Secondary | ICD-10-CM | POA: Diagnosis not present

## 2020-04-15 DIAGNOSIS — D529 Folate deficiency anemia, unspecified: Secondary | ICD-10-CM | POA: Diagnosis not present

## 2020-04-15 DIAGNOSIS — I10 Essential (primary) hypertension: Secondary | ICD-10-CM | POA: Diagnosis not present

## 2020-04-15 DIAGNOSIS — D649 Anemia, unspecified: Secondary | ICD-10-CM | POA: Diagnosis not present

## 2020-04-15 DIAGNOSIS — N183 Chronic kidney disease, stage 3 unspecified: Secondary | ICD-10-CM | POA: Diagnosis not present

## 2020-04-15 DIAGNOSIS — D519 Vitamin B12 deficiency anemia, unspecified: Secondary | ICD-10-CM | POA: Diagnosis not present

## 2020-04-18 DIAGNOSIS — Z23 Encounter for immunization: Secondary | ICD-10-CM | POA: Diagnosis not present

## 2020-04-25 DIAGNOSIS — E1140 Type 2 diabetes mellitus with diabetic neuropathy, unspecified: Secondary | ICD-10-CM | POA: Diagnosis not present

## 2020-04-25 DIAGNOSIS — E1151 Type 2 diabetes mellitus with diabetic peripheral angiopathy without gangrene: Secondary | ICD-10-CM | POA: Diagnosis not present

## 2020-04-25 DIAGNOSIS — B351 Tinea unguium: Secondary | ICD-10-CM | POA: Diagnosis not present

## 2020-04-27 DIAGNOSIS — J309 Allergic rhinitis, unspecified: Secondary | ICD-10-CM | POA: Diagnosis not present

## 2020-04-27 DIAGNOSIS — J339 Nasal polyp, unspecified: Secondary | ICD-10-CM | POA: Diagnosis not present

## 2020-04-27 DIAGNOSIS — J320 Chronic maxillary sinusitis: Secondary | ICD-10-CM | POA: Diagnosis not present

## 2020-04-28 DIAGNOSIS — I4891 Unspecified atrial fibrillation: Secondary | ICD-10-CM | POA: Diagnosis not present

## 2020-05-02 DIAGNOSIS — I129 Hypertensive chronic kidney disease with stage 1 through stage 4 chronic kidney disease, or unspecified chronic kidney disease: Secondary | ICD-10-CM | POA: Diagnosis not present

## 2020-05-02 DIAGNOSIS — I482 Chronic atrial fibrillation, unspecified: Secondary | ICD-10-CM | POA: Diagnosis not present

## 2020-05-02 DIAGNOSIS — N183 Chronic kidney disease, stage 3 unspecified: Secondary | ICD-10-CM | POA: Diagnosis not present

## 2020-05-02 DIAGNOSIS — E1122 Type 2 diabetes mellitus with diabetic chronic kidney disease: Secondary | ICD-10-CM | POA: Diagnosis not present

## 2020-05-18 ENCOUNTER — Encounter: Payer: Self-pay | Admitting: *Deleted

## 2020-05-18 ENCOUNTER — Ambulatory Visit (INDEPENDENT_AMBULATORY_CARE_PROVIDER_SITE_OTHER): Payer: Medicare Other | Admitting: Dermatology

## 2020-05-18 ENCOUNTER — Other Ambulatory Visit: Payer: Self-pay

## 2020-05-18 DIAGNOSIS — D225 Melanocytic nevi of trunk: Secondary | ICD-10-CM | POA: Diagnosis not present

## 2020-05-18 DIAGNOSIS — Z1283 Encounter for screening for malignant neoplasm of skin: Secondary | ICD-10-CM

## 2020-05-18 DIAGNOSIS — L570 Actinic keratosis: Secondary | ICD-10-CM | POA: Diagnosis not present

## 2020-05-18 DIAGNOSIS — D485 Neoplasm of uncertain behavior of skin: Secondary | ICD-10-CM | POA: Diagnosis not present

## 2020-05-18 DIAGNOSIS — D229 Melanocytic nevi, unspecified: Secondary | ICD-10-CM

## 2020-05-18 NOTE — Patient Instructions (Addendum)
Biopsy, Surgery (Curettage) & Surgery (Excision) Aftercare Instructions  1. Okay to remove bandage in 24 hours  2. Wash area with soap and water  3. Apply Vaseline to area twice daily until healed (Not Neosporin)  4. Okay to cover with a Band-Aid to decrease the chance of infection or prevent irritation from clothing; also it's okay to uncover lesion at home.  5. Suture instructions: return to our office in 7-10 or 10-14 days for a nurse visit for suture removal. Variable healing with sutures, if pain or itching occurs call our office. It's okay to shower or bathe 24 hours after sutures are given.  6. The following risks may occur after a biopsy, curettage or excision: bleeding, scarring, discoloration, recurrence, infection (redness, yellow drainage, pain or swelling).  7. For questions, concerns and results call our office at [place] before 4pm & Friday before 3pm. Biopsy results will be available in 1 week. Routine follow-up for Kevin Frey date of birth 1941/03/11.  He continues to develop new spots particularly on the scalp.  The largest lesion on the right central crown is probably another low risk nonmelanoma cancer.  After shave biopsy the base was cauterized and curetted.  He can expect to have an abrasion here for 2 to 3 weeks.  Soap and water and either triple antibiotic or Vaseline would be fine. the family can call the office on Monday to discuss the results, but even if it is a superficial carcinoma this should offer at least a 90% cure. smaller crusts on the scalp and 2 on the left sideburn fit precancer and were treated with liquid nitrogen freeze.  Spots on the left cheek and the right temple are tan textured benign seborrheic keratoses and require no intervention.  I also checked his back where there are no atypical moles.  A soft tag-like mole on the right upper back may be a solitary neurofibroma and requires no treatment.

## 2020-05-23 ENCOUNTER — Telehealth: Payer: Self-pay

## 2020-05-23 NOTE — Telephone Encounter (Signed)
Phone call to patient with his pathology results.  Result given to patient's wife.

## 2020-05-23 NOTE — Telephone Encounter (Signed)
-----   Message from Lavonna Monarch, MD sent at 05/20/2020 10:24 PM EDT ----- No definite cancer but Dr T wants to recheck 1-2 months to decide if need re-biopsy.

## 2020-05-30 DIAGNOSIS — R2689 Other abnormalities of gait and mobility: Secondary | ICD-10-CM | POA: Diagnosis not present

## 2020-05-30 DIAGNOSIS — M6281 Muscle weakness (generalized): Secondary | ICD-10-CM | POA: Diagnosis not present

## 2020-06-02 DIAGNOSIS — Z79899 Other long term (current) drug therapy: Secondary | ICD-10-CM | POA: Diagnosis not present

## 2020-06-02 DIAGNOSIS — Z940 Kidney transplant status: Secondary | ICD-10-CM | POA: Diagnosis not present

## 2020-06-02 DIAGNOSIS — G4733 Obstructive sleep apnea (adult) (pediatric): Secondary | ICD-10-CM | POA: Diagnosis not present

## 2020-06-02 DIAGNOSIS — E1122 Type 2 diabetes mellitus with diabetic chronic kidney disease: Secondary | ICD-10-CM | POA: Diagnosis not present

## 2020-06-02 DIAGNOSIS — M109 Gout, unspecified: Secondary | ICD-10-CM | POA: Diagnosis not present

## 2020-06-02 DIAGNOSIS — I69321 Dysphasia following cerebral infarction: Secondary | ICD-10-CM | POA: Diagnosis not present

## 2020-06-02 DIAGNOSIS — I4891 Unspecified atrial fibrillation: Secondary | ICD-10-CM | POA: Diagnosis not present

## 2020-06-02 DIAGNOSIS — I482 Chronic atrial fibrillation, unspecified: Secondary | ICD-10-CM | POA: Diagnosis not present

## 2020-06-02 DIAGNOSIS — G6289 Other specified polyneuropathies: Secondary | ICD-10-CM | POA: Diagnosis not present

## 2020-06-02 DIAGNOSIS — E1142 Type 2 diabetes mellitus with diabetic polyneuropathy: Secondary | ICD-10-CM | POA: Diagnosis not present

## 2020-06-02 DIAGNOSIS — E782 Mixed hyperlipidemia: Secondary | ICD-10-CM | POA: Diagnosis not present

## 2020-06-02 DIAGNOSIS — I10 Essential (primary) hypertension: Secondary | ICD-10-CM | POA: Diagnosis not present

## 2020-06-03 DIAGNOSIS — E1122 Type 2 diabetes mellitus with diabetic chronic kidney disease: Secondary | ICD-10-CM | POA: Diagnosis not present

## 2020-06-03 DIAGNOSIS — I10 Essential (primary) hypertension: Secondary | ICD-10-CM | POA: Diagnosis not present

## 2020-06-03 DIAGNOSIS — E782 Mixed hyperlipidemia: Secondary | ICD-10-CM | POA: Diagnosis not present

## 2020-06-03 DIAGNOSIS — N183 Chronic kidney disease, stage 3 unspecified: Secondary | ICD-10-CM | POA: Diagnosis not present

## 2020-06-14 DIAGNOSIS — R4582 Worries: Secondary | ICD-10-CM | POA: Diagnosis not present

## 2020-06-14 DIAGNOSIS — E1122 Type 2 diabetes mellitus with diabetic chronic kidney disease: Secondary | ICD-10-CM | POA: Diagnosis not present

## 2020-06-14 DIAGNOSIS — E1142 Type 2 diabetes mellitus with diabetic polyneuropathy: Secondary | ICD-10-CM | POA: Diagnosis not present

## 2020-06-14 DIAGNOSIS — I4891 Unspecified atrial fibrillation: Secondary | ICD-10-CM | POA: Diagnosis not present

## 2020-06-14 DIAGNOSIS — E782 Mixed hyperlipidemia: Secondary | ICD-10-CM | POA: Diagnosis not present

## 2020-06-14 DIAGNOSIS — I10 Essential (primary) hypertension: Secondary | ICD-10-CM | POA: Diagnosis not present

## 2020-06-14 DIAGNOSIS — E041 Nontoxic single thyroid nodule: Secondary | ICD-10-CM | POA: Diagnosis not present

## 2020-06-14 DIAGNOSIS — Z940 Kidney transplant status: Secondary | ICD-10-CM | POA: Diagnosis not present

## 2020-06-16 DIAGNOSIS — G4733 Obstructive sleep apnea (adult) (pediatric): Secondary | ICD-10-CM | POA: Diagnosis not present

## 2020-06-16 DIAGNOSIS — Z0001 Encounter for general adult medical examination with abnormal findings: Secondary | ICD-10-CM | POA: Diagnosis not present

## 2020-06-16 DIAGNOSIS — J449 Chronic obstructive pulmonary disease, unspecified: Secondary | ICD-10-CM | POA: Diagnosis not present

## 2020-06-16 DIAGNOSIS — I482 Chronic atrial fibrillation, unspecified: Secondary | ICD-10-CM | POA: Diagnosis not present

## 2020-06-16 DIAGNOSIS — E782 Mixed hyperlipidemia: Secondary | ICD-10-CM | POA: Diagnosis not present

## 2020-06-16 DIAGNOSIS — E1142 Type 2 diabetes mellitus with diabetic polyneuropathy: Secondary | ICD-10-CM | POA: Diagnosis not present

## 2020-06-16 DIAGNOSIS — I10 Essential (primary) hypertension: Secondary | ICD-10-CM | POA: Diagnosis not present

## 2020-06-16 DIAGNOSIS — N183 Chronic kidney disease, stage 3 unspecified: Secondary | ICD-10-CM | POA: Diagnosis not present

## 2020-06-19 ENCOUNTER — Encounter: Payer: Self-pay | Admitting: Dermatology

## 2020-06-19 NOTE — Progress Notes (Signed)
Follow-Up Visit   Subjective  Kevin Frey is a 79 y.o. male who presents for the following: Follow-up (Here to check spot on top of scalp. Previous multiple SCC's Removed a couple years ago. Also check patients side burns there are some tender areas. ).  New growth Location: Scalp Duration:  Quality:  Associated Signs/Symptoms: Modifying Factors:  Severity:  Timing: Context: Chronic immunosuppression (renal transplant) and history of squamous cell carcinomas of skin  Objective  Well appearing patient in no apparent distress; mood and affect are within normal limits.  A full examination was performed including scalp, head, eyes, ears, nose, lips, neck, chest, axillae, abdomen, back, buttocks, bilateral upper extremities, bilateral lower extremities, hands, feet, fingers, toes, fingernails, and toenails. All findings within normal limits unless otherwise noted below.   Assessment & Plan   Patient Instructions  Biopsy, Surgery (Curettage) & Surgery (Excision) Aftercare Instructions  1. Okay to remove bandage in 24 hours  2. Wash area with soap and water  3. Apply Vaseline to area twice daily until healed (Not Neosporin)  4. Okay to cover with a Band-Aid to decrease the chance of infection or prevent irritation from clothing; also it's okay to uncover lesion at home.  5. Suture instructions: return to our office in 7-10 or 10-14 days for a nurse visit for suture removal. Variable healing with sutures, if pain or itching occurs call our office. It's okay to shower or bathe 24 hours after sutures are given.  6. The following risks may occur after a biopsy, curettage or excision: bleeding, scarring, discoloration, recurrence, infection (redness, yellow drainage, pain or swelling).  7. For questions, concerns and results call our office at [place] before 4pm & Friday before 3pm. Biopsy results will be available in 1 week. Routine follow-up for Kevin Frey date  of birth 06/08/1941.  He continues to develop new spots particularly on the scalp.  The largest lesion on the right central crown is probably another low risk nonmelanoma cancer.  After shave biopsy the base was cauterized and curetted.  He can expect to have an abrasion here for 2 to 3 weeks.  Soap and water and either triple antibiotic or Vaseline would be fine. the family can call the office on Monday to discuss the results, but even if it is a superficial carcinoma this should offer at least a 90% cure. smaller crusts on the scalp and 2 on the left sideburn fit precancer and were treated with liquid nitrogen freeze.  Spots on the left cheek and the right temple are tan textured benign seborrheic keratoses and require no intervention.  I also checked his back where there are no atypical moles.  A soft tag-like mole on the right upper back may be a solitary neurofibroma and requires no treatment.    Neoplasm of uncertain behavior of skin Right Occipital Scalp  Skin / nail biopsy Type of biopsy: tangential   Informed consent: discussed and consent obtained   Timeout: patient name, date of birth, surgical site, and procedure verified   Procedure prep:  Patient was prepped and draped in usual sterile fashion Prep type:  Chlorhexidine Anesthesia: the lesion was anesthetized in a standard fashion   Anesthetic:  1% lidocaine w/ epinephrine 1-100,000 local infiltration Instrument used: flexible razor blade   Hemostasis achieved with: ferric subsulfate   Outcome: patient tolerated procedure well   Post-procedure details: wound care instructions given    Specimen 1 - Surgical pathology Differential Diagnosis: scc/bcc Check Margins: No  Curet and cautery after biopsy Size 1.5cm  Shave biopsy followed by curette and cautery of base  AK (actinic keratosis) (6) Left Occipital Scalp; Mid Parietal Scalp (2); Mid Occipital Scalp; Left Zygomatic Area; Left Temporal Scalp  Destruction of lesion -  Left Occipital Scalp, Left Temporal Scalp, Left Zygomatic Area, Mid Occipital Scalp, Mid Parietal Scalp (2) Complexity: simple   Destruction method: cryotherapy   Informed consent: discussed and consent obtained   Timeout:  patient name, date of birth, surgical site, and procedure verified Lesion destroyed using liquid nitrogen: Yes   Region frozen until ice ball extended beyond lesion: Yes   Cryotherapy cycles:  5 Outcome: patient tolerated procedure well with no complications    Nevus Mid Back  In view of chronic immunosuppression plus history of skin cancer, I will check Kevin Frey every 6 months.  Skin cancer screening performed today.     I, Lavonna Monarch, MD, have reviewed all documentation for this visit.  The documentation on 06/19/20 for the exam, diagnosis, procedures, and orders are all accurate and complete.

## 2020-06-21 DIAGNOSIS — I482 Chronic atrial fibrillation, unspecified: Secondary | ICD-10-CM | POA: Diagnosis not present

## 2020-07-05 ENCOUNTER — Ambulatory Visit (INDEPENDENT_AMBULATORY_CARE_PROVIDER_SITE_OTHER): Payer: Medicare Other | Admitting: Dermatology

## 2020-07-05 ENCOUNTER — Other Ambulatory Visit: Payer: Self-pay

## 2020-07-05 ENCOUNTER — Encounter: Payer: Self-pay | Admitting: Dermatology

## 2020-07-05 DIAGNOSIS — L0100 Impetigo, unspecified: Secondary | ICD-10-CM | POA: Diagnosis not present

## 2020-07-05 DIAGNOSIS — L570 Actinic keratosis: Secondary | ICD-10-CM

## 2020-07-05 NOTE — Patient Instructions (Signed)
Recheck of acutely inflamed impetiginized spot on the right crown of the scalp.  The infectious process is essentially clear leaving only a pink horny crust that fits a thick solar keratosis this was treated with 5-second liquid nitrogen freeze and the family will call me in 4 weeks to tell me whether it smooth.  If it keeps recurring we will consider taking a biopsy in the future.

## 2020-07-05 NOTE — Progress Notes (Signed)
LN2  Scalp X1 IMPETIGINIZED HYPERKERATOTIC SCALE CRUST,

## 2020-07-11 DIAGNOSIS — B351 Tinea unguium: Secondary | ICD-10-CM | POA: Diagnosis not present

## 2020-07-11 DIAGNOSIS — E1151 Type 2 diabetes mellitus with diabetic peripheral angiopathy without gangrene: Secondary | ICD-10-CM | POA: Diagnosis not present

## 2020-07-11 DIAGNOSIS — E1140 Type 2 diabetes mellitus with diabetic neuropathy, unspecified: Secondary | ICD-10-CM | POA: Diagnosis not present

## 2020-07-21 DIAGNOSIS — N2581 Secondary hyperparathyroidism of renal origin: Secondary | ICD-10-CM | POA: Diagnosis not present

## 2020-07-21 DIAGNOSIS — D631 Anemia in chronic kidney disease: Secondary | ICD-10-CM | POA: Diagnosis not present

## 2020-07-21 DIAGNOSIS — E8809 Other disorders of plasma-protein metabolism, not elsewhere classified: Secondary | ICD-10-CM | POA: Diagnosis not present

## 2020-07-21 DIAGNOSIS — Z79899 Other long term (current) drug therapy: Secondary | ICD-10-CM | POA: Diagnosis not present

## 2020-07-21 DIAGNOSIS — E1122 Type 2 diabetes mellitus with diabetic chronic kidney disease: Secondary | ICD-10-CM | POA: Diagnosis not present

## 2020-07-21 DIAGNOSIS — N183 Chronic kidney disease, stage 3 unspecified: Secondary | ICD-10-CM | POA: Diagnosis not present

## 2020-07-21 DIAGNOSIS — E785 Hyperlipidemia, unspecified: Secondary | ICD-10-CM | POA: Diagnosis not present

## 2020-07-21 DIAGNOSIS — I252 Old myocardial infarction: Secondary | ICD-10-CM | POA: Diagnosis not present

## 2020-07-21 DIAGNOSIS — I129 Hypertensive chronic kidney disease with stage 1 through stage 4 chronic kidney disease, or unspecified chronic kidney disease: Secondary | ICD-10-CM | POA: Diagnosis not present

## 2020-07-21 DIAGNOSIS — M109 Gout, unspecified: Secondary | ICD-10-CM | POA: Diagnosis not present

## 2020-07-21 DIAGNOSIS — R809 Proteinuria, unspecified: Secondary | ICD-10-CM | POA: Diagnosis not present

## 2020-07-21 DIAGNOSIS — N1832 Chronic kidney disease, stage 3b: Secondary | ICD-10-CM | POA: Diagnosis not present

## 2020-07-21 DIAGNOSIS — I4891 Unspecified atrial fibrillation: Secondary | ICD-10-CM | POA: Diagnosis not present

## 2020-07-21 DIAGNOSIS — Z940 Kidney transplant status: Secondary | ICD-10-CM | POA: Diagnosis not present

## 2020-07-21 DIAGNOSIS — D291 Benign neoplasm of prostate: Secondary | ICD-10-CM | POA: Diagnosis not present

## 2020-07-22 DIAGNOSIS — Z79899 Other long term (current) drug therapy: Secondary | ICD-10-CM | POA: Diagnosis not present

## 2020-07-22 DIAGNOSIS — I4891 Unspecified atrial fibrillation: Secondary | ICD-10-CM | POA: Diagnosis not present

## 2020-08-02 DIAGNOSIS — N183 Chronic kidney disease, stage 3 unspecified: Secondary | ICD-10-CM | POA: Diagnosis not present

## 2020-08-02 DIAGNOSIS — I129 Hypertensive chronic kidney disease with stage 1 through stage 4 chronic kidney disease, or unspecified chronic kidney disease: Secondary | ICD-10-CM | POA: Diagnosis not present

## 2020-08-02 DIAGNOSIS — I482 Chronic atrial fibrillation, unspecified: Secondary | ICD-10-CM | POA: Diagnosis not present

## 2020-08-02 DIAGNOSIS — E1122 Type 2 diabetes mellitus with diabetic chronic kidney disease: Secondary | ICD-10-CM | POA: Diagnosis not present

## 2020-08-16 ENCOUNTER — Encounter: Payer: Self-pay | Admitting: Dermatology

## 2020-08-16 NOTE — Progress Notes (Signed)
   Follow-Up Visit   Subjective  Kevin Frey is a 79 y.o. male who presents for the following: Follow-up (right occipital scalp IMPETIGINIZED HYPERKERATOTIC SCALE CRUST, SEE DESCRIPTION).  Crust Location: Right crown scalp duration:  Quality: Several months; much improved Associated Signs/Symptoms: Modifying Factors:  Severity:  Timing: Context: Renal transplant, chronic immunosuppression  Objective  Well appearing patient in no apparent distress; mood and affect are within normal limits.  A focused examination was performed including Head and neck.. Relevant physical exam findings are noted in the Assessment and Plan.   Assessment & Plan   Recheck of acutely inflamed impetiginized spot on the right crown of the scalp.  The infectious process is essentially clear leaving only a pink horny crust that fits a thick solar keratosis this was treated with 5-second liquid nitrogen freeze and the family will call me in 4 weeks to tell me whether it smooth.  If it keeps recurring we will consider taking a biopsy in the future.    Impetigo Mid Parietal Scalp  Need not use any more antibiotic.  If clears with treating the actinic keratosis then follow-up can be on a as needed basis.  AK (actinic keratosis) Mid Parietal Scalp  Family to call with status report in 3 weeks; if there is still residual, he will return for biopsy.  Destruction of lesion - Mid Parietal Scalp Complexity: simple   Destruction method: cryotherapy   Informed consent: discussed and consent obtained   Timeout:  patient name, date of birth, surgical site, and procedure verified Lesion destroyed using liquid nitrogen: Yes   Region frozen until ice ball extended beyond lesion: Yes   Cryotherapy cycles:  5 Outcome: patient tolerated procedure well with no complications       I, Lavonna Monarch, MD, have reviewed all documentation for this visit.  The documentation on 08/16/20 for the exam, diagnosis, procedures,  and orders are all accurate and complete.

## 2020-08-19 DIAGNOSIS — I4891 Unspecified atrial fibrillation: Secondary | ICD-10-CM | POA: Diagnosis not present

## 2020-08-19 DIAGNOSIS — Z79899 Other long term (current) drug therapy: Secondary | ICD-10-CM | POA: Diagnosis not present

## 2020-09-01 DIAGNOSIS — N183 Chronic kidney disease, stage 3 unspecified: Secondary | ICD-10-CM | POA: Diagnosis not present

## 2020-09-01 DIAGNOSIS — E1122 Type 2 diabetes mellitus with diabetic chronic kidney disease: Secondary | ICD-10-CM | POA: Diagnosis not present

## 2020-09-01 DIAGNOSIS — I129 Hypertensive chronic kidney disease with stage 1 through stage 4 chronic kidney disease, or unspecified chronic kidney disease: Secondary | ICD-10-CM | POA: Diagnosis not present

## 2020-09-01 DIAGNOSIS — I482 Chronic atrial fibrillation, unspecified: Secondary | ICD-10-CM | POA: Diagnosis not present

## 2020-09-12 DIAGNOSIS — N183 Chronic kidney disease, stage 3 unspecified: Secondary | ICD-10-CM | POA: Diagnosis not present

## 2020-09-12 DIAGNOSIS — D529 Folate deficiency anemia, unspecified: Secondary | ICD-10-CM | POA: Diagnosis not present

## 2020-09-12 DIAGNOSIS — I482 Chronic atrial fibrillation, unspecified: Secondary | ICD-10-CM | POA: Diagnosis not present

## 2020-09-12 DIAGNOSIS — D519 Vitamin B12 deficiency anemia, unspecified: Secondary | ICD-10-CM | POA: Diagnosis not present

## 2020-09-12 DIAGNOSIS — E782 Mixed hyperlipidemia: Secondary | ICD-10-CM | POA: Diagnosis not present

## 2020-09-12 DIAGNOSIS — E1142 Type 2 diabetes mellitus with diabetic polyneuropathy: Secondary | ICD-10-CM | POA: Diagnosis not present

## 2020-09-12 DIAGNOSIS — E1122 Type 2 diabetes mellitus with diabetic chronic kidney disease: Secondary | ICD-10-CM | POA: Diagnosis not present

## 2020-09-12 DIAGNOSIS — J449 Chronic obstructive pulmonary disease, unspecified: Secondary | ICD-10-CM | POA: Diagnosis not present

## 2020-09-12 DIAGNOSIS — E1165 Type 2 diabetes mellitus with hyperglycemia: Secondary | ICD-10-CM | POA: Diagnosis not present

## 2020-09-12 DIAGNOSIS — D649 Anemia, unspecified: Secondary | ICD-10-CM | POA: Diagnosis not present

## 2020-09-14 DIAGNOSIS — Z23 Encounter for immunization: Secondary | ICD-10-CM | POA: Diagnosis not present

## 2020-09-14 DIAGNOSIS — I69321 Dysphasia following cerebral infarction: Secondary | ICD-10-CM | POA: Diagnosis not present

## 2020-09-14 DIAGNOSIS — E041 Nontoxic single thyroid nodule: Secondary | ICD-10-CM | POA: Diagnosis not present

## 2020-09-14 DIAGNOSIS — I10 Essential (primary) hypertension: Secondary | ICD-10-CM | POA: Diagnosis not present

## 2020-09-14 DIAGNOSIS — R4582 Worries: Secondary | ICD-10-CM | POA: Diagnosis not present

## 2020-09-14 DIAGNOSIS — E1122 Type 2 diabetes mellitus with diabetic chronic kidney disease: Secondary | ICD-10-CM | POA: Diagnosis not present

## 2020-09-14 DIAGNOSIS — Z79899 Other long term (current) drug therapy: Secondary | ICD-10-CM | POA: Diagnosis not present

## 2020-09-26 ENCOUNTER — Ambulatory Visit (INDEPENDENT_AMBULATORY_CARE_PROVIDER_SITE_OTHER): Payer: Medicare Other | Admitting: Dermatology

## 2020-09-26 ENCOUNTER — Other Ambulatory Visit: Payer: Self-pay

## 2020-09-26 DIAGNOSIS — Z1283 Encounter for screening for malignant neoplasm of skin: Secondary | ICD-10-CM | POA: Diagnosis not present

## 2020-09-26 DIAGNOSIS — Z85828 Personal history of other malignant neoplasm of skin: Secondary | ICD-10-CM

## 2020-09-26 DIAGNOSIS — L570 Actinic keratosis: Secondary | ICD-10-CM | POA: Diagnosis not present

## 2020-09-26 DIAGNOSIS — Z86007 Personal history of in-situ neoplasm of skin: Secondary | ICD-10-CM

## 2020-09-26 DIAGNOSIS — Z8589 Personal history of malignant neoplasm of other organs and systems: Secondary | ICD-10-CM

## 2020-09-28 ENCOUNTER — Encounter: Payer: Self-pay | Admitting: Dermatology

## 2020-10-01 DIAGNOSIS — I129 Hypertensive chronic kidney disease with stage 1 through stage 4 chronic kidney disease, or unspecified chronic kidney disease: Secondary | ICD-10-CM | POA: Diagnosis not present

## 2020-10-01 DIAGNOSIS — E1122 Type 2 diabetes mellitus with diabetic chronic kidney disease: Secondary | ICD-10-CM | POA: Diagnosis not present

## 2020-10-01 DIAGNOSIS — I482 Chronic atrial fibrillation, unspecified: Secondary | ICD-10-CM | POA: Diagnosis not present

## 2020-10-01 DIAGNOSIS — N183 Chronic kidney disease, stage 3 unspecified: Secondary | ICD-10-CM | POA: Diagnosis not present

## 2020-10-03 DIAGNOSIS — E1151 Type 2 diabetes mellitus with diabetic peripheral angiopathy without gangrene: Secondary | ICD-10-CM | POA: Diagnosis not present

## 2020-10-03 DIAGNOSIS — B351 Tinea unguium: Secondary | ICD-10-CM | POA: Diagnosis not present

## 2020-10-03 DIAGNOSIS — E1140 Type 2 diabetes mellitus with diabetic neuropathy, unspecified: Secondary | ICD-10-CM | POA: Diagnosis not present

## 2020-10-11 DIAGNOSIS — R059 Cough, unspecified: Secondary | ICD-10-CM | POA: Diagnosis not present

## 2020-10-11 DIAGNOSIS — J329 Chronic sinusitis, unspecified: Secondary | ICD-10-CM | POA: Diagnosis not present

## 2020-10-11 DIAGNOSIS — Z20828 Contact with and (suspected) exposure to other viral communicable diseases: Secondary | ICD-10-CM | POA: Diagnosis not present

## 2020-10-19 DIAGNOSIS — I4891 Unspecified atrial fibrillation: Secondary | ICD-10-CM | POA: Diagnosis not present

## 2020-10-24 DIAGNOSIS — J309 Allergic rhinitis, unspecified: Secondary | ICD-10-CM | POA: Diagnosis not present

## 2020-10-24 DIAGNOSIS — J339 Nasal polyp, unspecified: Secondary | ICD-10-CM | POA: Diagnosis not present

## 2020-10-24 DIAGNOSIS — J320 Chronic maxillary sinusitis: Secondary | ICD-10-CM | POA: Diagnosis not present

## 2020-10-24 DIAGNOSIS — R058 Other specified cough: Secondary | ICD-10-CM | POA: Diagnosis not present

## 2020-10-24 DIAGNOSIS — R040 Epistaxis: Secondary | ICD-10-CM | POA: Diagnosis not present

## 2020-11-06 NOTE — Progress Notes (Signed)
   Follow-Up Visit   Subjective  Kevin Frey is a 79 y.o. male who presents for the following: Follow-up (Patient here today for follow up on scalp. Per patient healed up well. Check place on right ear per patients wife.).  Multiple skin issues addressed Location:  Duration:  Quality:  Associated Signs/Symptoms: Modifying Factors:  Severity:  Timing: Context:   Objective  Well appearing patient in no apparent distress; mood and affect are within normal limits.  All skin waist up examined.   Assessment & Plan    AK (actinic keratosis) (7) Right Mid Helix; Left Preauricular Area; Right Parotid Area (3); Right Temporal Scalp (2)  Treated today will follow up in 1 year for skin check.  Destruction of lesion - Left Preauricular Area, Right Mid Helix, Right Parotid Area, Right Temporal Scalp Complexity: simple   Destruction method: cryotherapy   Informed consent: discussed and consent obtained   Timeout:  patient name, date of birth, surgical site, and procedure verified Lesion destroyed using liquid nitrogen: Yes   Cryotherapy cycles:  5 Outcome: patient tolerated procedure well with no complications   Post-procedure details: wound care instructions given    History of squamous cell carcinoma in situ (SCCIS) of skin (4) Right Parietal Scalp; Right Occipital Scalp; Mid Occipital Scalp; Right Zygomatic Area  History of squamous cell carcinoma Right Dorsal Hand  History of basal cell carcinoma (BCC) Right Forearm - Anterior  Encounter for screening for malignant neoplasm of skin Mid Back  Annual skin examination.  Wife also regularly checks him.     I, Lavonna Monarch, MD, have reviewed all documentation for this visit.  The documentation on 11/06/20 for the exam, diagnosis, procedures, and orders are all accurate and complete.

## 2020-11-08 DIAGNOSIS — E119 Type 2 diabetes mellitus without complications: Secondary | ICD-10-CM | POA: Diagnosis not present

## 2020-11-08 DIAGNOSIS — E1122 Type 2 diabetes mellitus with diabetic chronic kidney disease: Secondary | ICD-10-CM | POA: Diagnosis not present

## 2020-11-08 DIAGNOSIS — I10 Essential (primary) hypertension: Secondary | ICD-10-CM | POA: Diagnosis not present

## 2020-11-08 DIAGNOSIS — I69328 Other speech and language deficits following cerebral infarction: Secondary | ICD-10-CM | POA: Diagnosis not present

## 2020-11-08 DIAGNOSIS — N183 Chronic kidney disease, stage 3 unspecified: Secondary | ICD-10-CM | POA: Diagnosis not present

## 2020-11-08 DIAGNOSIS — Z4822 Encounter for aftercare following kidney transplant: Secondary | ICD-10-CM | POA: Diagnosis not present

## 2020-11-08 DIAGNOSIS — Z794 Long term (current) use of insulin: Secondary | ICD-10-CM | POA: Diagnosis not present

## 2020-11-08 DIAGNOSIS — Z940 Kidney transplant status: Secondary | ICD-10-CM | POA: Diagnosis not present

## 2020-11-08 DIAGNOSIS — D84821 Immunodeficiency due to drugs: Secondary | ICD-10-CM | POA: Diagnosis not present

## 2020-11-08 DIAGNOSIS — D631 Anemia in chronic kidney disease: Secondary | ICD-10-CM | POA: Diagnosis not present

## 2020-11-08 DIAGNOSIS — Z79899 Other long term (current) drug therapy: Secondary | ICD-10-CM | POA: Diagnosis not present

## 2020-11-08 DIAGNOSIS — Z87891 Personal history of nicotine dependence: Secondary | ICD-10-CM | POA: Diagnosis not present

## 2020-11-08 DIAGNOSIS — I129 Hypertensive chronic kidney disease with stage 1 through stage 4 chronic kidney disease, or unspecified chronic kidney disease: Secondary | ICD-10-CM | POA: Diagnosis not present

## 2020-11-08 DIAGNOSIS — I4891 Unspecified atrial fibrillation: Secondary | ICD-10-CM | POA: Diagnosis not present

## 2020-11-09 DIAGNOSIS — J329 Chronic sinusitis, unspecified: Secondary | ICD-10-CM | POA: Diagnosis not present

## 2020-11-09 DIAGNOSIS — R040 Epistaxis: Secondary | ICD-10-CM | POA: Diagnosis not present

## 2020-11-09 DIAGNOSIS — J330 Polyp of nasal cavity: Secondary | ICD-10-CM | POA: Diagnosis not present

## 2020-11-14 DIAGNOSIS — Z23 Encounter for immunization: Secondary | ICD-10-CM | POA: Diagnosis not present

## 2020-11-23 DIAGNOSIS — Z79899 Other long term (current) drug therapy: Secondary | ICD-10-CM | POA: Diagnosis not present

## 2020-11-23 DIAGNOSIS — I4891 Unspecified atrial fibrillation: Secondary | ICD-10-CM | POA: Diagnosis not present

## 2020-11-30 DIAGNOSIS — H524 Presbyopia: Secondary | ICD-10-CM | POA: Diagnosis not present

## 2020-12-02 DIAGNOSIS — E1122 Type 2 diabetes mellitus with diabetic chronic kidney disease: Secondary | ICD-10-CM | POA: Diagnosis not present

## 2020-12-02 DIAGNOSIS — I482 Chronic atrial fibrillation, unspecified: Secondary | ICD-10-CM | POA: Diagnosis not present

## 2020-12-02 DIAGNOSIS — I129 Hypertensive chronic kidney disease with stage 1 through stage 4 chronic kidney disease, or unspecified chronic kidney disease: Secondary | ICD-10-CM | POA: Diagnosis not present

## 2020-12-02 DIAGNOSIS — N183 Chronic kidney disease, stage 3 unspecified: Secondary | ICD-10-CM | POA: Diagnosis not present

## 2020-12-21 DIAGNOSIS — I69321 Dysphasia following cerebral infarction: Secondary | ICD-10-CM | POA: Diagnosis not present

## 2020-12-21 DIAGNOSIS — R4582 Worries: Secondary | ICD-10-CM | POA: Diagnosis not present

## 2020-12-21 DIAGNOSIS — E7849 Other hyperlipidemia: Secondary | ICD-10-CM | POA: Diagnosis not present

## 2020-12-21 DIAGNOSIS — E1122 Type 2 diabetes mellitus with diabetic chronic kidney disease: Secondary | ICD-10-CM | POA: Diagnosis not present

## 2020-12-21 DIAGNOSIS — Z940 Kidney transplant status: Secondary | ICD-10-CM | POA: Diagnosis not present

## 2020-12-21 DIAGNOSIS — E782 Mixed hyperlipidemia: Secondary | ICD-10-CM | POA: Diagnosis not present

## 2020-12-21 DIAGNOSIS — I482 Chronic atrial fibrillation, unspecified: Secondary | ICD-10-CM | POA: Diagnosis not present

## 2020-12-21 DIAGNOSIS — I10 Essential (primary) hypertension: Secondary | ICD-10-CM | POA: Diagnosis not present

## 2020-12-21 DIAGNOSIS — G4733 Obstructive sleep apnea (adult) (pediatric): Secondary | ICD-10-CM | POA: Diagnosis not present

## 2020-12-21 DIAGNOSIS — M109 Gout, unspecified: Secondary | ICD-10-CM | POA: Diagnosis not present

## 2020-12-21 DIAGNOSIS — E1142 Type 2 diabetes mellitus with diabetic polyneuropathy: Secondary | ICD-10-CM | POA: Diagnosis not present

## 2020-12-21 DIAGNOSIS — I4891 Unspecified atrial fibrillation: Secondary | ICD-10-CM | POA: Diagnosis not present

## 2020-12-21 DIAGNOSIS — G6289 Other specified polyneuropathies: Secondary | ICD-10-CM | POA: Diagnosis not present

## 2020-12-21 DIAGNOSIS — Z79899 Other long term (current) drug therapy: Secondary | ICD-10-CM | POA: Diagnosis not present

## 2020-12-28 DIAGNOSIS — I4891 Unspecified atrial fibrillation: Secondary | ICD-10-CM | POA: Diagnosis not present

## 2020-12-31 DIAGNOSIS — I129 Hypertensive chronic kidney disease with stage 1 through stage 4 chronic kidney disease, or unspecified chronic kidney disease: Secondary | ICD-10-CM | POA: Diagnosis not present

## 2020-12-31 DIAGNOSIS — E1122 Type 2 diabetes mellitus with diabetic chronic kidney disease: Secondary | ICD-10-CM | POA: Diagnosis not present

## 2020-12-31 DIAGNOSIS — I482 Chronic atrial fibrillation, unspecified: Secondary | ICD-10-CM | POA: Diagnosis not present

## 2020-12-31 DIAGNOSIS — N183 Chronic kidney disease, stage 3 unspecified: Secondary | ICD-10-CM | POA: Diagnosis not present

## 2021-01-16 DIAGNOSIS — M1711 Unilateral primary osteoarthritis, right knee: Secondary | ICD-10-CM | POA: Diagnosis not present

## 2021-01-30 DIAGNOSIS — N183 Chronic kidney disease, stage 3 unspecified: Secondary | ICD-10-CM | POA: Diagnosis not present

## 2021-01-30 DIAGNOSIS — I482 Chronic atrial fibrillation, unspecified: Secondary | ICD-10-CM | POA: Diagnosis not present

## 2021-01-30 DIAGNOSIS — I129 Hypertensive chronic kidney disease with stage 1 through stage 4 chronic kidney disease, or unspecified chronic kidney disease: Secondary | ICD-10-CM | POA: Diagnosis not present

## 2021-01-30 DIAGNOSIS — E1122 Type 2 diabetes mellitus with diabetic chronic kidney disease: Secondary | ICD-10-CM | POA: Diagnosis not present

## 2021-02-01 DIAGNOSIS — I4891 Unspecified atrial fibrillation: Secondary | ICD-10-CM | POA: Diagnosis not present

## 2021-02-01 DIAGNOSIS — Z79899 Other long term (current) drug therapy: Secondary | ICD-10-CM | POA: Diagnosis not present

## 2021-02-06 DIAGNOSIS — I4821 Permanent atrial fibrillation: Secondary | ICD-10-CM | POA: Diagnosis not present

## 2021-02-06 DIAGNOSIS — I10 Essential (primary) hypertension: Secondary | ICD-10-CM | POA: Diagnosis not present

## 2021-02-08 DIAGNOSIS — R040 Epistaxis: Secondary | ICD-10-CM | POA: Diagnosis not present

## 2021-02-08 DIAGNOSIS — R0982 Postnasal drip: Secondary | ICD-10-CM | POA: Diagnosis not present

## 2021-02-08 DIAGNOSIS — J329 Chronic sinusitis, unspecified: Secondary | ICD-10-CM | POA: Diagnosis not present

## 2021-02-08 DIAGNOSIS — J330 Polyp of nasal cavity: Secondary | ICD-10-CM | POA: Diagnosis not present

## 2021-02-14 DIAGNOSIS — R059 Cough, unspecified: Secondary | ICD-10-CM | POA: Diagnosis not present

## 2021-02-14 DIAGNOSIS — J209 Acute bronchitis, unspecified: Secondary | ICD-10-CM | POA: Diagnosis not present

## 2021-02-21 DIAGNOSIS — E1151 Type 2 diabetes mellitus with diabetic peripheral angiopathy without gangrene: Secondary | ICD-10-CM | POA: Diagnosis not present

## 2021-02-21 DIAGNOSIS — M109 Gout, unspecified: Secondary | ICD-10-CM | POA: Diagnosis not present

## 2021-02-21 DIAGNOSIS — Z940 Kidney transplant status: Secondary | ICD-10-CM | POA: Diagnosis not present

## 2021-02-21 DIAGNOSIS — B351 Tinea unguium: Secondary | ICD-10-CM | POA: Diagnosis not present

## 2021-02-21 DIAGNOSIS — E1140 Type 2 diabetes mellitus with diabetic neuropathy, unspecified: Secondary | ICD-10-CM | POA: Diagnosis not present

## 2021-02-21 DIAGNOSIS — N183 Chronic kidney disease, stage 3 unspecified: Secondary | ICD-10-CM | POA: Diagnosis not present

## 2021-02-21 DIAGNOSIS — I4891 Unspecified atrial fibrillation: Secondary | ICD-10-CM | POA: Diagnosis not present

## 2021-02-28 DIAGNOSIS — I4891 Unspecified atrial fibrillation: Secondary | ICD-10-CM | POA: Diagnosis not present

## 2021-02-28 DIAGNOSIS — I509 Heart failure, unspecified: Secondary | ICD-10-CM | POA: Diagnosis not present

## 2021-03-01 DIAGNOSIS — J449 Chronic obstructive pulmonary disease, unspecified: Secondary | ICD-10-CM | POA: Diagnosis not present

## 2021-03-01 DIAGNOSIS — R0600 Dyspnea, unspecified: Secondary | ICD-10-CM | POA: Diagnosis not present

## 2021-03-01 DIAGNOSIS — J9621 Acute and chronic respiratory failure with hypoxia: Secondary | ICD-10-CM | POA: Diagnosis not present

## 2021-03-01 DIAGNOSIS — J189 Pneumonia, unspecified organism: Secondary | ICD-10-CM | POA: Diagnosis not present

## 2021-03-03 DIAGNOSIS — I4891 Unspecified atrial fibrillation: Secondary | ICD-10-CM | POA: Diagnosis not present

## 2021-03-07 DIAGNOSIS — I4891 Unspecified atrial fibrillation: Secondary | ICD-10-CM | POA: Diagnosis not present

## 2021-03-09 DIAGNOSIS — J189 Pneumonia, unspecified organism: Secondary | ICD-10-CM | POA: Diagnosis not present

## 2021-03-14 DIAGNOSIS — I4891 Unspecified atrial fibrillation: Secondary | ICD-10-CM | POA: Diagnosis not present

## 2021-03-20 DIAGNOSIS — E782 Mixed hyperlipidemia: Secondary | ICD-10-CM | POA: Diagnosis not present

## 2021-03-20 DIAGNOSIS — D529 Folate deficiency anemia, unspecified: Secondary | ICD-10-CM | POA: Diagnosis not present

## 2021-03-20 DIAGNOSIS — D649 Anemia, unspecified: Secondary | ICD-10-CM | POA: Diagnosis not present

## 2021-03-20 DIAGNOSIS — N183 Chronic kidney disease, stage 3 unspecified: Secondary | ICD-10-CM | POA: Diagnosis not present

## 2021-03-20 DIAGNOSIS — E1122 Type 2 diabetes mellitus with diabetic chronic kidney disease: Secondary | ICD-10-CM | POA: Diagnosis not present

## 2021-03-20 DIAGNOSIS — E7849 Other hyperlipidemia: Secondary | ICD-10-CM | POA: Diagnosis not present

## 2021-03-20 DIAGNOSIS — D519 Vitamin B12 deficiency anemia, unspecified: Secondary | ICD-10-CM | POA: Diagnosis not present

## 2021-03-20 DIAGNOSIS — I4891 Unspecified atrial fibrillation: Secondary | ICD-10-CM | POA: Diagnosis not present

## 2021-03-21 DIAGNOSIS — M25562 Pain in left knee: Secondary | ICD-10-CM | POA: Diagnosis not present

## 2021-03-22 DIAGNOSIS — I4891 Unspecified atrial fibrillation: Secondary | ICD-10-CM | POA: Diagnosis not present

## 2021-03-22 DIAGNOSIS — Z23 Encounter for immunization: Secondary | ICD-10-CM | POA: Diagnosis not present

## 2021-03-22 DIAGNOSIS — Z79899 Other long term (current) drug therapy: Secondary | ICD-10-CM | POA: Diagnosis not present

## 2021-03-22 DIAGNOSIS — E1122 Type 2 diabetes mellitus with diabetic chronic kidney disease: Secondary | ICD-10-CM | POA: Diagnosis not present

## 2021-03-22 DIAGNOSIS — E7849 Other hyperlipidemia: Secondary | ICD-10-CM | POA: Diagnosis not present

## 2021-03-22 DIAGNOSIS — Z940 Kidney transplant status: Secondary | ICD-10-CM | POA: Diagnosis not present

## 2021-03-22 DIAGNOSIS — I10 Essential (primary) hypertension: Secondary | ICD-10-CM | POA: Diagnosis not present

## 2021-03-22 DIAGNOSIS — E1142 Type 2 diabetes mellitus with diabetic polyneuropathy: Secondary | ICD-10-CM | POA: Diagnosis not present

## 2021-03-24 DIAGNOSIS — Z940 Kidney transplant status: Secondary | ICD-10-CM | POA: Diagnosis not present

## 2021-03-24 DIAGNOSIS — N184 Chronic kidney disease, stage 4 (severe): Secondary | ICD-10-CM | POA: Diagnosis not present

## 2021-03-24 DIAGNOSIS — M62562 Muscle wasting and atrophy, not elsewhere classified, left lower leg: Secondary | ICD-10-CM | POA: Diagnosis not present

## 2021-03-24 DIAGNOSIS — N2581 Secondary hyperparathyroidism of renal origin: Secondary | ICD-10-CM | POA: Diagnosis not present

## 2021-03-24 DIAGNOSIS — M19011 Primary osteoarthritis, right shoulder: Secondary | ICD-10-CM | POA: Diagnosis not present

## 2021-03-24 DIAGNOSIS — D638 Anemia in other chronic diseases classified elsewhere: Secondary | ICD-10-CM | POA: Diagnosis not present

## 2021-03-24 DIAGNOSIS — M19072 Primary osteoarthritis, left ankle and foot: Secondary | ICD-10-CM | POA: Diagnosis not present

## 2021-03-24 DIAGNOSIS — R600 Localized edema: Secondary | ICD-10-CM | POA: Diagnosis not present

## 2021-03-24 DIAGNOSIS — E1121 Type 2 diabetes mellitus with diabetic nephropathy: Secondary | ICD-10-CM | POA: Diagnosis not present

## 2021-03-24 DIAGNOSIS — I10 Essential (primary) hypertension: Secondary | ICD-10-CM | POA: Diagnosis not present

## 2021-03-24 DIAGNOSIS — I4811 Longstanding persistent atrial fibrillation: Secondary | ICD-10-CM | POA: Diagnosis not present

## 2021-03-24 DIAGNOSIS — I4821 Permanent atrial fibrillation: Secondary | ICD-10-CM | POA: Diagnosis not present

## 2021-03-24 DIAGNOSIS — Z872 Personal history of diseases of the skin and subcutaneous tissue: Secondary | ICD-10-CM | POA: Diagnosis not present

## 2021-03-24 DIAGNOSIS — W19XXXA Unspecified fall, initial encounter: Secondary | ICD-10-CM | POA: Diagnosis not present

## 2021-03-24 DIAGNOSIS — Z794 Long term (current) use of insulin: Secondary | ICD-10-CM | POA: Diagnosis not present

## 2021-03-24 DIAGNOSIS — M19012 Primary osteoarthritis, left shoulder: Secondary | ICD-10-CM | POA: Diagnosis not present

## 2021-03-24 DIAGNOSIS — L03116 Cellulitis of left lower limb: Secondary | ICD-10-CM | POA: Diagnosis not present

## 2021-03-24 DIAGNOSIS — Z20822 Contact with and (suspected) exposure to covid-19: Secondary | ICD-10-CM | POA: Diagnosis not present

## 2021-03-24 DIAGNOSIS — R918 Other nonspecific abnormal finding of lung field: Secondary | ICD-10-CM | POA: Diagnosis not present

## 2021-03-25 DIAGNOSIS — G4733 Obstructive sleep apnea (adult) (pediatric): Secondary | ICD-10-CM | POA: Diagnosis present

## 2021-03-25 DIAGNOSIS — H9193 Unspecified hearing loss, bilateral: Secondary | ICD-10-CM | POA: Diagnosis present

## 2021-03-25 DIAGNOSIS — I4821 Permanent atrial fibrillation: Secondary | ICD-10-CM | POA: Diagnosis present

## 2021-03-25 DIAGNOSIS — Z8673 Personal history of transient ischemic attack (TIA), and cerebral infarction without residual deficits: Secondary | ICD-10-CM | POA: Diagnosis not present

## 2021-03-25 DIAGNOSIS — N2581 Secondary hyperparathyroidism of renal origin: Secondary | ICD-10-CM | POA: Diagnosis present

## 2021-03-25 DIAGNOSIS — Z7901 Long term (current) use of anticoagulants: Secondary | ICD-10-CM | POA: Diagnosis not present

## 2021-03-25 DIAGNOSIS — L03116 Cellulitis of left lower limb: Secondary | ICD-10-CM | POA: Diagnosis present

## 2021-03-25 DIAGNOSIS — J449 Chronic obstructive pulmonary disease, unspecified: Secondary | ICD-10-CM | POA: Diagnosis present

## 2021-03-25 DIAGNOSIS — E785 Hyperlipidemia, unspecified: Secondary | ICD-10-CM | POA: Diagnosis present

## 2021-03-25 DIAGNOSIS — E1140 Type 2 diabetes mellitus with diabetic neuropathy, unspecified: Secondary | ICD-10-CM | POA: Diagnosis present

## 2021-03-25 DIAGNOSIS — Z940 Kidney transplant status: Secondary | ICD-10-CM | POA: Diagnosis not present

## 2021-03-25 DIAGNOSIS — I10 Essential (primary) hypertension: Secondary | ICD-10-CM | POA: Diagnosis present

## 2021-03-25 DIAGNOSIS — Z87891 Personal history of nicotine dependence: Secondary | ICD-10-CM | POA: Diagnosis not present

## 2021-03-25 DIAGNOSIS — Z794 Long term (current) use of insulin: Secondary | ICD-10-CM | POA: Diagnosis not present

## 2021-03-25 DIAGNOSIS — M109 Gout, unspecified: Secondary | ICD-10-CM | POA: Diagnosis present

## 2021-03-25 DIAGNOSIS — E1121 Type 2 diabetes mellitus with diabetic nephropathy: Secondary | ICD-10-CM | POA: Diagnosis present

## 2021-03-25 DIAGNOSIS — D638 Anemia in other chronic diseases classified elsewhere: Secondary | ICD-10-CM | POA: Diagnosis present

## 2021-03-25 DIAGNOSIS — Z20822 Contact with and (suspected) exposure to covid-19: Secondary | ICD-10-CM | POA: Diagnosis present

## 2021-03-31 DIAGNOSIS — N528 Other male erectile dysfunction: Secondary | ICD-10-CM | POA: Diagnosis not present

## 2021-03-31 DIAGNOSIS — N401 Enlarged prostate with lower urinary tract symptoms: Secondary | ICD-10-CM | POA: Diagnosis not present

## 2021-03-31 DIAGNOSIS — R3914 Feeling of incomplete bladder emptying: Secondary | ICD-10-CM | POA: Diagnosis not present

## 2021-04-01 DIAGNOSIS — E1140 Type 2 diabetes mellitus with diabetic neuropathy, unspecified: Secondary | ICD-10-CM | POA: Diagnosis not present

## 2021-04-01 DIAGNOSIS — I482 Chronic atrial fibrillation, unspecified: Secondary | ICD-10-CM | POA: Diagnosis not present

## 2021-04-01 DIAGNOSIS — N189 Chronic kidney disease, unspecified: Secondary | ICD-10-CM | POA: Diagnosis not present

## 2021-04-01 DIAGNOSIS — Z9181 History of falling: Secondary | ICD-10-CM | POA: Diagnosis not present

## 2021-04-01 DIAGNOSIS — I4811 Longstanding persistent atrial fibrillation: Secondary | ICD-10-CM | POA: Diagnosis not present

## 2021-04-01 DIAGNOSIS — I69328 Other speech and language deficits following cerebral infarction: Secondary | ICD-10-CM | POA: Diagnosis not present

## 2021-04-01 DIAGNOSIS — Z7901 Long term (current) use of anticoagulants: Secondary | ICD-10-CM | POA: Diagnosis not present

## 2021-04-01 DIAGNOSIS — N183 Chronic kidney disease, stage 3 unspecified: Secondary | ICD-10-CM | POA: Diagnosis not present

## 2021-04-01 DIAGNOSIS — Z940 Kidney transplant status: Secondary | ICD-10-CM | POA: Diagnosis not present

## 2021-04-01 DIAGNOSIS — J449 Chronic obstructive pulmonary disease, unspecified: Secondary | ICD-10-CM | POA: Diagnosis not present

## 2021-04-01 DIAGNOSIS — E1122 Type 2 diabetes mellitus with diabetic chronic kidney disease: Secondary | ICD-10-CM | POA: Diagnosis not present

## 2021-04-01 DIAGNOSIS — Z9981 Dependence on supplemental oxygen: Secondary | ICD-10-CM | POA: Diagnosis not present

## 2021-04-01 DIAGNOSIS — L03116 Cellulitis of left lower limb: Secondary | ICD-10-CM | POA: Diagnosis not present

## 2021-04-01 DIAGNOSIS — N2581 Secondary hyperparathyroidism of renal origin: Secondary | ICD-10-CM | POA: Diagnosis not present

## 2021-04-01 DIAGNOSIS — Z87891 Personal history of nicotine dependence: Secondary | ICD-10-CM | POA: Diagnosis not present

## 2021-04-01 DIAGNOSIS — D631 Anemia in chronic kidney disease: Secondary | ICD-10-CM | POA: Diagnosis not present

## 2021-04-01 DIAGNOSIS — I129 Hypertensive chronic kidney disease with stage 1 through stage 4 chronic kidney disease, or unspecified chronic kidney disease: Secondary | ICD-10-CM | POA: Diagnosis not present

## 2021-04-01 DIAGNOSIS — Z794 Long term (current) use of insulin: Secondary | ICD-10-CM | POA: Diagnosis not present

## 2021-04-03 DIAGNOSIS — Z940 Kidney transplant status: Secondary | ICD-10-CM | POA: Diagnosis not present

## 2021-04-03 DIAGNOSIS — E1122 Type 2 diabetes mellitus with diabetic chronic kidney disease: Secondary | ICD-10-CM | POA: Diagnosis not present

## 2021-04-03 DIAGNOSIS — D631 Anemia in chronic kidney disease: Secondary | ICD-10-CM | POA: Diagnosis not present

## 2021-04-03 DIAGNOSIS — D84821 Immunodeficiency due to drugs: Secondary | ICD-10-CM | POA: Diagnosis not present

## 2021-04-03 DIAGNOSIS — Z794 Long term (current) use of insulin: Secondary | ICD-10-CM | POA: Diagnosis not present

## 2021-04-03 DIAGNOSIS — I129 Hypertensive chronic kidney disease with stage 1 through stage 4 chronic kidney disease, or unspecified chronic kidney disease: Secondary | ICD-10-CM | POA: Diagnosis not present

## 2021-04-03 DIAGNOSIS — E785 Hyperlipidemia, unspecified: Secondary | ICD-10-CM | POA: Diagnosis not present

## 2021-04-03 DIAGNOSIS — N400 Enlarged prostate without lower urinary tract symptoms: Secondary | ICD-10-CM | POA: Diagnosis not present

## 2021-04-03 DIAGNOSIS — I4891 Unspecified atrial fibrillation: Secondary | ICD-10-CM | POA: Diagnosis not present

## 2021-04-03 DIAGNOSIS — Z79899 Other long term (current) drug therapy: Secondary | ICD-10-CM | POA: Diagnosis not present

## 2021-04-03 DIAGNOSIS — Z888 Allergy status to other drugs, medicaments and biological substances status: Secondary | ICD-10-CM | POA: Diagnosis not present

## 2021-04-03 DIAGNOSIS — N2581 Secondary hyperparathyroidism of renal origin: Secondary | ICD-10-CM | POA: Diagnosis not present

## 2021-04-03 DIAGNOSIS — E8809 Other disorders of plasma-protein metabolism, not elsewhere classified: Secondary | ICD-10-CM | POA: Diagnosis not present

## 2021-04-03 DIAGNOSIS — M109 Gout, unspecified: Secondary | ICD-10-CM | POA: Diagnosis not present

## 2021-04-03 DIAGNOSIS — N1832 Chronic kidney disease, stage 3b: Secondary | ICD-10-CM | POA: Diagnosis not present

## 2021-04-04 DIAGNOSIS — I4811 Longstanding persistent atrial fibrillation: Secondary | ICD-10-CM | POA: Diagnosis not present

## 2021-04-04 DIAGNOSIS — N189 Chronic kidney disease, unspecified: Secondary | ICD-10-CM | POA: Diagnosis not present

## 2021-04-04 DIAGNOSIS — E1122 Type 2 diabetes mellitus with diabetic chronic kidney disease: Secondary | ICD-10-CM | POA: Diagnosis not present

## 2021-04-04 DIAGNOSIS — D631 Anemia in chronic kidney disease: Secondary | ICD-10-CM | POA: Diagnosis not present

## 2021-04-04 DIAGNOSIS — I129 Hypertensive chronic kidney disease with stage 1 through stage 4 chronic kidney disease, or unspecified chronic kidney disease: Secondary | ICD-10-CM | POA: Diagnosis not present

## 2021-04-04 DIAGNOSIS — L03116 Cellulitis of left lower limb: Secondary | ICD-10-CM | POA: Diagnosis not present

## 2021-04-05 DIAGNOSIS — E1122 Type 2 diabetes mellitus with diabetic chronic kidney disease: Secondary | ICD-10-CM | POA: Diagnosis not present

## 2021-04-05 DIAGNOSIS — I129 Hypertensive chronic kidney disease with stage 1 through stage 4 chronic kidney disease, or unspecified chronic kidney disease: Secondary | ICD-10-CM | POA: Diagnosis not present

## 2021-04-05 DIAGNOSIS — D631 Anemia in chronic kidney disease: Secondary | ICD-10-CM | POA: Diagnosis not present

## 2021-04-05 DIAGNOSIS — N189 Chronic kidney disease, unspecified: Secondary | ICD-10-CM | POA: Diagnosis not present

## 2021-04-05 DIAGNOSIS — I4811 Longstanding persistent atrial fibrillation: Secondary | ICD-10-CM | POA: Diagnosis not present

## 2021-04-05 DIAGNOSIS — L03116 Cellulitis of left lower limb: Secondary | ICD-10-CM | POA: Diagnosis not present

## 2021-04-10 DIAGNOSIS — S8012XD Contusion of left lower leg, subsequent encounter: Secondary | ICD-10-CM | POA: Diagnosis not present

## 2021-04-11 DIAGNOSIS — N189 Chronic kidney disease, unspecified: Secondary | ICD-10-CM | POA: Diagnosis not present

## 2021-04-11 DIAGNOSIS — I129 Hypertensive chronic kidney disease with stage 1 through stage 4 chronic kidney disease, or unspecified chronic kidney disease: Secondary | ICD-10-CM | POA: Diagnosis not present

## 2021-04-11 DIAGNOSIS — I4811 Longstanding persistent atrial fibrillation: Secondary | ICD-10-CM | POA: Diagnosis not present

## 2021-04-11 DIAGNOSIS — D631 Anemia in chronic kidney disease: Secondary | ICD-10-CM | POA: Diagnosis not present

## 2021-04-11 DIAGNOSIS — E1122 Type 2 diabetes mellitus with diabetic chronic kidney disease: Secondary | ICD-10-CM | POA: Diagnosis not present

## 2021-04-11 DIAGNOSIS — L03116 Cellulitis of left lower limb: Secondary | ICD-10-CM | POA: Diagnosis not present

## 2021-04-12 DIAGNOSIS — D631 Anemia in chronic kidney disease: Secondary | ICD-10-CM | POA: Diagnosis not present

## 2021-04-12 DIAGNOSIS — I4811 Longstanding persistent atrial fibrillation: Secondary | ICD-10-CM | POA: Diagnosis not present

## 2021-04-12 DIAGNOSIS — N189 Chronic kidney disease, unspecified: Secondary | ICD-10-CM | POA: Diagnosis not present

## 2021-04-12 DIAGNOSIS — E1122 Type 2 diabetes mellitus with diabetic chronic kidney disease: Secondary | ICD-10-CM | POA: Diagnosis not present

## 2021-04-12 DIAGNOSIS — L03116 Cellulitis of left lower limb: Secondary | ICD-10-CM | POA: Diagnosis not present

## 2021-04-12 DIAGNOSIS — I129 Hypertensive chronic kidney disease with stage 1 through stage 4 chronic kidney disease, or unspecified chronic kidney disease: Secondary | ICD-10-CM | POA: Diagnosis not present

## 2021-04-18 DIAGNOSIS — J9621 Acute and chronic respiratory failure with hypoxia: Secondary | ICD-10-CM | POA: Diagnosis not present

## 2021-04-18 DIAGNOSIS — Z77090 Contact with and (suspected) exposure to asbestos: Secondary | ICD-10-CM | POA: Diagnosis not present

## 2021-04-18 DIAGNOSIS — J449 Chronic obstructive pulmonary disease, unspecified: Secondary | ICD-10-CM | POA: Diagnosis not present

## 2021-04-18 DIAGNOSIS — J189 Pneumonia, unspecified organism: Secondary | ICD-10-CM | POA: Diagnosis not present

## 2021-04-18 DIAGNOSIS — R0600 Dyspnea, unspecified: Secondary | ICD-10-CM | POA: Diagnosis not present

## 2021-04-19 DIAGNOSIS — E1122 Type 2 diabetes mellitus with diabetic chronic kidney disease: Secondary | ICD-10-CM | POA: Diagnosis not present

## 2021-04-19 DIAGNOSIS — Z940 Kidney transplant status: Secondary | ICD-10-CM | POA: Diagnosis not present

## 2021-04-19 DIAGNOSIS — I4891 Unspecified atrial fibrillation: Secondary | ICD-10-CM | POA: Diagnosis not present

## 2021-04-19 DIAGNOSIS — E785 Hyperlipidemia, unspecified: Secondary | ICD-10-CM | POA: Diagnosis not present

## 2021-04-19 DIAGNOSIS — N189 Chronic kidney disease, unspecified: Secondary | ICD-10-CM | POA: Diagnosis not present

## 2021-04-19 DIAGNOSIS — G4730 Sleep apnea, unspecified: Secondary | ICD-10-CM | POA: Diagnosis not present

## 2021-04-19 DIAGNOSIS — J449 Chronic obstructive pulmonary disease, unspecified: Secondary | ICD-10-CM | POA: Diagnosis not present

## 2021-04-19 DIAGNOSIS — Z8673 Personal history of transient ischemic attack (TIA), and cerebral infarction without residual deficits: Secondary | ICD-10-CM | POA: Diagnosis not present

## 2021-04-19 DIAGNOSIS — R112 Nausea with vomiting, unspecified: Secondary | ICD-10-CM | POA: Diagnosis not present

## 2021-04-19 DIAGNOSIS — I129 Hypertensive chronic kidney disease with stage 1 through stage 4 chronic kidney disease, or unspecified chronic kidney disease: Secondary | ICD-10-CM | POA: Diagnosis not present

## 2021-04-19 DIAGNOSIS — R531 Weakness: Secondary | ICD-10-CM | POA: Diagnosis not present

## 2021-04-19 DIAGNOSIS — E871 Hypo-osmolality and hyponatremia: Secondary | ICD-10-CM | POA: Diagnosis not present

## 2021-04-19 DIAGNOSIS — E1140 Type 2 diabetes mellitus with diabetic neuropathy, unspecified: Secondary | ICD-10-CM | POA: Diagnosis not present

## 2021-04-19 DIAGNOSIS — Z87891 Personal history of nicotine dependence: Secondary | ICD-10-CM | POA: Diagnosis not present

## 2021-04-19 DIAGNOSIS — Z20822 Contact with and (suspected) exposure to covid-19: Secondary | ICD-10-CM | POA: Diagnosis not present

## 2021-04-19 DIAGNOSIS — Z79899 Other long term (current) drug therapy: Secondary | ICD-10-CM | POA: Diagnosis not present

## 2021-04-19 DIAGNOSIS — R197 Diarrhea, unspecified: Secondary | ICD-10-CM | POA: Diagnosis not present

## 2021-04-19 DIAGNOSIS — Z794 Long term (current) use of insulin: Secondary | ICD-10-CM | POA: Diagnosis not present

## 2021-04-21 DIAGNOSIS — N189 Chronic kidney disease, unspecified: Secondary | ICD-10-CM | POA: Diagnosis not present

## 2021-04-21 DIAGNOSIS — L03116 Cellulitis of left lower limb: Secondary | ICD-10-CM | POA: Diagnosis not present

## 2021-04-21 DIAGNOSIS — I129 Hypertensive chronic kidney disease with stage 1 through stage 4 chronic kidney disease, or unspecified chronic kidney disease: Secondary | ICD-10-CM | POA: Diagnosis not present

## 2021-04-21 DIAGNOSIS — E1122 Type 2 diabetes mellitus with diabetic chronic kidney disease: Secondary | ICD-10-CM | POA: Diagnosis not present

## 2021-04-21 DIAGNOSIS — D631 Anemia in chronic kidney disease: Secondary | ICD-10-CM | POA: Diagnosis not present

## 2021-04-21 DIAGNOSIS — I4811 Longstanding persistent atrial fibrillation: Secondary | ICD-10-CM | POA: Diagnosis not present

## 2021-04-22 DIAGNOSIS — R42 Dizziness and giddiness: Secondary | ICD-10-CM | POA: Diagnosis not present

## 2021-04-22 DIAGNOSIS — Z049 Encounter for examination and observation for unspecified reason: Secondary | ICD-10-CM | POA: Diagnosis not present

## 2021-04-22 DIAGNOSIS — D84821 Immunodeficiency due to drugs: Secondary | ICD-10-CM | POA: Diagnosis present

## 2021-04-22 DIAGNOSIS — J449 Chronic obstructive pulmonary disease, unspecified: Secondary | ICD-10-CM | POA: Diagnosis not present

## 2021-04-22 DIAGNOSIS — I129 Hypertensive chronic kidney disease with stage 1 through stage 4 chronic kidney disease, or unspecified chronic kidney disease: Secondary | ICD-10-CM | POA: Diagnosis not present

## 2021-04-22 DIAGNOSIS — M533 Sacrococcygeal disorders, not elsewhere classified: Secondary | ICD-10-CM | POA: Diagnosis not present

## 2021-04-22 DIAGNOSIS — Z79899 Other long term (current) drug therapy: Secondary | ICD-10-CM | POA: Diagnosis not present

## 2021-04-22 DIAGNOSIS — I4819 Other persistent atrial fibrillation: Secondary | ICD-10-CM | POA: Diagnosis present

## 2021-04-22 DIAGNOSIS — N2581 Secondary hyperparathyroidism of renal origin: Secondary | ICD-10-CM | POA: Diagnosis present

## 2021-04-22 DIAGNOSIS — I493 Ventricular premature depolarization: Secondary | ICD-10-CM | POA: Diagnosis not present

## 2021-04-22 DIAGNOSIS — E1121 Type 2 diabetes mellitus with diabetic nephropathy: Secondary | ICD-10-CM | POA: Diagnosis present

## 2021-04-22 DIAGNOSIS — G9389 Other specified disorders of brain: Secondary | ICD-10-CM | POA: Diagnosis not present

## 2021-04-22 DIAGNOSIS — Z20822 Contact with and (suspected) exposure to covid-19: Secondary | ICD-10-CM | POA: Diagnosis not present

## 2021-04-22 DIAGNOSIS — I10 Essential (primary) hypertension: Secondary | ICD-10-CM | POA: Diagnosis present

## 2021-04-22 DIAGNOSIS — G238 Other specified degenerative diseases of basal ganglia: Secondary | ICD-10-CM | POA: Diagnosis not present

## 2021-04-22 DIAGNOSIS — K5900 Constipation, unspecified: Secondary | ICD-10-CM | POA: Diagnosis present

## 2021-04-22 DIAGNOSIS — M160 Bilateral primary osteoarthritis of hip: Secondary | ICD-10-CM | POA: Diagnosis not present

## 2021-04-22 DIAGNOSIS — I739 Peripheral vascular disease, unspecified: Secondary | ICD-10-CM | POA: Diagnosis not present

## 2021-04-22 DIAGNOSIS — M25521 Pain in right elbow: Secondary | ICD-10-CM | POA: Diagnosis not present

## 2021-04-22 DIAGNOSIS — Z992 Dependence on renal dialysis: Secondary | ICD-10-CM | POA: Diagnosis not present

## 2021-04-22 DIAGNOSIS — T451X5A Adverse effect of antineoplastic and immunosuppressive drugs, initial encounter: Secondary | ICD-10-CM | POA: Diagnosis present

## 2021-04-22 DIAGNOSIS — I4811 Longstanding persistent atrial fibrillation: Secondary | ICD-10-CM | POA: Diagnosis not present

## 2021-04-22 DIAGNOSIS — Z9181 History of falling: Secondary | ICD-10-CM | POA: Diagnosis not present

## 2021-04-22 DIAGNOSIS — N1832 Chronic kidney disease, stage 3b: Secondary | ICD-10-CM | POA: Diagnosis not present

## 2021-04-22 DIAGNOSIS — R531 Weakness: Secondary | ICD-10-CM | POA: Diagnosis not present

## 2021-04-22 DIAGNOSIS — N138 Other obstructive and reflux uropathy: Secondary | ICD-10-CM | POA: Diagnosis present

## 2021-04-22 DIAGNOSIS — N179 Acute kidney failure, unspecified: Secondary | ICD-10-CM | POA: Diagnosis present

## 2021-04-22 DIAGNOSIS — E871 Hypo-osmolality and hyponatremia: Secondary | ICD-10-CM | POA: Diagnosis present

## 2021-04-22 DIAGNOSIS — R7989 Other specified abnormal findings of blood chemistry: Secondary | ICD-10-CM | POA: Diagnosis not present

## 2021-04-22 DIAGNOSIS — Z940 Kidney transplant status: Secondary | ICD-10-CM | POA: Diagnosis not present

## 2021-04-22 DIAGNOSIS — G319 Degenerative disease of nervous system, unspecified: Secondary | ICD-10-CM | POA: Diagnosis not present

## 2021-04-22 DIAGNOSIS — Z794 Long term (current) use of insulin: Secondary | ICD-10-CM | POA: Diagnosis not present

## 2021-04-22 DIAGNOSIS — Z87891 Personal history of nicotine dependence: Secondary | ICD-10-CM | POA: Diagnosis not present

## 2021-04-22 DIAGNOSIS — Z8673 Personal history of transient ischemic attack (TIA), and cerebral infarction without residual deficits: Secondary | ICD-10-CM | POA: Diagnosis not present

## 2021-04-22 DIAGNOSIS — S51011A Laceration without foreign body of right elbow, initial encounter: Secondary | ICD-10-CM | POA: Diagnosis not present

## 2021-04-22 DIAGNOSIS — T451X5D Adverse effect of antineoplastic and immunosuppressive drugs, subsequent encounter: Secondary | ICD-10-CM | POA: Diagnosis not present

## 2021-04-22 DIAGNOSIS — J9811 Atelectasis: Secondary | ICD-10-CM | POA: Diagnosis not present

## 2021-04-22 DIAGNOSIS — T8619 Other complication of kidney transplant: Secondary | ICD-10-CM | POA: Diagnosis present

## 2021-04-22 DIAGNOSIS — E1122 Type 2 diabetes mellitus with diabetic chronic kidney disease: Secondary | ICD-10-CM | POA: Diagnosis not present

## 2021-04-22 DIAGNOSIS — R600 Localized edema: Secondary | ICD-10-CM | POA: Diagnosis not present

## 2021-04-22 DIAGNOSIS — E785 Hyperlipidemia, unspecified: Secondary | ICD-10-CM | POA: Diagnosis present

## 2021-04-22 DIAGNOSIS — N400 Enlarged prostate without lower urinary tract symptoms: Secondary | ICD-10-CM | POA: Diagnosis not present

## 2021-04-22 DIAGNOSIS — W1839XA Other fall on same level, initial encounter: Secondary | ICD-10-CM | POA: Diagnosis not present

## 2021-04-22 DIAGNOSIS — I4891 Unspecified atrial fibrillation: Secondary | ICD-10-CM | POA: Diagnosis not present

## 2021-04-22 DIAGNOSIS — S0990XA Unspecified injury of head, initial encounter: Secondary | ICD-10-CM | POA: Diagnosis not present

## 2021-04-22 DIAGNOSIS — N189 Chronic kidney disease, unspecified: Secondary | ICD-10-CM | POA: Diagnosis not present

## 2021-04-22 DIAGNOSIS — R7982 Elevated C-reactive protein (CRP): Secondary | ICD-10-CM | POA: Diagnosis not present

## 2021-04-22 DIAGNOSIS — Z7901 Long term (current) use of anticoagulants: Secondary | ICD-10-CM | POA: Diagnosis not present

## 2021-04-26 DIAGNOSIS — Z9181 History of falling: Secondary | ICD-10-CM | POA: Diagnosis not present

## 2021-04-26 DIAGNOSIS — I4891 Unspecified atrial fibrillation: Secondary | ICD-10-CM | POA: Diagnosis not present

## 2021-04-26 DIAGNOSIS — L03116 Cellulitis of left lower limb: Secondary | ICD-10-CM | POA: Diagnosis not present

## 2021-04-27 DIAGNOSIS — I129 Hypertensive chronic kidney disease with stage 1 through stage 4 chronic kidney disease, or unspecified chronic kidney disease: Secondary | ICD-10-CM | POA: Diagnosis not present

## 2021-04-27 DIAGNOSIS — I4811 Longstanding persistent atrial fibrillation: Secondary | ICD-10-CM | POA: Diagnosis not present

## 2021-04-27 DIAGNOSIS — D631 Anemia in chronic kidney disease: Secondary | ICD-10-CM | POA: Diagnosis not present

## 2021-04-27 DIAGNOSIS — N189 Chronic kidney disease, unspecified: Secondary | ICD-10-CM | POA: Diagnosis not present

## 2021-04-27 DIAGNOSIS — E1122 Type 2 diabetes mellitus with diabetic chronic kidney disease: Secondary | ICD-10-CM | POA: Diagnosis not present

## 2021-04-27 DIAGNOSIS — L03116 Cellulitis of left lower limb: Secondary | ICD-10-CM | POA: Diagnosis not present

## 2021-04-28 DIAGNOSIS — N138 Other obstructive and reflux uropathy: Secondary | ICD-10-CM | POA: Diagnosis not present

## 2021-04-28 DIAGNOSIS — Z9181 History of falling: Secondary | ICD-10-CM | POA: Diagnosis not present

## 2021-04-28 DIAGNOSIS — Z20822 Contact with and (suspected) exposure to covid-19: Secondary | ICD-10-CM | POA: Diagnosis not present

## 2021-04-28 DIAGNOSIS — N401 Enlarged prostate with lower urinary tract symptoms: Secondary | ICD-10-CM | POA: Diagnosis present

## 2021-04-28 DIAGNOSIS — K5909 Other constipation: Secondary | ICD-10-CM | POA: Diagnosis present

## 2021-04-28 DIAGNOSIS — R319 Hematuria, unspecified: Secondary | ICD-10-CM | POA: Diagnosis present

## 2021-04-28 DIAGNOSIS — L03116 Cellulitis of left lower limb: Secondary | ICD-10-CM | POA: Diagnosis not present

## 2021-04-28 DIAGNOSIS — Z940 Kidney transplant status: Secondary | ICD-10-CM | POA: Diagnosis not present

## 2021-04-28 DIAGNOSIS — M7989 Other specified soft tissue disorders: Secondary | ICD-10-CM | POA: Diagnosis present

## 2021-04-28 DIAGNOSIS — I4819 Other persistent atrial fibrillation: Secondary | ICD-10-CM | POA: Diagnosis present

## 2021-04-28 DIAGNOSIS — N1832 Chronic kidney disease, stage 3b: Secondary | ICD-10-CM | POA: Diagnosis not present

## 2021-04-28 DIAGNOSIS — N390 Urinary tract infection, site not specified: Secondary | ICD-10-CM | POA: Diagnosis present

## 2021-04-28 DIAGNOSIS — R350 Frequency of micturition: Secondary | ICD-10-CM | POA: Diagnosis not present

## 2021-04-28 DIAGNOSIS — J810 Acute pulmonary edema: Secondary | ICD-10-CM | POA: Diagnosis not present

## 2021-04-28 DIAGNOSIS — D631 Anemia in chronic kidney disease: Secondary | ICD-10-CM | POA: Diagnosis not present

## 2021-04-28 DIAGNOSIS — R0602 Shortness of breath: Secondary | ICD-10-CM | POA: Diagnosis not present

## 2021-04-28 DIAGNOSIS — I350 Nonrheumatic aortic (valve) stenosis: Secondary | ICD-10-CM | POA: Diagnosis present

## 2021-04-28 DIAGNOSIS — E8809 Other disorders of plasma-protein metabolism, not elsewhere classified: Secondary | ICD-10-CM | POA: Diagnosis present

## 2021-04-28 DIAGNOSIS — E1122 Type 2 diabetes mellitus with diabetic chronic kidney disease: Secondary | ICD-10-CM | POA: Diagnosis not present

## 2021-04-28 DIAGNOSIS — T8619 Other complication of kidney transplant: Secondary | ICD-10-CM | POA: Diagnosis present

## 2021-04-28 DIAGNOSIS — R600 Localized edema: Secondary | ICD-10-CM | POA: Diagnosis not present

## 2021-04-28 DIAGNOSIS — R918 Other nonspecific abnormal finding of lung field: Secondary | ICD-10-CM | POA: Diagnosis not present

## 2021-04-28 DIAGNOSIS — E1140 Type 2 diabetes mellitus with diabetic neuropathy, unspecified: Secondary | ICD-10-CM | POA: Diagnosis not present

## 2021-04-28 DIAGNOSIS — E785 Hyperlipidemia, unspecified: Secondary | ICD-10-CM | POA: Diagnosis present

## 2021-04-28 DIAGNOSIS — N2581 Secondary hyperparathyroidism of renal origin: Secondary | ICD-10-CM | POA: Diagnosis present

## 2021-04-28 DIAGNOSIS — N3001 Acute cystitis with hematuria: Secondary | ICD-10-CM | POA: Diagnosis not present

## 2021-04-28 DIAGNOSIS — R1310 Dysphagia, unspecified: Secondary | ICD-10-CM | POA: Diagnosis present

## 2021-04-28 DIAGNOSIS — I493 Ventricular premature depolarization: Secondary | ICD-10-CM | POA: Diagnosis not present

## 2021-04-28 DIAGNOSIS — I10 Essential (primary) hypertension: Secondary | ICD-10-CM | POA: Diagnosis present

## 2021-04-28 DIAGNOSIS — N179 Acute kidney failure, unspecified: Secondary | ICD-10-CM | POA: Diagnosis not present

## 2021-04-28 DIAGNOSIS — I69328 Other speech and language deficits following cerebral infarction: Secondary | ICD-10-CM | POA: Diagnosis not present

## 2021-04-28 DIAGNOSIS — I4891 Unspecified atrial fibrillation: Secondary | ICD-10-CM | POA: Diagnosis not present

## 2021-04-28 DIAGNOSIS — Z79899 Other long term (current) drug therapy: Secondary | ICD-10-CM | POA: Diagnosis not present

## 2021-04-28 DIAGNOSIS — Z87891 Personal history of nicotine dependence: Secondary | ICD-10-CM | POA: Diagnosis not present

## 2021-04-28 DIAGNOSIS — I4811 Longstanding persistent atrial fibrillation: Secondary | ICD-10-CM | POA: Diagnosis not present

## 2021-04-28 DIAGNOSIS — R0600 Dyspnea, unspecified: Secondary | ICD-10-CM | POA: Diagnosis not present

## 2021-04-28 DIAGNOSIS — N183 Chronic kidney disease, stage 3 unspecified: Secondary | ICD-10-CM | POA: Diagnosis not present

## 2021-04-28 DIAGNOSIS — J449 Chronic obstructive pulmonary disease, unspecified: Secondary | ICD-10-CM | POA: Diagnosis present

## 2021-04-28 DIAGNOSIS — Z9981 Dependence on supplemental oxygen: Secondary | ICD-10-CM | POA: Diagnosis not present

## 2021-04-28 DIAGNOSIS — E871 Hypo-osmolality and hyponatremia: Secondary | ICD-10-CM | POA: Diagnosis not present

## 2021-04-28 DIAGNOSIS — N189 Chronic kidney disease, unspecified: Secondary | ICD-10-CM | POA: Diagnosis not present

## 2021-04-28 DIAGNOSIS — Z794 Long term (current) use of insulin: Secondary | ICD-10-CM | POA: Diagnosis not present

## 2021-04-28 DIAGNOSIS — J90 Pleural effusion, not elsewhere classified: Secondary | ICD-10-CM | POA: Diagnosis not present

## 2021-04-28 DIAGNOSIS — B9689 Other specified bacterial agents as the cause of diseases classified elsewhere: Secondary | ICD-10-CM | POA: Diagnosis present

## 2021-04-28 DIAGNOSIS — Z7901 Long term (current) use of anticoagulants: Secondary | ICD-10-CM | POA: Diagnosis not present

## 2021-04-28 DIAGNOSIS — I482 Chronic atrial fibrillation, unspecified: Secondary | ICD-10-CM | POA: Diagnosis not present

## 2021-04-28 DIAGNOSIS — E872 Acidosis: Secondary | ICD-10-CM | POA: Diagnosis present

## 2021-04-28 DIAGNOSIS — I129 Hypertensive chronic kidney disease with stage 1 through stage 4 chronic kidney disease, or unspecified chronic kidney disease: Secondary | ICD-10-CM | POA: Diagnosis not present

## 2021-04-28 DIAGNOSIS — I080 Rheumatic disorders of both mitral and aortic valves: Secondary | ICD-10-CM | POA: Diagnosis not present

## 2021-04-28 DIAGNOSIS — R7989 Other specified abnormal findings of blood chemistry: Secondary | ICD-10-CM | POA: Diagnosis present

## 2021-04-28 DIAGNOSIS — R5383 Other fatigue: Secondary | ICD-10-CM | POA: Diagnosis not present

## 2021-05-01 DIAGNOSIS — N183 Chronic kidney disease, stage 3 unspecified: Secondary | ICD-10-CM | POA: Diagnosis not present

## 2021-05-01 DIAGNOSIS — E1122 Type 2 diabetes mellitus with diabetic chronic kidney disease: Secondary | ICD-10-CM | POA: Diagnosis not present

## 2021-05-01 DIAGNOSIS — I129 Hypertensive chronic kidney disease with stage 1 through stage 4 chronic kidney disease, or unspecified chronic kidney disease: Secondary | ICD-10-CM | POA: Diagnosis not present

## 2021-05-01 DIAGNOSIS — I482 Chronic atrial fibrillation, unspecified: Secondary | ICD-10-CM | POA: Diagnosis not present

## 2021-05-03 DIAGNOSIS — S41111A Laceration without foreign body of right upper arm, initial encounter: Secondary | ICD-10-CM | POA: Diagnosis not present

## 2021-05-04 DIAGNOSIS — I700 Atherosclerosis of aorta: Secondary | ICD-10-CM | POA: Diagnosis not present

## 2021-05-04 DIAGNOSIS — I129 Hypertensive chronic kidney disease with stage 1 through stage 4 chronic kidney disease, or unspecified chronic kidney disease: Secondary | ICD-10-CM | POA: Diagnosis not present

## 2021-05-04 DIAGNOSIS — I4891 Unspecified atrial fibrillation: Secondary | ICD-10-CM | POA: Diagnosis not present

## 2021-05-04 DIAGNOSIS — E1122 Type 2 diabetes mellitus with diabetic chronic kidney disease: Secondary | ICD-10-CM | POA: Diagnosis not present

## 2021-05-04 DIAGNOSIS — N189 Chronic kidney disease, unspecified: Secondary | ICD-10-CM | POA: Diagnosis not present

## 2021-05-04 DIAGNOSIS — E871 Hypo-osmolality and hyponatremia: Secondary | ICD-10-CM | POA: Diagnosis not present

## 2021-05-04 DIAGNOSIS — L03116 Cellulitis of left lower limb: Secondary | ICD-10-CM | POA: Diagnosis not present

## 2021-05-04 DIAGNOSIS — I4811 Longstanding persistent atrial fibrillation: Secondary | ICD-10-CM | POA: Diagnosis not present

## 2021-05-05 DIAGNOSIS — E1122 Type 2 diabetes mellitus with diabetic chronic kidney disease: Secondary | ICD-10-CM | POA: Diagnosis not present

## 2021-05-05 DIAGNOSIS — E871 Hypo-osmolality and hyponatremia: Secondary | ICD-10-CM | POA: Diagnosis not present

## 2021-05-05 DIAGNOSIS — I4891 Unspecified atrial fibrillation: Secondary | ICD-10-CM | POA: Diagnosis not present

## 2021-05-05 DIAGNOSIS — I129 Hypertensive chronic kidney disease with stage 1 through stage 4 chronic kidney disease, or unspecified chronic kidney disease: Secondary | ICD-10-CM | POA: Diagnosis not present

## 2021-05-05 DIAGNOSIS — I4811 Longstanding persistent atrial fibrillation: Secondary | ICD-10-CM | POA: Diagnosis not present

## 2021-05-05 DIAGNOSIS — Z6825 Body mass index (BMI) 25.0-25.9, adult: Secondary | ICD-10-CM | POA: Diagnosis not present

## 2021-05-05 DIAGNOSIS — L03116 Cellulitis of left lower limb: Secondary | ICD-10-CM | POA: Diagnosis not present

## 2021-05-05 DIAGNOSIS — I700 Atherosclerosis of aorta: Secondary | ICD-10-CM | POA: Diagnosis not present

## 2021-05-05 DIAGNOSIS — N189 Chronic kidney disease, unspecified: Secondary | ICD-10-CM | POA: Diagnosis not present

## 2021-05-05 DIAGNOSIS — D649 Anemia, unspecified: Secondary | ICD-10-CM | POA: Diagnosis not present

## 2021-05-08 DIAGNOSIS — I129 Hypertensive chronic kidney disease with stage 1 through stage 4 chronic kidney disease, or unspecified chronic kidney disease: Secondary | ICD-10-CM | POA: Diagnosis not present

## 2021-05-08 DIAGNOSIS — N189 Chronic kidney disease, unspecified: Secondary | ICD-10-CM | POA: Diagnosis not present

## 2021-05-08 DIAGNOSIS — E1122 Type 2 diabetes mellitus with diabetic chronic kidney disease: Secondary | ICD-10-CM | POA: Diagnosis not present

## 2021-05-08 DIAGNOSIS — L03116 Cellulitis of left lower limb: Secondary | ICD-10-CM | POA: Diagnosis not present

## 2021-05-08 DIAGNOSIS — E871 Hypo-osmolality and hyponatremia: Secondary | ICD-10-CM | POA: Diagnosis not present

## 2021-05-08 DIAGNOSIS — I4811 Longstanding persistent atrial fibrillation: Secondary | ICD-10-CM | POA: Diagnosis not present

## 2021-05-09 DIAGNOSIS — R339 Retention of urine, unspecified: Secondary | ICD-10-CM | POA: Diagnosis not present

## 2021-05-09 DIAGNOSIS — N401 Enlarged prostate with lower urinary tract symptoms: Secondary | ICD-10-CM | POA: Diagnosis not present

## 2021-05-10 DIAGNOSIS — N189 Chronic kidney disease, unspecified: Secondary | ICD-10-CM | POA: Diagnosis not present

## 2021-05-10 DIAGNOSIS — I4811 Longstanding persistent atrial fibrillation: Secondary | ICD-10-CM | POA: Diagnosis not present

## 2021-05-10 DIAGNOSIS — I129 Hypertensive chronic kidney disease with stage 1 through stage 4 chronic kidney disease, or unspecified chronic kidney disease: Secondary | ICD-10-CM | POA: Diagnosis not present

## 2021-05-10 DIAGNOSIS — L03116 Cellulitis of left lower limb: Secondary | ICD-10-CM | POA: Diagnosis not present

## 2021-05-10 DIAGNOSIS — E871 Hypo-osmolality and hyponatremia: Secondary | ICD-10-CM | POA: Diagnosis not present

## 2021-05-10 DIAGNOSIS — E1122 Type 2 diabetes mellitus with diabetic chronic kidney disease: Secondary | ICD-10-CM | POA: Diagnosis not present

## 2021-05-12 DIAGNOSIS — I129 Hypertensive chronic kidney disease with stage 1 through stage 4 chronic kidney disease, or unspecified chronic kidney disease: Secondary | ICD-10-CM | POA: Diagnosis not present

## 2021-05-12 DIAGNOSIS — E871 Hypo-osmolality and hyponatremia: Secondary | ICD-10-CM | POA: Diagnosis not present

## 2021-05-12 DIAGNOSIS — E1122 Type 2 diabetes mellitus with diabetic chronic kidney disease: Secondary | ICD-10-CM | POA: Diagnosis not present

## 2021-05-12 DIAGNOSIS — N189 Chronic kidney disease, unspecified: Secondary | ICD-10-CM | POA: Diagnosis not present

## 2021-05-12 DIAGNOSIS — I4811 Longstanding persistent atrial fibrillation: Secondary | ICD-10-CM | POA: Diagnosis not present

## 2021-05-12 DIAGNOSIS — L03116 Cellulitis of left lower limb: Secondary | ICD-10-CM | POA: Diagnosis not present

## 2021-05-15 DIAGNOSIS — N2581 Secondary hyperparathyroidism of renal origin: Secondary | ICD-10-CM | POA: Diagnosis not present

## 2021-05-15 DIAGNOSIS — N189 Chronic kidney disease, unspecified: Secondary | ICD-10-CM | POA: Diagnosis not present

## 2021-05-15 DIAGNOSIS — I129 Hypertensive chronic kidney disease with stage 1 through stage 4 chronic kidney disease, or unspecified chronic kidney disease: Secondary | ICD-10-CM | POA: Diagnosis not present

## 2021-05-15 DIAGNOSIS — E1122 Type 2 diabetes mellitus with diabetic chronic kidney disease: Secondary | ICD-10-CM | POA: Diagnosis not present

## 2021-05-15 DIAGNOSIS — E871 Hypo-osmolality and hyponatremia: Secondary | ICD-10-CM | POA: Diagnosis not present

## 2021-05-15 DIAGNOSIS — L03116 Cellulitis of left lower limb: Secondary | ICD-10-CM | POA: Diagnosis not present

## 2021-05-15 DIAGNOSIS — D631 Anemia in chronic kidney disease: Secondary | ICD-10-CM | POA: Diagnosis not present

## 2021-05-15 DIAGNOSIS — J449 Chronic obstructive pulmonary disease, unspecified: Secondary | ICD-10-CM | POA: Diagnosis not present

## 2021-05-15 DIAGNOSIS — I4811 Longstanding persistent atrial fibrillation: Secondary | ICD-10-CM | POA: Diagnosis not present

## 2021-05-17 DIAGNOSIS — N401 Enlarged prostate with lower urinary tract symptoms: Secondary | ICD-10-CM | POA: Diagnosis not present

## 2021-05-17 DIAGNOSIS — R339 Retention of urine, unspecified: Secondary | ICD-10-CM | POA: Diagnosis not present

## 2021-05-18 DIAGNOSIS — E1122 Type 2 diabetes mellitus with diabetic chronic kidney disease: Secondary | ICD-10-CM | POA: Diagnosis not present

## 2021-05-18 DIAGNOSIS — L03116 Cellulitis of left lower limb: Secondary | ICD-10-CM | POA: Diagnosis not present

## 2021-05-18 DIAGNOSIS — I4811 Longstanding persistent atrial fibrillation: Secondary | ICD-10-CM | POA: Diagnosis not present

## 2021-05-18 DIAGNOSIS — I129 Hypertensive chronic kidney disease with stage 1 through stage 4 chronic kidney disease, or unspecified chronic kidney disease: Secondary | ICD-10-CM | POA: Diagnosis not present

## 2021-05-18 DIAGNOSIS — E871 Hypo-osmolality and hyponatremia: Secondary | ICD-10-CM | POA: Diagnosis not present

## 2021-05-18 DIAGNOSIS — N189 Chronic kidney disease, unspecified: Secondary | ICD-10-CM | POA: Diagnosis not present

## 2021-05-24 DIAGNOSIS — E871 Hypo-osmolality and hyponatremia: Secondary | ICD-10-CM | POA: Diagnosis not present

## 2021-05-24 DIAGNOSIS — L03116 Cellulitis of left lower limb: Secondary | ICD-10-CM | POA: Diagnosis not present

## 2021-05-24 DIAGNOSIS — N189 Chronic kidney disease, unspecified: Secondary | ICD-10-CM | POA: Diagnosis not present

## 2021-05-24 DIAGNOSIS — E1122 Type 2 diabetes mellitus with diabetic chronic kidney disease: Secondary | ICD-10-CM | POA: Diagnosis not present

## 2021-05-24 DIAGNOSIS — I4811 Longstanding persistent atrial fibrillation: Secondary | ICD-10-CM | POA: Diagnosis not present

## 2021-05-24 DIAGNOSIS — I129 Hypertensive chronic kidney disease with stage 1 through stage 4 chronic kidney disease, or unspecified chronic kidney disease: Secondary | ICD-10-CM | POA: Diagnosis not present

## 2021-05-25 DIAGNOSIS — E871 Hypo-osmolality and hyponatremia: Secondary | ICD-10-CM | POA: Diagnosis not present

## 2021-05-25 DIAGNOSIS — Z6825 Body mass index (BMI) 25.0-25.9, adult: Secondary | ICD-10-CM | POA: Diagnosis not present

## 2021-05-25 DIAGNOSIS — I700 Atherosclerosis of aorta: Secondary | ICD-10-CM | POA: Diagnosis not present

## 2021-05-25 DIAGNOSIS — E1122 Type 2 diabetes mellitus with diabetic chronic kidney disease: Secondary | ICD-10-CM | POA: Diagnosis not present

## 2021-05-25 DIAGNOSIS — I4891 Unspecified atrial fibrillation: Secondary | ICD-10-CM | POA: Diagnosis not present

## 2021-05-29 DIAGNOSIS — E1140 Type 2 diabetes mellitus with diabetic neuropathy, unspecified: Secondary | ICD-10-CM | POA: Diagnosis not present

## 2021-05-29 DIAGNOSIS — E871 Hypo-osmolality and hyponatremia: Secondary | ICD-10-CM | POA: Diagnosis not present

## 2021-05-29 DIAGNOSIS — I129 Hypertensive chronic kidney disease with stage 1 through stage 4 chronic kidney disease, or unspecified chronic kidney disease: Secondary | ICD-10-CM | POA: Diagnosis not present

## 2021-05-29 DIAGNOSIS — E1151 Type 2 diabetes mellitus with diabetic peripheral angiopathy without gangrene: Secondary | ICD-10-CM | POA: Diagnosis not present

## 2021-05-29 DIAGNOSIS — E1122 Type 2 diabetes mellitus with diabetic chronic kidney disease: Secondary | ICD-10-CM | POA: Diagnosis not present

## 2021-05-29 DIAGNOSIS — N189 Chronic kidney disease, unspecified: Secondary | ICD-10-CM | POA: Diagnosis not present

## 2021-05-29 DIAGNOSIS — B351 Tinea unguium: Secondary | ICD-10-CM | POA: Diagnosis not present

## 2021-05-29 DIAGNOSIS — I4811 Longstanding persistent atrial fibrillation: Secondary | ICD-10-CM | POA: Diagnosis not present

## 2021-05-29 DIAGNOSIS — L03116 Cellulitis of left lower limb: Secondary | ICD-10-CM | POA: Diagnosis not present

## 2021-05-30 DIAGNOSIS — E871 Hypo-osmolality and hyponatremia: Secondary | ICD-10-CM | POA: Diagnosis not present

## 2021-05-30 DIAGNOSIS — D631 Anemia in chronic kidney disease: Secondary | ICD-10-CM | POA: Diagnosis not present

## 2021-05-30 DIAGNOSIS — Z940 Kidney transplant status: Secondary | ICD-10-CM | POA: Diagnosis not present

## 2021-05-30 DIAGNOSIS — E8770 Fluid overload, unspecified: Secondary | ICD-10-CM | POA: Diagnosis not present

## 2021-05-30 DIAGNOSIS — N189 Chronic kidney disease, unspecified: Secondary | ICD-10-CM | POA: Diagnosis not present

## 2021-05-30 DIAGNOSIS — Z79899 Other long term (current) drug therapy: Secondary | ICD-10-CM | POA: Diagnosis not present

## 2021-05-30 DIAGNOSIS — N179 Acute kidney failure, unspecified: Secondary | ICD-10-CM | POA: Diagnosis not present

## 2021-05-31 DIAGNOSIS — Z950 Presence of cardiac pacemaker: Secondary | ICD-10-CM | POA: Diagnosis not present

## 2021-05-31 DIAGNOSIS — I480 Paroxysmal atrial fibrillation: Secondary | ICD-10-CM | POA: Diagnosis present

## 2021-05-31 DIAGNOSIS — Z7901 Long term (current) use of anticoagulants: Secondary | ICD-10-CM | POA: Diagnosis not present

## 2021-05-31 DIAGNOSIS — Z940 Kidney transplant status: Secondary | ICD-10-CM | POA: Diagnosis not present

## 2021-05-31 DIAGNOSIS — D631 Anemia in chronic kidney disease: Secondary | ICD-10-CM | POA: Diagnosis not present

## 2021-05-31 DIAGNOSIS — R579 Shock, unspecified: Secondary | ICD-10-CM | POA: Diagnosis not present

## 2021-05-31 DIAGNOSIS — I482 Chronic atrial fibrillation, unspecified: Secondary | ICD-10-CM | POA: Diagnosis present

## 2021-05-31 DIAGNOSIS — R931 Abnormal findings on diagnostic imaging of heart and coronary circulation: Secondary | ICD-10-CM | POA: Diagnosis not present

## 2021-05-31 DIAGNOSIS — N185 Chronic kidney disease, stage 5: Secondary | ICD-10-CM | POA: Diagnosis not present

## 2021-05-31 DIAGNOSIS — J449 Chronic obstructive pulmonary disease, unspecified: Secondary | ICD-10-CM | POA: Diagnosis not present

## 2021-05-31 DIAGNOSIS — E1122 Type 2 diabetes mellitus with diabetic chronic kidney disease: Secondary | ICD-10-CM | POA: Diagnosis present

## 2021-05-31 DIAGNOSIS — D62 Acute posthemorrhagic anemia: Secondary | ICD-10-CM | POA: Diagnosis not present

## 2021-05-31 DIAGNOSIS — N390 Urinary tract infection, site not specified: Secondary | ICD-10-CM | POA: Diagnosis not present

## 2021-05-31 DIAGNOSIS — R1312 Dysphagia, oropharyngeal phase: Secondary | ICD-10-CM | POA: Diagnosis not present

## 2021-05-31 DIAGNOSIS — Z4659 Encounter for fitting and adjustment of other gastrointestinal appliance and device: Secondary | ICD-10-CM | POA: Diagnosis not present

## 2021-05-31 DIAGNOSIS — I5033 Acute on chronic diastolic (congestive) heart failure: Secondary | ICD-10-CM | POA: Diagnosis present

## 2021-05-31 DIAGNOSIS — J158 Pneumonia due to other specified bacteria: Secondary | ICD-10-CM | POA: Diagnosis not present

## 2021-05-31 DIAGNOSIS — R918 Other nonspecific abnormal finding of lung field: Secondary | ICD-10-CM | POA: Diagnosis not present

## 2021-05-31 DIAGNOSIS — J151 Pneumonia due to Pseudomonas: Secondary | ICD-10-CM | POA: Diagnosis not present

## 2021-05-31 DIAGNOSIS — I513 Intracardiac thrombosis, not elsewhere classified: Secondary | ICD-10-CM | POA: Diagnosis not present

## 2021-05-31 DIAGNOSIS — R600 Localized edema: Secondary | ICD-10-CM | POA: Diagnosis not present

## 2021-05-31 DIAGNOSIS — D84821 Immunodeficiency due to drugs: Secondary | ICD-10-CM | POA: Diagnosis not present

## 2021-05-31 DIAGNOSIS — E8770 Fluid overload, unspecified: Secondary | ICD-10-CM | POA: Diagnosis not present

## 2021-05-31 DIAGNOSIS — B965 Pseudomonas (aeruginosa) (mallei) (pseudomallei) as the cause of diseases classified elsewhere: Secondary | ICD-10-CM | POA: Diagnosis not present

## 2021-05-31 DIAGNOSIS — J984 Other disorders of lung: Secondary | ICD-10-CM | POA: Diagnosis not present

## 2021-05-31 DIAGNOSIS — E871 Hypo-osmolality and hyponatremia: Secondary | ICD-10-CM | POA: Diagnosis not present

## 2021-05-31 DIAGNOSIS — A4189 Other specified sepsis: Secondary | ICD-10-CM | POA: Diagnosis not present

## 2021-05-31 DIAGNOSIS — I493 Ventricular premature depolarization: Secondary | ICD-10-CM | POA: Diagnosis not present

## 2021-05-31 DIAGNOSIS — J9621 Acute and chronic respiratory failure with hypoxia: Secondary | ICD-10-CM | POA: Diagnosis present

## 2021-05-31 DIAGNOSIS — R0602 Shortness of breath: Secondary | ICD-10-CM | POA: Diagnosis not present

## 2021-05-31 DIAGNOSIS — R9389 Abnormal findings on diagnostic imaging of other specified body structures: Secondary | ICD-10-CM | POA: Diagnosis not present

## 2021-05-31 DIAGNOSIS — T8619 Other complication of kidney transplant: Secondary | ICD-10-CM | POA: Diagnosis not present

## 2021-05-31 DIAGNOSIS — E785 Hyperlipidemia, unspecified: Secondary | ICD-10-CM | POA: Diagnosis not present

## 2021-05-31 DIAGNOSIS — I5031 Acute diastolic (congestive) heart failure: Secondary | ICD-10-CM | POA: Diagnosis not present

## 2021-05-31 DIAGNOSIS — Z9981 Dependence on supplemental oxygen: Secondary | ICD-10-CM | POA: Diagnosis not present

## 2021-05-31 DIAGNOSIS — I130 Hypertensive heart and chronic kidney disease with heart failure and stage 1 through stage 4 chronic kidney disease, or unspecified chronic kidney disease: Secondary | ICD-10-CM | POA: Diagnosis not present

## 2021-05-31 DIAGNOSIS — N184 Chronic kidney disease, stage 4 (severe): Secondary | ICD-10-CM | POA: Diagnosis present

## 2021-05-31 DIAGNOSIS — N186 End stage renal disease: Secondary | ICD-10-CM | POA: Diagnosis not present

## 2021-05-31 DIAGNOSIS — J189 Pneumonia, unspecified organism: Secondary | ICD-10-CM | POA: Diagnosis not present

## 2021-05-31 DIAGNOSIS — N183 Chronic kidney disease, stage 3 unspecified: Secondary | ICD-10-CM | POA: Diagnosis not present

## 2021-05-31 DIAGNOSIS — A419 Sepsis, unspecified organism: Secondary | ICD-10-CM | POA: Diagnosis not present

## 2021-05-31 DIAGNOSIS — N1832 Chronic kidney disease, stage 3b: Secondary | ICD-10-CM | POA: Diagnosis not present

## 2021-05-31 DIAGNOSIS — Z79899 Other long term (current) drug therapy: Secondary | ICD-10-CM | POA: Diagnosis not present

## 2021-05-31 DIAGNOSIS — R578 Other shock: Secondary | ICD-10-CM | POA: Diagnosis not present

## 2021-05-31 DIAGNOSIS — J9601 Acute respiratory failure with hypoxia: Secondary | ICD-10-CM | POA: Diagnosis not present

## 2021-05-31 DIAGNOSIS — E1140 Type 2 diabetes mellitus with diabetic neuropathy, unspecified: Secondary | ICD-10-CM | POA: Diagnosis not present

## 2021-05-31 DIAGNOSIS — A4152 Sepsis due to Pseudomonas: Secondary | ICD-10-CM | POA: Diagnosis not present

## 2021-05-31 DIAGNOSIS — Z794 Long term (current) use of insulin: Secondary | ICD-10-CM | POA: Diagnosis not present

## 2021-05-31 DIAGNOSIS — I340 Nonrheumatic mitral (valve) insufficiency: Secondary | ICD-10-CM | POA: Diagnosis not present

## 2021-05-31 DIAGNOSIS — I132 Hypertensive heart and chronic kidney disease with heart failure and with stage 5 chronic kidney disease, or end stage renal disease: Secondary | ICD-10-CM | POA: Diagnosis present

## 2021-05-31 DIAGNOSIS — I5020 Unspecified systolic (congestive) heart failure: Secondary | ICD-10-CM | POA: Diagnosis not present

## 2021-05-31 DIAGNOSIS — J90 Pleural effusion, not elsewhere classified: Secondary | ICD-10-CM | POA: Diagnosis not present

## 2021-05-31 DIAGNOSIS — E46 Unspecified protein-calorie malnutrition: Secondary | ICD-10-CM | POA: Diagnosis present

## 2021-05-31 DIAGNOSIS — Q2549 Other congenital malformations of aorta: Secondary | ICD-10-CM | POA: Diagnosis not present

## 2021-05-31 DIAGNOSIS — N189 Chronic kidney disease, unspecified: Secondary | ICD-10-CM | POA: Diagnosis not present

## 2021-05-31 DIAGNOSIS — Z87891 Personal history of nicotine dependence: Secondary | ICD-10-CM | POA: Diagnosis not present

## 2021-05-31 DIAGNOSIS — I352 Nonrheumatic aortic (valve) stenosis with insufficiency: Secondary | ICD-10-CM | POA: Diagnosis not present

## 2021-05-31 DIAGNOSIS — E876 Hypokalemia: Secondary | ICD-10-CM | POA: Diagnosis not present

## 2021-05-31 DIAGNOSIS — I5030 Unspecified diastolic (congestive) heart failure: Secondary | ICD-10-CM | POA: Diagnosis not present

## 2021-05-31 DIAGNOSIS — N179 Acute kidney failure, unspecified: Secondary | ICD-10-CM | POA: Diagnosis not present

## 2021-05-31 DIAGNOSIS — B961 Klebsiella pneumoniae [K. pneumoniae] as the cause of diseases classified elsewhere: Secondary | ICD-10-CM | POA: Diagnosis not present

## 2021-05-31 DIAGNOSIS — Z20822 Contact with and (suspected) exposure to covid-19: Secondary | ICD-10-CM | POA: Diagnosis not present

## 2021-05-31 DIAGNOSIS — Z8673 Personal history of transient ischemic attack (TIA), and cerebral infarction without residual deficits: Secondary | ICD-10-CM | POA: Diagnosis not present

## 2021-05-31 DIAGNOSIS — I509 Heart failure, unspecified: Secondary | ICD-10-CM | POA: Diagnosis not present

## 2021-05-31 DIAGNOSIS — J150 Pneumonia due to Klebsiella pneumoniae: Secondary | ICD-10-CM | POA: Diagnosis not present

## 2021-05-31 DIAGNOSIS — R339 Retention of urine, unspecified: Secondary | ICD-10-CM | POA: Diagnosis not present

## 2021-05-31 DIAGNOSIS — R7989 Other specified abnormal findings of blood chemistry: Secondary | ICD-10-CM | POA: Diagnosis not present

## 2021-05-31 DIAGNOSIS — K922 Gastrointestinal hemorrhage, unspecified: Secondary | ICD-10-CM | POA: Diagnosis not present

## 2021-05-31 DIAGNOSIS — I5023 Acute on chronic systolic (congestive) heart failure: Secondary | ICD-10-CM | POA: Diagnosis not present

## 2021-05-31 DIAGNOSIS — B998 Other infectious disease: Secondary | ICD-10-CM | POA: Diagnosis not present

## 2021-05-31 DIAGNOSIS — R6521 Severe sepsis with septic shock: Secondary | ICD-10-CM | POA: Diagnosis not present

## 2021-05-31 DIAGNOSIS — R42 Dizziness and giddiness: Secondary | ICD-10-CM | POA: Diagnosis not present

## 2021-05-31 DIAGNOSIS — Z992 Dependence on renal dialysis: Secondary | ICD-10-CM | POA: Diagnosis not present

## 2021-05-31 DIAGNOSIS — R571 Hypovolemic shock: Secondary | ICD-10-CM | POA: Diagnosis present

## 2021-05-31 DIAGNOSIS — I4891 Unspecified atrial fibrillation: Secondary | ICD-10-CM | POA: Diagnosis not present

## 2021-05-31 DIAGNOSIS — R633 Feeding difficulties, unspecified: Secondary | ICD-10-CM | POA: Diagnosis not present

## 2021-05-31 DIAGNOSIS — K264 Chronic or unspecified duodenal ulcer with hemorrhage: Secondary | ICD-10-CM | POA: Diagnosis not present

## 2021-05-31 DIAGNOSIS — D500 Iron deficiency anemia secondary to blood loss (chronic): Secondary | ICD-10-CM | POA: Diagnosis not present

## 2021-05-31 DIAGNOSIS — I5189 Other ill-defined heart diseases: Secondary | ICD-10-CM | POA: Diagnosis not present

## 2021-06-01 DIAGNOSIS — J9621 Acute and chronic respiratory failure with hypoxia: Secondary | ICD-10-CM | POA: Diagnosis not present

## 2021-06-01 DIAGNOSIS — I130 Hypertensive heart and chronic kidney disease with heart failure and stage 1 through stage 4 chronic kidney disease, or unspecified chronic kidney disease: Secondary | ICD-10-CM | POA: Diagnosis not present

## 2021-06-01 DIAGNOSIS — E1122 Type 2 diabetes mellitus with diabetic chronic kidney disease: Secondary | ICD-10-CM | POA: Diagnosis not present

## 2021-06-01 DIAGNOSIS — T8619 Other complication of kidney transplant: Secondary | ICD-10-CM | POA: Diagnosis not present

## 2021-06-01 DIAGNOSIS — Z794 Long term (current) use of insulin: Secondary | ICD-10-CM | POA: Diagnosis not present

## 2021-06-01 DIAGNOSIS — I4891 Unspecified atrial fibrillation: Secondary | ICD-10-CM | POA: Diagnosis not present

## 2021-06-01 DIAGNOSIS — E8770 Fluid overload, unspecified: Secondary | ICD-10-CM | POA: Diagnosis not present

## 2021-06-01 DIAGNOSIS — N179 Acute kidney failure, unspecified: Secondary | ICD-10-CM | POA: Diagnosis not present

## 2021-06-01 DIAGNOSIS — Z940 Kidney transplant status: Secondary | ICD-10-CM | POA: Diagnosis not present

## 2021-06-01 DIAGNOSIS — I5033 Acute on chronic diastolic (congestive) heart failure: Secondary | ICD-10-CM | POA: Diagnosis not present

## 2021-06-01 DIAGNOSIS — N184 Chronic kidney disease, stage 4 (severe): Secondary | ICD-10-CM | POA: Diagnosis not present

## 2021-06-02 DIAGNOSIS — E1122 Type 2 diabetes mellitus with diabetic chronic kidney disease: Secondary | ICD-10-CM | POA: Diagnosis not present

## 2021-06-02 DIAGNOSIS — I482 Chronic atrial fibrillation, unspecified: Secondary | ICD-10-CM | POA: Diagnosis not present

## 2021-06-02 DIAGNOSIS — I130 Hypertensive heart and chronic kidney disease with heart failure and stage 1 through stage 4 chronic kidney disease, or unspecified chronic kidney disease: Secondary | ICD-10-CM | POA: Diagnosis not present

## 2021-06-02 DIAGNOSIS — I5033 Acute on chronic diastolic (congestive) heart failure: Secondary | ICD-10-CM | POA: Diagnosis not present

## 2021-06-02 DIAGNOSIS — J9621 Acute and chronic respiratory failure with hypoxia: Secondary | ICD-10-CM | POA: Diagnosis not present

## 2021-06-02 DIAGNOSIS — Z940 Kidney transplant status: Secondary | ICD-10-CM | POA: Diagnosis not present

## 2021-06-02 DIAGNOSIS — Z79899 Other long term (current) drug therapy: Secondary | ICD-10-CM | POA: Diagnosis not present

## 2021-06-02 DIAGNOSIS — N184 Chronic kidney disease, stage 4 (severe): Secondary | ICD-10-CM | POA: Diagnosis not present

## 2021-06-02 DIAGNOSIS — D84821 Immunodeficiency due to drugs: Secondary | ICD-10-CM | POA: Diagnosis not present

## 2021-06-02 DIAGNOSIS — N1832 Chronic kidney disease, stage 3b: Secondary | ICD-10-CM | POA: Diagnosis not present

## 2021-06-02 DIAGNOSIS — N179 Acute kidney failure, unspecified: Secondary | ICD-10-CM | POA: Diagnosis not present

## 2021-06-02 DIAGNOSIS — T8619 Other complication of kidney transplant: Secondary | ICD-10-CM | POA: Diagnosis not present

## 2021-06-03 DIAGNOSIS — R0602 Shortness of breath: Secondary | ICD-10-CM | POA: Diagnosis not present

## 2021-06-03 DIAGNOSIS — I130 Hypertensive heart and chronic kidney disease with heart failure and stage 1 through stage 4 chronic kidney disease, or unspecified chronic kidney disease: Secondary | ICD-10-CM | POA: Diagnosis not present

## 2021-06-03 DIAGNOSIS — J9621 Acute and chronic respiratory failure with hypoxia: Secondary | ICD-10-CM | POA: Diagnosis not present

## 2021-06-03 DIAGNOSIS — E1122 Type 2 diabetes mellitus with diabetic chronic kidney disease: Secondary | ICD-10-CM | POA: Diagnosis not present

## 2021-06-03 DIAGNOSIS — N184 Chronic kidney disease, stage 4 (severe): Secondary | ICD-10-CM | POA: Diagnosis not present

## 2021-06-03 DIAGNOSIS — T8619 Other complication of kidney transplant: Secondary | ICD-10-CM | POA: Diagnosis not present

## 2021-06-03 DIAGNOSIS — N179 Acute kidney failure, unspecified: Secondary | ICD-10-CM | POA: Diagnosis not present

## 2021-06-03 DIAGNOSIS — D84821 Immunodeficiency due to drugs: Secondary | ICD-10-CM | POA: Diagnosis not present

## 2021-06-03 DIAGNOSIS — I482 Chronic atrial fibrillation, unspecified: Secondary | ICD-10-CM | POA: Diagnosis not present

## 2021-06-03 DIAGNOSIS — I5033 Acute on chronic diastolic (congestive) heart failure: Secondary | ICD-10-CM | POA: Diagnosis not present

## 2021-06-03 DIAGNOSIS — E8770 Fluid overload, unspecified: Secondary | ICD-10-CM | POA: Diagnosis not present

## 2021-06-03 DIAGNOSIS — Z79899 Other long term (current) drug therapy: Secondary | ICD-10-CM | POA: Diagnosis not present

## 2021-06-04 DIAGNOSIS — N179 Acute kidney failure, unspecified: Secondary | ICD-10-CM | POA: Diagnosis not present

## 2021-06-09 DIAGNOSIS — N179 Acute kidney failure, unspecified: Secondary | ICD-10-CM | POA: Diagnosis not present

## 2021-06-10 DIAGNOSIS — Z9981 Dependence on supplemental oxygen: Secondary | ICD-10-CM | POA: Diagnosis not present

## 2021-06-10 DIAGNOSIS — N179 Acute kidney failure, unspecified: Secondary | ICD-10-CM | POA: Diagnosis not present

## 2021-06-10 DIAGNOSIS — N189 Chronic kidney disease, unspecified: Secondary | ICD-10-CM | POA: Diagnosis not present

## 2021-06-10 DIAGNOSIS — E1122 Type 2 diabetes mellitus with diabetic chronic kidney disease: Secondary | ICD-10-CM | POA: Diagnosis not present

## 2021-06-10 DIAGNOSIS — I482 Chronic atrial fibrillation, unspecified: Secondary | ICD-10-CM | POA: Diagnosis not present

## 2021-06-10 DIAGNOSIS — Z4659 Encounter for fitting and adjustment of other gastrointestinal appliance and device: Secondary | ICD-10-CM | POA: Diagnosis not present

## 2021-06-10 DIAGNOSIS — I5031 Acute diastolic (congestive) heart failure: Secondary | ICD-10-CM | POA: Diagnosis not present

## 2021-06-10 DIAGNOSIS — I5033 Acute on chronic diastolic (congestive) heart failure: Secondary | ICD-10-CM | POA: Diagnosis not present

## 2021-06-10 DIAGNOSIS — Z79899 Other long term (current) drug therapy: Secondary | ICD-10-CM | POA: Diagnosis not present

## 2021-06-10 DIAGNOSIS — I130 Hypertensive heart and chronic kidney disease with heart failure and stage 1 through stage 4 chronic kidney disease, or unspecified chronic kidney disease: Secondary | ICD-10-CM | POA: Diagnosis not present

## 2021-06-10 DIAGNOSIS — I4891 Unspecified atrial fibrillation: Secondary | ICD-10-CM | POA: Diagnosis not present

## 2021-06-10 DIAGNOSIS — R578 Other shock: Secondary | ICD-10-CM | POA: Diagnosis not present

## 2021-06-10 DIAGNOSIS — D62 Acute posthemorrhagic anemia: Secondary | ICD-10-CM | POA: Diagnosis not present

## 2021-06-10 DIAGNOSIS — D631 Anemia in chronic kidney disease: Secondary | ICD-10-CM | POA: Diagnosis not present

## 2021-06-10 DIAGNOSIS — N186 End stage renal disease: Secondary | ICD-10-CM | POA: Diagnosis not present

## 2021-06-10 DIAGNOSIS — E46 Unspecified protein-calorie malnutrition: Secondary | ICD-10-CM | POA: Diagnosis not present

## 2021-06-10 DIAGNOSIS — N184 Chronic kidney disease, stage 4 (severe): Secondary | ICD-10-CM | POA: Diagnosis not present

## 2021-06-10 DIAGNOSIS — I5020 Unspecified systolic (congestive) heart failure: Secondary | ICD-10-CM | POA: Diagnosis not present

## 2021-06-10 DIAGNOSIS — K264 Chronic or unspecified duodenal ulcer with hemorrhage: Secondary | ICD-10-CM | POA: Diagnosis not present

## 2021-06-10 DIAGNOSIS — J9621 Acute and chronic respiratory failure with hypoxia: Secondary | ICD-10-CM | POA: Diagnosis not present

## 2021-06-10 DIAGNOSIS — A419 Sepsis, unspecified organism: Secondary | ICD-10-CM | POA: Diagnosis not present

## 2021-06-10 DIAGNOSIS — R6521 Severe sepsis with septic shock: Secondary | ICD-10-CM | POA: Diagnosis not present

## 2021-06-10 DIAGNOSIS — K922 Gastrointestinal hemorrhage, unspecified: Secondary | ICD-10-CM | POA: Diagnosis not present

## 2021-06-10 DIAGNOSIS — Z940 Kidney transplant status: Secondary | ICD-10-CM | POA: Diagnosis not present

## 2021-06-11 DIAGNOSIS — N186 End stage renal disease: Secondary | ICD-10-CM | POA: Diagnosis not present

## 2021-06-11 DIAGNOSIS — R578 Other shock: Secondary | ICD-10-CM | POA: Diagnosis not present

## 2021-06-11 DIAGNOSIS — I482 Chronic atrial fibrillation, unspecified: Secondary | ICD-10-CM | POA: Diagnosis not present

## 2021-06-11 DIAGNOSIS — Z9981 Dependence on supplemental oxygen: Secondary | ICD-10-CM | POA: Diagnosis not present

## 2021-06-11 DIAGNOSIS — E1122 Type 2 diabetes mellitus with diabetic chronic kidney disease: Secondary | ICD-10-CM | POA: Diagnosis not present

## 2021-06-11 DIAGNOSIS — N179 Acute kidney failure, unspecified: Secondary | ICD-10-CM | POA: Diagnosis not present

## 2021-06-11 DIAGNOSIS — Z940 Kidney transplant status: Secondary | ICD-10-CM | POA: Diagnosis not present

## 2021-06-11 DIAGNOSIS — J9621 Acute and chronic respiratory failure with hypoxia: Secondary | ICD-10-CM | POA: Diagnosis not present

## 2021-06-11 DIAGNOSIS — I5031 Acute diastolic (congestive) heart failure: Secondary | ICD-10-CM | POA: Diagnosis not present

## 2021-06-11 DIAGNOSIS — K922 Gastrointestinal hemorrhage, unspecified: Secondary | ICD-10-CM | POA: Diagnosis not present

## 2021-06-12 DIAGNOSIS — Z950 Presence of cardiac pacemaker: Secondary | ICD-10-CM | POA: Diagnosis not present

## 2021-06-12 DIAGNOSIS — R571 Hypovolemic shock: Secondary | ICD-10-CM | POA: Diagnosis not present

## 2021-06-12 DIAGNOSIS — J158 Pneumonia due to other specified bacteria: Secondary | ICD-10-CM | POA: Diagnosis not present

## 2021-06-12 DIAGNOSIS — E1122 Type 2 diabetes mellitus with diabetic chronic kidney disease: Secondary | ICD-10-CM | POA: Diagnosis not present

## 2021-06-12 DIAGNOSIS — J9601 Acute respiratory failure with hypoxia: Secondary | ICD-10-CM | POA: Diagnosis not present

## 2021-06-12 DIAGNOSIS — K922 Gastrointestinal hemorrhage, unspecified: Secondary | ICD-10-CM | POA: Diagnosis not present

## 2021-06-12 DIAGNOSIS — B961 Klebsiella pneumoniae [K. pneumoniae] as the cause of diseases classified elsewhere: Secondary | ICD-10-CM | POA: Diagnosis not present

## 2021-06-12 DIAGNOSIS — I4891 Unspecified atrial fibrillation: Secondary | ICD-10-CM | POA: Diagnosis not present

## 2021-06-12 DIAGNOSIS — B965 Pseudomonas (aeruginosa) (mallei) (pseudomallei) as the cause of diseases classified elsewhere: Secondary | ICD-10-CM | POA: Diagnosis not present

## 2021-06-12 DIAGNOSIS — R579 Shock, unspecified: Secondary | ICD-10-CM | POA: Diagnosis not present

## 2021-06-12 DIAGNOSIS — I132 Hypertensive heart and chronic kidney disease with heart failure and with stage 5 chronic kidney disease, or end stage renal disease: Secondary | ICD-10-CM | POA: Diagnosis not present

## 2021-06-12 DIAGNOSIS — R6521 Severe sepsis with septic shock: Secondary | ICD-10-CM | POA: Diagnosis not present

## 2021-06-12 DIAGNOSIS — N179 Acute kidney failure, unspecified: Secondary | ICD-10-CM | POA: Diagnosis not present

## 2021-06-12 DIAGNOSIS — A419 Sepsis, unspecified organism: Secondary | ICD-10-CM | POA: Diagnosis not present

## 2021-06-12 DIAGNOSIS — D62 Acute posthemorrhagic anemia: Secondary | ICD-10-CM | POA: Diagnosis not present

## 2021-06-23 DIAGNOSIS — N179 Acute kidney failure, unspecified: Secondary | ICD-10-CM | POA: Diagnosis not present

## 2021-06-23 DIAGNOSIS — D649 Anemia, unspecified: Secondary | ICD-10-CM | POA: Diagnosis not present

## 2021-06-23 DIAGNOSIS — D509 Iron deficiency anemia, unspecified: Secondary | ICD-10-CM | POA: Diagnosis not present

## 2021-06-23 DIAGNOSIS — I4891 Unspecified atrial fibrillation: Secondary | ICD-10-CM | POA: Diagnosis not present

## 2021-06-23 DIAGNOSIS — N189 Chronic kidney disease, unspecified: Secondary | ICD-10-CM | POA: Diagnosis not present

## 2021-06-26 DIAGNOSIS — N189 Chronic kidney disease, unspecified: Secondary | ICD-10-CM | POA: Diagnosis not present

## 2021-06-26 DIAGNOSIS — N179 Acute kidney failure, unspecified: Secondary | ICD-10-CM | POA: Diagnosis not present

## 2021-06-26 DIAGNOSIS — D649 Anemia, unspecified: Secondary | ICD-10-CM | POA: Diagnosis not present

## 2021-06-26 DIAGNOSIS — D509 Iron deficiency anemia, unspecified: Secondary | ICD-10-CM | POA: Diagnosis not present

## 2021-06-27 DIAGNOSIS — N401 Enlarged prostate with lower urinary tract symptoms: Secondary | ICD-10-CM | POA: Diagnosis not present

## 2021-06-27 DIAGNOSIS — R338 Other retention of urine: Secondary | ICD-10-CM | POA: Diagnosis not present

## 2021-06-27 DIAGNOSIS — R339 Retention of urine, unspecified: Secondary | ICD-10-CM | POA: Diagnosis not present

## 2021-06-28 DIAGNOSIS — N179 Acute kidney failure, unspecified: Secondary | ICD-10-CM | POA: Diagnosis not present

## 2021-06-28 DIAGNOSIS — N189 Chronic kidney disease, unspecified: Secondary | ICD-10-CM | POA: Diagnosis not present

## 2021-06-28 DIAGNOSIS — D649 Anemia, unspecified: Secondary | ICD-10-CM | POA: Diagnosis not present

## 2021-06-28 DIAGNOSIS — D509 Iron deficiency anemia, unspecified: Secondary | ICD-10-CM | POA: Diagnosis not present

## 2021-06-29 DIAGNOSIS — I4891 Unspecified atrial fibrillation: Secondary | ICD-10-CM | POA: Diagnosis not present

## 2021-06-30 DIAGNOSIS — D649 Anemia, unspecified: Secondary | ICD-10-CM | POA: Diagnosis not present

## 2021-06-30 DIAGNOSIS — D509 Iron deficiency anemia, unspecified: Secondary | ICD-10-CM | POA: Diagnosis not present

## 2021-06-30 DIAGNOSIS — N189 Chronic kidney disease, unspecified: Secondary | ICD-10-CM | POA: Diagnosis not present

## 2021-06-30 DIAGNOSIS — N179 Acute kidney failure, unspecified: Secondary | ICD-10-CM | POA: Diagnosis not present

## 2021-07-02 DIAGNOSIS — N183 Chronic kidney disease, stage 3 unspecified: Secondary | ICD-10-CM | POA: Diagnosis not present

## 2021-07-02 DIAGNOSIS — E1122 Type 2 diabetes mellitus with diabetic chronic kidney disease: Secondary | ICD-10-CM | POA: Diagnosis not present

## 2021-07-02 DIAGNOSIS — I482 Chronic atrial fibrillation, unspecified: Secondary | ICD-10-CM | POA: Diagnosis not present

## 2021-07-02 DIAGNOSIS — I129 Hypertensive chronic kidney disease with stage 1 through stage 4 chronic kidney disease, or unspecified chronic kidney disease: Secondary | ICD-10-CM | POA: Diagnosis not present

## 2021-07-03 DIAGNOSIS — N189 Chronic kidney disease, unspecified: Secondary | ICD-10-CM | POA: Diagnosis not present

## 2021-07-03 DIAGNOSIS — Z23 Encounter for immunization: Secondary | ICD-10-CM | POA: Diagnosis not present

## 2021-07-03 DIAGNOSIS — E559 Vitamin D deficiency, unspecified: Secondary | ICD-10-CM | POA: Diagnosis not present

## 2021-07-03 DIAGNOSIS — N179 Acute kidney failure, unspecified: Secondary | ICD-10-CM | POA: Diagnosis not present

## 2021-07-03 DIAGNOSIS — D509 Iron deficiency anemia, unspecified: Secondary | ICD-10-CM | POA: Diagnosis not present

## 2021-07-03 DIAGNOSIS — D649 Anemia, unspecified: Secondary | ICD-10-CM | POA: Diagnosis not present

## 2021-07-05 DIAGNOSIS — I10 Essential (primary) hypertension: Secondary | ICD-10-CM | POA: Diagnosis not present

## 2021-07-05 DIAGNOSIS — E559 Vitamin D deficiency, unspecified: Secondary | ICD-10-CM | POA: Diagnosis not present

## 2021-07-05 DIAGNOSIS — N179 Acute kidney failure, unspecified: Secondary | ICD-10-CM | POA: Diagnosis not present

## 2021-07-05 DIAGNOSIS — Z940 Kidney transplant status: Secondary | ICD-10-CM | POA: Diagnosis not present

## 2021-07-05 DIAGNOSIS — I4891 Unspecified atrial fibrillation: Secondary | ICD-10-CM | POA: Diagnosis not present

## 2021-07-05 DIAGNOSIS — Z992 Dependence on renal dialysis: Secondary | ICD-10-CM | POA: Diagnosis not present

## 2021-07-05 DIAGNOSIS — D509 Iron deficiency anemia, unspecified: Secondary | ICD-10-CM | POA: Diagnosis not present

## 2021-07-05 DIAGNOSIS — J9610 Chronic respiratory failure, unspecified whether with hypoxia or hypercapnia: Secondary | ICD-10-CM | POA: Diagnosis not present

## 2021-07-05 DIAGNOSIS — E1142 Type 2 diabetes mellitus with diabetic polyneuropathy: Secondary | ICD-10-CM | POA: Diagnosis not present

## 2021-07-05 DIAGNOSIS — N189 Chronic kidney disease, unspecified: Secondary | ICD-10-CM | POA: Diagnosis not present

## 2021-07-05 DIAGNOSIS — E1122 Type 2 diabetes mellitus with diabetic chronic kidney disease: Secondary | ICD-10-CM | POA: Diagnosis not present

## 2021-07-05 DIAGNOSIS — Z23 Encounter for immunization: Secondary | ICD-10-CM | POA: Diagnosis not present

## 2021-07-05 DIAGNOSIS — D649 Anemia, unspecified: Secondary | ICD-10-CM | POA: Diagnosis not present

## 2021-07-06 DIAGNOSIS — I509 Heart failure, unspecified: Secondary | ICD-10-CM | POA: Diagnosis not present

## 2021-07-06 DIAGNOSIS — E1122 Type 2 diabetes mellitus with diabetic chronic kidney disease: Secondary | ICD-10-CM | POA: Diagnosis not present

## 2021-07-06 DIAGNOSIS — Z0001 Encounter for general adult medical examination with abnormal findings: Secondary | ICD-10-CM | POA: Diagnosis not present

## 2021-07-06 DIAGNOSIS — I482 Chronic atrial fibrillation, unspecified: Secondary | ICD-10-CM | POA: Diagnosis not present

## 2021-07-06 DIAGNOSIS — Z940 Kidney transplant status: Secondary | ICD-10-CM | POA: Diagnosis not present

## 2021-07-06 DIAGNOSIS — E7849 Other hyperlipidemia: Secondary | ICD-10-CM | POA: Diagnosis not present

## 2021-07-06 DIAGNOSIS — I10 Essential (primary) hypertension: Secondary | ICD-10-CM | POA: Diagnosis not present

## 2021-07-06 DIAGNOSIS — I4891 Unspecified atrial fibrillation: Secondary | ICD-10-CM | POA: Diagnosis not present

## 2021-07-07 DIAGNOSIS — N179 Acute kidney failure, unspecified: Secondary | ICD-10-CM | POA: Diagnosis not present

## 2021-07-07 DIAGNOSIS — E559 Vitamin D deficiency, unspecified: Secondary | ICD-10-CM | POA: Diagnosis not present

## 2021-07-07 DIAGNOSIS — Z23 Encounter for immunization: Secondary | ICD-10-CM | POA: Diagnosis not present

## 2021-07-07 DIAGNOSIS — D649 Anemia, unspecified: Secondary | ICD-10-CM | POA: Diagnosis not present

## 2021-07-07 DIAGNOSIS — N189 Chronic kidney disease, unspecified: Secondary | ICD-10-CM | POA: Diagnosis not present

## 2021-07-07 DIAGNOSIS — D509 Iron deficiency anemia, unspecified: Secondary | ICD-10-CM | POA: Diagnosis not present

## 2021-07-08 DIAGNOSIS — I4891 Unspecified atrial fibrillation: Secondary | ICD-10-CM | POA: Diagnosis not present

## 2021-07-10 ENCOUNTER — Encounter: Payer: Self-pay | Admitting: Urology

## 2021-07-10 ENCOUNTER — Ambulatory Visit (INDEPENDENT_AMBULATORY_CARE_PROVIDER_SITE_OTHER): Payer: Medicare Other | Admitting: Urology

## 2021-07-10 ENCOUNTER — Other Ambulatory Visit: Payer: Self-pay | Admitting: Urology

## 2021-07-10 ENCOUNTER — Other Ambulatory Visit: Payer: Self-pay

## 2021-07-10 VITALS — BP 109/64 | HR 83 | Ht 70.0 in | Wt 170.0 lb

## 2021-07-10 DIAGNOSIS — E559 Vitamin D deficiency, unspecified: Secondary | ICD-10-CM | POA: Diagnosis not present

## 2021-07-10 DIAGNOSIS — R339 Retention of urine, unspecified: Secondary | ICD-10-CM | POA: Diagnosis not present

## 2021-07-10 DIAGNOSIS — D509 Iron deficiency anemia, unspecified: Secondary | ICD-10-CM | POA: Diagnosis not present

## 2021-07-10 DIAGNOSIS — N179 Acute kidney failure, unspecified: Secondary | ICD-10-CM | POA: Diagnosis not present

## 2021-07-10 DIAGNOSIS — N139 Obstructive and reflux uropathy, unspecified: Secondary | ICD-10-CM

## 2021-07-10 DIAGNOSIS — N189 Chronic kidney disease, unspecified: Secondary | ICD-10-CM | POA: Diagnosis not present

## 2021-07-10 DIAGNOSIS — Z23 Encounter for immunization: Secondary | ICD-10-CM | POA: Diagnosis not present

## 2021-07-10 DIAGNOSIS — D649 Anemia, unspecified: Secondary | ICD-10-CM | POA: Diagnosis not present

## 2021-07-10 MED ORDER — SILODOSIN 8 MG PO CAPS: 8.0000 mg | ORAL_CAPSULE | Freq: Every day | ORAL | 11 refills | Status: DC

## 2021-07-10 NOTE — Progress Notes (Signed)
Urological Symptom Review  Patient is experiencing the following symptoms: Blood in urine Injury to kidneys/bladder Erection problems (male only)   Review of Systems  Gastrointestinal (upper)  : Negative for upper GI symptoms  Gastrointestinal (lower) : Negative for lower GI symptoms  Constitutional : Negative for symptoms  Skin: Negative for skin symptoms  Eyes: Negative for eye symptoms  Ear/Nose/Throat : Negative for Ear/Nose/Throat symptoms  Hematologic/Lymphatic: Negative for Hematologic/Lymphatic symptoms  Cardiovascular : Leg swelling  Respiratory : Shortness of breath  Endocrine: Negative for endocrine symptoms  Musculoskeletal: Joint pain  Neurological: Dizziness  Psychologic: Negative for psychiatric symptoms

## 2021-07-10 NOTE — Progress Notes (Signed)
07/10/2021 11:12 AM   Kevin Frey 1941/06/13 [id number]  Referring provider: Caryl Bis, MD [place],  [place] [address]  Urinary retention   HPI: Kevin Frey is a 80yo here for evaluation of BPH and urinary retention. He has a hx of ESRD s/p DDKT in 2015. His transplant failed and he was started on dialysis 3 weeks ago. He was previously seen by Dr. Lerry Liner for BPH and he was on flomax 0.4mg  daily. Flomax was then increased to BID 2-3 months ago. He had a foley initially placed 05/31/2021 for urinary retention and has failed voiding trials since then and had the foley replaced.    PMH: Past Medical History:  Diagnosis Date   Acute ischemic vertebrobasilar artery thalamic stroke ([place]) 2015   Atrial fibrillation (HCC)    On Coumadin   Basal cell carcinoma 01/08/2017   right forearm ( txpbx)   COPD (chronic obstructive pulmonary disease) (HCC)    Deceased-donor kidney transplant 2015   Diabetic nephropathy (HCC)    End stage renal disease (HCC)    FSGS (focal segmental glomerulosclerosis)    HLD (hyperlipidemia)    HTN (hypertension)    Long-term use of immunosuppressant medication    SCC (squamous cell carcinoma) 05/10/2015   right side of face ([id number])   SCC (squamous cell carcinoma) 04/25/2016   right post scalp ( [id number])   SCC (squamous cell carcinoma) 08/27/2017   right post scalp (txpbx) insitu   SCC (squamous cell carcinoma) 05/05/2018   back of scalp (mohs) insitu   SCC (squamous cell carcinoma) 10/21/2018   top of right hand (txpbx) well diff   SCC (squamous cell carcinoma) 10/21/2018   back of crown (txpbx) insitu   SCC (squamous cell carcinoma) 06/18/2019   back of crown ( cx77fu) insitu   Squamous cell carcinoma of skin 05/10/2015   right crown insitu (cx41fu)   Type 2 diabetes mellitus ([place])     Surgical History: Past Surgical History:  Procedure Laterality Date   COLONOSCOPY     Deceased donor kidney transplant  2015    Home Medications:   Allergies as of 07/10/2021       Reactions   Lisinopril Other (See Comments)   Other reaction(s): Other (See Comments) Mouth,facial swelling  Mouth,facial swelling  Mouth,facial swelling         Medication List        Accurate as of July 10, 2021 11:12 AM. If you have any questions, ask your nurse or doctor.          acetaminophen 500 MG tablet Commonly known as: TYLENOL Take by mouth.   alfuzosin 10 MG 24 hr tablet Commonly known as: UROXATRAL Take 10 mg by mouth daily with breakfast.   allopurinol 300 MG tablet Commonly known as: ZYLOPRIM Take 150 mg by mouth daily.   amLODipine 5 MG tablet Commonly known as: NORVASC Take 1 tablet by mouth daily.   carvedilol 12.5 MG tablet Commonly known as: COREG   Cholecalciferol 25 MCG (1000 UT) tablet Take by mouth daily.   colchicine 0.6 MG tablet Take 0.6 mg by mouth 2 (two) times daily as needed.   Cyanocobalamin 2000 MCG Tbcr Take by mouth.   cycloSPORINE modified 25 MG capsule Commonly known as: NEORAL Take 150 mg by mouth 2 (two) times daily.   diclofenac Sodium 1 % Gel Commonly known as: VOLTAREN Apply topically as needed.   ELDERBERRY PO Take by mouth daily.   FeroSul 325 (65 FE)  MG tablet Generic drug: ferrous sulfate   Ferrex 150 150 MG capsule Generic drug: iron polysaccharides Take 1 capsule by mouth in the morning and at bedtime.   FreeStyle Libre 14 Day Reader Kerrin Mo See admin instructions.   FreeStyle Libre 14 Day Sensor Misc   furosemide 40 MG tablet Commonly known as: LASIX Take 80 mg by mouth in the morning and at bedtime.   glipiZIDE 10 MG tablet Commonly known as: GLUCOTROL Take by mouth 2 (two) times daily.   guaifenesin 400 MG Tabs tablet Commonly known as: HUMIBID E Take by mouth.   hydrALAZINE 100 MG tablet Commonly known as: APRESOLINE Take 1 tablet by mouth 3 (three) times daily.   hydrALAZINE 100 MG tablet Commonly known as: APRESOLINE   insulin glargine 100  UNIT/ML injection Commonly known as: LANTUS Inject 15 Units into the skin at bedtime.   losartan 50 MG tablet Commonly known as: COZAAR Take by mouth daily.   metolazone 2.5 MG tablet Commonly known as: ZAROXOLYN   MULTI-VITAMIN DAILY PO daily.   mycophenolate 180 MG EC tablet Commonly known as: MYFORTIC Take 2 tablets by mouth 2 (two) times daily.   Omega-3 1000 MG Caps Take by mouth daily.   OneTouch Ultra test strip Generic drug: glucose blood   OneTouch Ultra test strip Generic drug: glucose blood   tamsulosin 0.4 MG Caps capsule Commonly known as: FLOMAX   traMADol-acetaminophen 37.5-325 MG tablet Commonly known as: ULTRACET in the morning and at bedtime.   verapamil 240 MG CR tablet Commonly known as: CALAN-SR q po bid   warfarin 10 MG tablet Commonly known as: COUMADIN Take 5 mg by mouth daily.        Allergies:  Allergies  Allergen Reactions   Lisinopril Other (See Comments)    Other reaction(s): Other (See Comments) Mouth,facial swelling  Mouth,facial swelling  Mouth,facial swelling      Family History: Family History  Problem Relation Age of Onset   Colon cancer Neg Hx    Stomach cancer Neg Hx     Social History:  reports that he has quit smoking. His smoking use included cigarettes. He has never used smokeless tobacco. He reports current alcohol use. He reports that he does not use drugs.  ROS: All other review of systems were reviewed and are negative except what is noted above in HPI  Physical Exam: BP 109/64   Pulse 83   Ht 5\' 10"  (1.778 m)   Wt 170 lb (77.1 kg)   BMI 24.39 kg/m   Constitutional:  Alert and oriented, No acute distress. HEENT: [place] AT, moist mucus membranes.  Trachea midline, no masses. Cardiovascular: No clubbing, cyanosis, or edema. Respiratory: Normal respiratory effort, no increased work of breathing. GI: Abdomen is soft, nontender, nondistended, no abdominal masses GU: No CVA tenderness.  Lymph: No  cervical or inguinal lymphadenopathy. Skin: No rashes, bruises or suspicious lesions. Neurologic: Grossly intact, no focal deficits, moving all 4 extremities. Psychiatric: Normal mood and affect.  Laboratory Data: No results found for: WBC, HGB, HCT, MCV, PLT  No results found for: CREATININE  No results found for: PSA  No results found for: TESTOSTERONE  No results found for: HGBA1C  Urinalysis No results found for: COLORURINE, APPEARANCEUR, LABSPEC, PHURINE, GLUCOSEU, HGBUR, BILIRUBINUR, KETONESUR, PROTEINUR, UROBILINOGEN, NITRITE, LEUKOCYTESUR  No results found for: LABMICR, [place], RBCUA, LABEPIT, MUCUS, BACTERIA  Pertinent Imaging:  No results found for this or any previous visit.  No results found for this or any previous visit.  No results found for this or any previous visit.  No results found for this or any previous visit.  No results found for this or any previous visit.  No results found for this or any previous visit.  No results found for this or any previous visit.  No results found for this or any previous visit.   Assessment & Plan:    1. Lower urinary tract obstruction -We will start rapaflo 8mg  daily. RTC 1 week for a voiding trial - Urinalysis, Routine w reflex microscopic  2. Urinary retention -RTC 1 week for a voiding trial   No follow-ups on file.  Nicolette Bang, MD  [facility] [place]

## 2021-07-10 NOTE — Patient Instructions (Signed)
Benign Prostatic Hyperplasia  Benign prostatic hyperplasia (BPH) is an enlarged prostate gland that is caused by the normal aging process and not by cancer. The prostate is a walnut-sized gland that is involved in the production of semen. It is located in front of the rectum and below the bladder. The bladder stores urine and the urethra is the tube that carries the urine out of the body. The prostate may get bigger asa man gets older. An enlarged prostate can press on the urethra. This can make it harder to pass urine. The build-up of urine in the bladder can cause infection. Back pressure and infection may progress to bladder damage and kidney (renal) failure. What are the causes? This condition is part of a normal aging process. However, not all men develop problems from this condition. If the prostate enlarges away from the urethra, urine flow will not be blocked. If it enlarges toward the urethra andcompresses it, there will be problems passing urine. What increases the risk? This condition is more likely to develop in men over the age of 50 years. What are the signs or symptoms? Symptoms of this condition include: Getting up often during the night to urinate. Needing to urinate frequently during the day. Difficulty starting urine flow. Decrease in size and strength of your urine stream. Leaking (dribbling) after urinating. Inability to pass urine. This needs immediate treatment. Inability to completely empty your bladder. Pain when you pass urine. This is more common if there is also an infection. Urinary tract infection (UTI). How is this diagnosed? This condition is diagnosed based on your medical history, a physical exam, and your symptoms. Tests will also be done, such as: A post-void bladder scan. This measures any amount of urine that may remain in your bladder after you finish urinating. A digital rectal exam. In a rectal exam, your health care provider checks your prostate by  putting a lubricated, gloved finger into your rectum to feel the back of your prostate gland. This exam detects the size of your gland and any abnormal lumps or growths. An exam of your urine (urinalysis). A prostate specific antigen (PSA) screening. This is a blood test used to screen for prostate cancer. An ultrasound. This test uses sound waves to electronically produce a picture of your prostate gland. Your health care provider may refer you to a specialist in kidney and prostate diseases (urologist). How is this treated? Once symptoms begin, your health care provider will monitor your condition (active surveillance or watchful waiting). Treatment for this condition will depend on the severity of your condition. Treatment may include: Observation and yearly exams. This may be the only treatment needed if your condition and symptoms are mild. Medicines to relieve your symptoms, including: Medicines to shrink the prostate. Medicines to relax the muscle of the prostate. Surgery in severe cases. Surgery may include: Prostatectomy. In this procedure, the prostate tissue is removed completely through an open incision or with a laparoscope or robotics. Transurethral resection of the prostate (TURP). In this procedure, a tool is inserted through the opening at the tip of the penis (urethra). It is used to cut away tissue of the inner core of the prostate. The pieces are removed through the same opening of the penis. This removes the blockage. Transurethral incision (TUIP). In this procedure, small cuts are made in the prostate. This lessens the prostate's pressure on the urethra. Transurethral microwave thermotherapy (TUMT). This procedure uses microwaves to create heat. The heat destroys and removes a small   amount of prostate tissue. Transurethral needle ablation (TUNA). This procedure uses radio frequencies to destroy and remove a small amount of prostate tissue. Interstitial laser coagulation (ILC).  This procedure uses a laser to destroy and remove a small amount of prostate tissue. Transurethral electrovaporization (TUVP). This procedure uses electrodes to destroy and remove a small amount of prostate tissue. Prostatic urethral lift. This procedure inserts an implant to push the lobes of the prostate away from the urethra. Follow these instructions at home: Take over-the-counter and prescription medicines only as told by your health care provider. Monitor your symptoms for any changes. Contact your health care provider with any changes. Avoid drinking large amounts of liquid before going to bed or out in public. Avoid or reduce how much caffeine or alcohol you drink. Give yourself time when you urinate. Keep all follow-up visits as told by your health care provider. This is important. Contact a health care provider if: You have unexplained back pain. Your symptoms do not get better with treatment. You develop side effects from the medicine you are taking. Your urine becomes very dark or has a bad smell. Your lower abdomen becomes distended and you have trouble passing your urine. Get help right away if: You have a fever or chills. You suddenly cannot urinate. You feel lightheaded, or very dizzy, or you faint. There are large amounts of blood or clots in the urine. Your urinary problems become hard to manage. You develop moderate to severe low back or flank pain. The flank is the side of your body between the ribs and the hip. These symptoms may represent a serious problem that is an emergency. Do not wait to see if the symptoms will go away. Get medical help right away. Call your local emergency services (911 in the [place] Do not drive yourself to the hospital. Summary Benign prostatic hyperplasia (BPH) is an enlarged prostate that is caused by the normal aging process and not by cancer. An enlarged prostate can press on the urethra. This can make it hard to pass urine. This  condition is part of a normal aging process and is more likely to develop in men over the age of 50 years. Get help right away if you suddenly cannot urinate. This information is not intended to replace advice given to you by your health care provider. Make sure you discuss any questions you have with your healthcare provider. Document Revised: 07/28/2020 Document Reviewed: 07/28/2020 Elsevier Patient Education  2022 [employer]  

## 2021-07-11 DIAGNOSIS — J9610 Chronic respiratory failure, unspecified whether with hypoxia or hypercapnia: Secondary | ICD-10-CM | POA: Diagnosis not present

## 2021-07-11 DIAGNOSIS — Z9981 Dependence on supplemental oxygen: Secondary | ICD-10-CM | POA: Diagnosis not present

## 2021-07-11 DIAGNOSIS — Z992 Dependence on renal dialysis: Secondary | ICD-10-CM | POA: Diagnosis not present

## 2021-07-11 DIAGNOSIS — Z940 Kidney transplant status: Secondary | ICD-10-CM | POA: Diagnosis not present

## 2021-07-11 DIAGNOSIS — E782 Mixed hyperlipidemia: Secondary | ICD-10-CM | POA: Diagnosis not present

## 2021-07-11 DIAGNOSIS — E041 Nontoxic single thyroid nodule: Secondary | ICD-10-CM | POA: Diagnosis not present

## 2021-07-11 DIAGNOSIS — Z794 Long term (current) use of insulin: Secondary | ICD-10-CM | POA: Diagnosis not present

## 2021-07-11 DIAGNOSIS — M1030 Gout due to renal impairment, unspecified site: Secondary | ICD-10-CM | POA: Diagnosis not present

## 2021-07-11 DIAGNOSIS — D631 Anemia in chronic kidney disease: Secondary | ICD-10-CM | POA: Diagnosis not present

## 2021-07-11 DIAGNOSIS — G4733 Obstructive sleep apnea (adult) (pediatric): Secondary | ICD-10-CM | POA: Diagnosis not present

## 2021-07-11 DIAGNOSIS — I482 Chronic atrial fibrillation, unspecified: Secondary | ICD-10-CM | POA: Diagnosis not present

## 2021-07-11 DIAGNOSIS — I120 Hypertensive chronic kidney disease with stage 5 chronic kidney disease or end stage renal disease: Secondary | ICD-10-CM | POA: Diagnosis not present

## 2021-07-11 DIAGNOSIS — I69321 Dysphasia following cerebral infarction: Secondary | ICD-10-CM | POA: Diagnosis not present

## 2021-07-11 DIAGNOSIS — H9193 Unspecified hearing loss, bilateral: Secondary | ICD-10-CM | POA: Diagnosis not present

## 2021-07-11 DIAGNOSIS — J449 Chronic obstructive pulmonary disease, unspecified: Secondary | ICD-10-CM | POA: Diagnosis not present

## 2021-07-11 DIAGNOSIS — E1122 Type 2 diabetes mellitus with diabetic chronic kidney disease: Secondary | ICD-10-CM | POA: Diagnosis not present

## 2021-07-11 DIAGNOSIS — E1142 Type 2 diabetes mellitus with diabetic polyneuropathy: Secondary | ICD-10-CM | POA: Diagnosis not present

## 2021-07-11 DIAGNOSIS — N186 End stage renal disease: Secondary | ICD-10-CM | POA: Diagnosis not present

## 2021-07-11 DIAGNOSIS — I700 Atherosclerosis of aorta: Secondary | ICD-10-CM | POA: Diagnosis not present

## 2021-07-12 DIAGNOSIS — N189 Chronic kidney disease, unspecified: Secondary | ICD-10-CM | POA: Diagnosis not present

## 2021-07-12 DIAGNOSIS — E559 Vitamin D deficiency, unspecified: Secondary | ICD-10-CM | POA: Diagnosis not present

## 2021-07-12 DIAGNOSIS — N179 Acute kidney failure, unspecified: Secondary | ICD-10-CM | POA: Diagnosis not present

## 2021-07-12 DIAGNOSIS — D509 Iron deficiency anemia, unspecified: Secondary | ICD-10-CM | POA: Diagnosis not present

## 2021-07-12 DIAGNOSIS — Z23 Encounter for immunization: Secondary | ICD-10-CM | POA: Diagnosis not present

## 2021-07-12 DIAGNOSIS — I4891 Unspecified atrial fibrillation: Secondary | ICD-10-CM | POA: Diagnosis not present

## 2021-07-12 DIAGNOSIS — D649 Anemia, unspecified: Secondary | ICD-10-CM | POA: Diagnosis not present

## 2021-07-13 ENCOUNTER — Telehealth: Payer: Self-pay

## 2021-07-13 DIAGNOSIS — N186 End stage renal disease: Secondary | ICD-10-CM | POA: Diagnosis not present

## 2021-07-13 DIAGNOSIS — J449 Chronic obstructive pulmonary disease, unspecified: Secondary | ICD-10-CM | POA: Diagnosis not present

## 2021-07-13 DIAGNOSIS — D631 Anemia in chronic kidney disease: Secondary | ICD-10-CM | POA: Diagnosis not present

## 2021-07-13 DIAGNOSIS — I482 Chronic atrial fibrillation, unspecified: Secondary | ICD-10-CM | POA: Diagnosis not present

## 2021-07-13 DIAGNOSIS — I120 Hypertensive chronic kidney disease with stage 5 chronic kidney disease or end stage renal disease: Secondary | ICD-10-CM | POA: Diagnosis not present

## 2021-07-13 DIAGNOSIS — E1122 Type 2 diabetes mellitus with diabetic chronic kidney disease: Secondary | ICD-10-CM | POA: Diagnosis not present

## 2021-07-13 NOTE — Telephone Encounter (Signed)
See note below. Wife wants to know is patient should start back on finasteride not lasix. They are unable to afford the Rapaflo. Patient has been on Flomax for several years. Patient has been on dialysis for about 3 weeks and produces 1/2 cup to 1 cup of urine in a 24 hrs period. Please advise.

## 2021-07-13 NOTE — Telephone Encounter (Signed)
Patient cannot afford ($195.00)silodosin (RAPAFLO) 8 MG CAPS capsule.  Pt's PCP has prescribed a medication   furosemide (LASIX) 40 MG tablet Wants to know if he can take this medication until a decision on correct medication?  Please advise.  Call back: [phone]   Thanks, Helene Kelp

## 2021-07-14 DIAGNOSIS — D509 Iron deficiency anemia, unspecified: Secondary | ICD-10-CM | POA: Diagnosis not present

## 2021-07-14 DIAGNOSIS — D649 Anemia, unspecified: Secondary | ICD-10-CM | POA: Diagnosis not present

## 2021-07-14 DIAGNOSIS — N189 Chronic kidney disease, unspecified: Secondary | ICD-10-CM | POA: Diagnosis not present

## 2021-07-14 DIAGNOSIS — N179 Acute kidney failure, unspecified: Secondary | ICD-10-CM | POA: Diagnosis not present

## 2021-07-14 DIAGNOSIS — Z23 Encounter for immunization: Secondary | ICD-10-CM | POA: Diagnosis not present

## 2021-07-14 DIAGNOSIS — E559 Vitamin D deficiency, unspecified: Secondary | ICD-10-CM | POA: Diagnosis not present

## 2021-07-17 ENCOUNTER — Emergency Department (HOSPITAL_COMMUNITY): Payer: Medicare Other

## 2021-07-17 ENCOUNTER — Other Ambulatory Visit: Payer: Self-pay

## 2021-07-17 ENCOUNTER — Encounter (HOSPITAL_COMMUNITY): Payer: Self-pay

## 2021-07-17 ENCOUNTER — Inpatient Hospital Stay (HOSPITAL_COMMUNITY)
Admission: EM | Admit: 2021-07-17 | Discharge: 2021-07-21 | DRG: 698 | Disposition: A | Discharge status: Home Health | Payer: Medicare Other | Source: Ambulatory Visit | Attending: Internal Medicine | Admitting: Internal Medicine

## 2021-07-17 DIAGNOSIS — E785 Hyperlipidemia, unspecified: Secondary | ICD-10-CM | POA: Diagnosis present

## 2021-07-17 DIAGNOSIS — R0602 Shortness of breath: Secondary | ICD-10-CM | POA: Diagnosis not present

## 2021-07-17 DIAGNOSIS — D649 Anemia, unspecified: Secondary | ICD-10-CM | POA: Diagnosis not present

## 2021-07-17 DIAGNOSIS — R9431 Abnormal electrocardiogram [ECG] [EKG]: Secondary | ICD-10-CM | POA: Diagnosis present

## 2021-07-17 DIAGNOSIS — N186 End stage renal disease: Secondary | ICD-10-CM

## 2021-07-17 DIAGNOSIS — Z20822 Contact with and (suspected) exposure to covid-19: Secondary | ICD-10-CM | POA: Diagnosis present

## 2021-07-17 DIAGNOSIS — J9601 Acute respiratory failure with hypoxia: Secondary | ICD-10-CM

## 2021-07-17 DIAGNOSIS — I953 Hypotension of hemodialysis: Secondary | ICD-10-CM | POA: Diagnosis present

## 2021-07-17 DIAGNOSIS — R0689 Other abnormalities of breathing: Secondary | ICD-10-CM | POA: Diagnosis not present

## 2021-07-17 DIAGNOSIS — Z87891 Personal history of nicotine dependence: Secondary | ICD-10-CM

## 2021-07-17 DIAGNOSIS — J9621 Acute and chronic respiratory failure with hypoxia: Secondary | ICD-10-CM | POA: Diagnosis present

## 2021-07-17 DIAGNOSIS — E8779 Other fluid overload: Secondary | ICD-10-CM | POA: Diagnosis not present

## 2021-07-17 DIAGNOSIS — I4821 Permanent atrial fibrillation: Secondary | ICD-10-CM | POA: Diagnosis present

## 2021-07-17 DIAGNOSIS — L89329 Pressure ulcer of left buttock, unspecified stage: Secondary | ICD-10-CM

## 2021-07-17 DIAGNOSIS — Y830 Surgical operation with transplant of whole organ as the cause of abnormal reaction of the patient, or of later complication, without mention of misadventure at the time of the procedure: Secondary | ICD-10-CM | POA: Diagnosis present

## 2021-07-17 DIAGNOSIS — Z7901 Long term (current) use of anticoagulants: Secondary | ICD-10-CM

## 2021-07-17 DIAGNOSIS — R0902 Hypoxemia: Secondary | ICD-10-CM | POA: Diagnosis not present

## 2021-07-17 DIAGNOSIS — K279 Peptic ulcer, site unspecified, unspecified as acute or chronic, without hemorrhage or perforation: Secondary | ICD-10-CM | POA: Diagnosis not present

## 2021-07-17 DIAGNOSIS — I482 Chronic atrial fibrillation, unspecified: Secondary | ICD-10-CM

## 2021-07-17 DIAGNOSIS — T8612 Kidney transplant failure: Secondary | ICD-10-CM | POA: Diagnosis present

## 2021-07-17 DIAGNOSIS — J449 Chronic obstructive pulmonary disease, unspecified: Secondary | ICD-10-CM | POA: Diagnosis present

## 2021-07-17 DIAGNOSIS — Z8673 Personal history of transient ischemic attack (TIA), and cerebral infarction without residual deficits: Secondary | ICD-10-CM

## 2021-07-17 DIAGNOSIS — D509 Iron deficiency anemia, unspecified: Secondary | ICD-10-CM | POA: Diagnosis not present

## 2021-07-17 DIAGNOSIS — D631 Anemia in chronic kidney disease: Secondary | ICD-10-CM | POA: Diagnosis present

## 2021-07-17 DIAGNOSIS — Z940 Kidney transplant status: Secondary | ICD-10-CM

## 2021-07-17 DIAGNOSIS — R338 Other retention of urine: Secondary | ICD-10-CM | POA: Diagnosis present

## 2021-07-17 DIAGNOSIS — Z992 Dependence on renal dialysis: Secondary | ICD-10-CM

## 2021-07-17 DIAGNOSIS — A419 Sepsis, unspecified organism: Secondary | ICD-10-CM | POA: Diagnosis present

## 2021-07-17 DIAGNOSIS — E559 Vitamin D deficiency, unspecified: Secondary | ICD-10-CM | POA: Diagnosis not present

## 2021-07-17 DIAGNOSIS — R1310 Dysphagia, unspecified: Secondary | ICD-10-CM

## 2021-07-17 DIAGNOSIS — N139 Obstructive and reflux uropathy, unspecified: Secondary | ICD-10-CM

## 2021-07-17 DIAGNOSIS — Z23 Encounter for immunization: Secondary | ICD-10-CM | POA: Diagnosis not present

## 2021-07-17 DIAGNOSIS — N250 Renal osteodystrophy: Secondary | ICD-10-CM | POA: Diagnosis not present

## 2021-07-17 DIAGNOSIS — E7849 Other hyperlipidemia: Secondary | ICD-10-CM | POA: Diagnosis not present

## 2021-07-17 DIAGNOSIS — R54 Age-related physical debility: Secondary | ICD-10-CM | POA: Diagnosis present

## 2021-07-17 DIAGNOSIS — E1122 Type 2 diabetes mellitus with diabetic chronic kidney disease: Secondary | ICD-10-CM | POA: Diagnosis present

## 2021-07-17 DIAGNOSIS — Z85828 Personal history of other malignant neoplasm of skin: Secondary | ICD-10-CM

## 2021-07-17 DIAGNOSIS — N179 Acute kidney failure, unspecified: Secondary | ICD-10-CM | POA: Diagnosis not present

## 2021-07-17 DIAGNOSIS — I120 Hypertensive chronic kidney disease with stage 5 chronic kidney disease or end stage renal disease: Secondary | ICD-10-CM | POA: Diagnosis present

## 2021-07-17 DIAGNOSIS — Z7984 Long term (current) use of oral hypoglycemic drugs: Secondary | ICD-10-CM

## 2021-07-17 DIAGNOSIS — R339 Retention of urine, unspecified: Secondary | ICD-10-CM | POA: Diagnosis not present

## 2021-07-17 DIAGNOSIS — E119 Type 2 diabetes mellitus without complications: Secondary | ICD-10-CM | POA: Diagnosis not present

## 2021-07-17 DIAGNOSIS — Z794 Long term (current) use of insulin: Secondary | ICD-10-CM | POA: Diagnosis not present

## 2021-07-17 DIAGNOSIS — R531 Weakness: Secondary | ICD-10-CM | POA: Diagnosis not present

## 2021-07-17 DIAGNOSIS — J811 Chronic pulmonary edema: Secondary | ICD-10-CM | POA: Diagnosis not present

## 2021-07-17 DIAGNOSIS — J810 Acute pulmonary edema: Secondary | ICD-10-CM | POA: Diagnosis present

## 2021-07-17 DIAGNOSIS — Y846 Urinary catheterization as the cause of abnormal reaction of the patient, or of later complication, without mention of misadventure at the time of the procedure: Secondary | ICD-10-CM | POA: Diagnosis present

## 2021-07-17 DIAGNOSIS — L8990 Pressure ulcer of unspecified site, unspecified stage: Secondary | ICD-10-CM | POA: Diagnosis present

## 2021-07-17 DIAGNOSIS — N138 Other obstructive and reflux uropathy: Secondary | ICD-10-CM | POA: Diagnosis present

## 2021-07-17 DIAGNOSIS — E11649 Type 2 diabetes mellitus with hypoglycemia without coma: Secondary | ICD-10-CM | POA: Diagnosis not present

## 2021-07-17 DIAGNOSIS — J90 Pleural effusion, not elsewhere classified: Secondary | ICD-10-CM | POA: Diagnosis not present

## 2021-07-17 DIAGNOSIS — I4891 Unspecified atrial fibrillation: Secondary | ICD-10-CM

## 2021-07-17 DIAGNOSIS — Z79899 Other long term (current) drug therapy: Secondary | ICD-10-CM

## 2021-07-17 DIAGNOSIS — M898X9 Other specified disorders of bone, unspecified site: Secondary | ICD-10-CM | POA: Diagnosis present

## 2021-07-17 DIAGNOSIS — L89322 Pressure ulcer of left buttock, stage 2: Secondary | ICD-10-CM | POA: Diagnosis present

## 2021-07-17 DIAGNOSIS — I10 Essential (primary) hypertension: Secondary | ICD-10-CM | POA: Diagnosis not present

## 2021-07-17 DIAGNOSIS — T83511A Infection and inflammatory reaction due to indwelling urethral catheter, initial encounter: Principal | ICD-10-CM | POA: Diagnosis present

## 2021-07-17 DIAGNOSIS — N189 Chronic kidney disease, unspecified: Secondary | ICD-10-CM | POA: Diagnosis not present

## 2021-07-17 DIAGNOSIS — N401 Enlarged prostate with lower urinary tract symptoms: Secondary | ICD-10-CM | POA: Diagnosis present

## 2021-07-17 DIAGNOSIS — Z888 Allergy status to other drugs, medicaments and biological substances status: Secondary | ICD-10-CM

## 2021-07-17 DIAGNOSIS — I959 Hypotension, unspecified: Secondary | ICD-10-CM | POA: Diagnosis not present

## 2021-07-17 LAB — CBC WITH DIFFERENTIAL/PLATELET
Abs Immature Granulocytes: 0.06 10*3/uL (ref 0.00–0.07)
Basophils Absolute: 0 10*3/uL (ref 0.0–0.1)
Basophils Relative: 0 %
Eosinophils Absolute: 0 10*3/uL (ref 0.0–0.5)
Eosinophils Relative: 0 %
HCT: 27.5 % — ABNORMAL LOW (ref 39.0–52.0)
Hemoglobin: 8.6 g/dL — ABNORMAL LOW (ref 13.0–17.0)
Immature Granulocytes: 1 %
Lymphocytes Relative: 4 %
Lymphs Abs: 0.3 10*3/uL — ABNORMAL LOW (ref 0.7–4.0)
MCH: 29.7 pg (ref 26.0–34.0)
MCHC: 31.3 g/dL (ref 30.0–36.0)
MCV: 94.8 fL (ref 80.0–100.0)
Monocytes Absolute: 0.4 10*3/uL (ref 0.1–1.0)
Monocytes Relative: 5 %
Neutro Abs: 8.4 10*3/uL — ABNORMAL HIGH (ref 1.7–7.7)
Neutrophils Relative %: 90 %
Platelets: 240 10*3/uL (ref 150–400)
RBC: 2.9 MIL/uL — ABNORMAL LOW (ref 4.22–5.81)
RDW: 16.2 % — ABNORMAL HIGH (ref 11.5–15.5)
WBC: 9.2 10*3/uL (ref 4.0–10.5)
nRBC: 0 % (ref 0.0–0.2)

## 2021-07-17 LAB — COMPREHENSIVE METABOLIC PANEL
ALT: 20 U/L (ref 0–44)
AST: 40 U/L (ref 15–41)
Albumin: 2.9 g/dL — ABNORMAL LOW (ref 3.5–5.0)
Alkaline Phosphatase: 73 U/L (ref 38–126)
Anion gap: 11 (ref 5–15)
BUN: 37 mg/dL — ABNORMAL HIGH (ref 8–23)
CO2: 25 mmol/L (ref 22–32)
Calcium: 8.1 mg/dL — ABNORMAL LOW (ref 8.9–10.3)
Chloride: 97 mmol/L — ABNORMAL LOW (ref 98–111)
Creatinine, Ser: 3.18 mg/dL — ABNORMAL HIGH (ref 0.61–1.24)
GFR, Estimated: 19 mL/min — ABNORMAL LOW (ref >60)
Glucose, Bld: 214 mg/dL — ABNORMAL HIGH (ref 70–99)
Potassium: 4.2 mmol/L (ref 3.5–5.1)
Sodium: 133 mmol/L — ABNORMAL LOW (ref 135–145)
Total Bilirubin: 1.3 mg/dL — ABNORMAL HIGH (ref 0.3–1.2)
Total Protein: 6 g/dL — ABNORMAL LOW (ref 6.5–8.1)

## 2021-07-17 LAB — PROTIME-INR
INR: 1.6 — ABNORMAL HIGH (ref 0.8–1.2)
Prothrombin Time: 19 seconds — ABNORMAL HIGH (ref 11.4–15.2)

## 2021-07-17 LAB — LACTIC ACID, PLASMA
Lactic Acid, Venous: 2.1 mmol/L (ref 0.5–1.9)
Lactic Acid, Venous: 1.7 mmol/L (ref 0.5–1.9)

## 2021-07-17 LAB — APTT: aPTT: 35 seconds (ref 24–36)

## 2021-07-17 LAB — BRAIN NATRIURETIC PEPTIDE: B Natriuretic Peptide: 1278 pg/mL — ABNORMAL HIGH (ref 0.0–100.0)

## 2021-07-17 LAB — RESP PANEL BY RT-PCR (FLU A&B, COVID) ARPGX2
Influenza A by PCR: NEGATIVE
Influenza B by PCR: NEGATIVE
SARS Coronavirus 2 by RT PCR: NEGATIVE

## 2021-07-17 MED ORDER — WARFARIN - PHARMACIST DOSING INPATIENT
Freq: Every day | Status: DC
Start: 2021-07-18 — End: 2021-07-21

## 2021-07-17 MED ORDER — LACTATED RINGERS IV SOLN: INTRAVENOUS | Status: DC

## 2021-07-17 MED ORDER — PENTAFLUOROPROP-TETRAFLUOROETH EX AERO
1.0000 "application " | INHALATION_SPRAY | CUTANEOUS | Status: DC | PRN
  Filled 2021-07-17: qty 30

## 2021-07-17 MED ORDER — PANTOPRAZOLE SODIUM 40 MG PO TBEC
40.0000 mg | DELAYED_RELEASE_TABLET | Freq: Two times a day (BID) | ORAL | Status: DC
  Administered 2021-07-17 – 2021-07-21 (×8): 40 mg via ORAL
  Filled 2021-07-17 (×8): qty 1

## 2021-07-17 MED ORDER — SODIUM CHLORIDE 0.9 % IV BOLUS (SEPSIS)
500.0000 mL | Freq: Once | INTRAVENOUS | Status: AC
  Administered 2021-07-17: 500 mL via INTRAVENOUS

## 2021-07-17 MED ORDER — SODIUM CHLORIDE 0.9 % IV SOLN: 100.0000 mL | INTRAVENOUS | Status: DC | PRN

## 2021-07-17 MED ORDER — HEPARIN SODIUM (PORCINE) 1000 UNIT/ML DIALYSIS
1000.0000 [IU] | INTRAMUSCULAR | Status: DC | PRN
  Administered 2021-07-20: 1200 [IU] via INTRAVENOUS_CENTRAL

## 2021-07-17 MED ORDER — ACETAMINOPHEN 650 MG RE SUPP: 650.0000 mg | Freq: Four times a day (QID) | RECTAL | Status: DC | PRN

## 2021-07-17 MED ORDER — INSULIN GLARGINE-YFGN 100 UNIT/ML ~~LOC~~ SOLN
4.0000 [IU] | Freq: Every day | SUBCUTANEOUS | Status: DC
  Administered 2021-07-18 – 2021-07-20 (×4): 4 [IU] via SUBCUTANEOUS
  Filled 2021-07-17 (×7): qty 0.04

## 2021-07-17 MED ORDER — DICLOFENAC SODIUM 1 % EX GEL
2.0000 g | Freq: Four times a day (QID) | CUTANEOUS | Status: DC | PRN
  Filled 2021-07-17: qty 100

## 2021-07-17 MED ORDER — INSULIN ASPART 100 UNIT/ML IJ SOLN
0.0000 [IU] | Freq: Three times a day (TID) | INTRAMUSCULAR | Status: DC
  Administered 2021-07-19 – 2021-07-20 (×2): 1 [IU] via SUBCUTANEOUS
  Administered 2021-07-21: 2 [IU] via SUBCUTANEOUS

## 2021-07-17 MED ORDER — LIDOCAINE-PRILOCAINE 2.5-2.5 % EX CREA: 1.0000 "application " | TOPICAL_CREAM | CUTANEOUS | Status: DC | PRN

## 2021-07-17 MED ORDER — TAMSULOSIN HCL 0.4 MG PO CAPS
0.4000 mg | ORAL_CAPSULE | Freq: Two times a day (BID) | ORAL | Status: DC
  Administered 2021-07-17 – 2021-07-21 (×8): 0.4 mg via ORAL
  Filled 2021-07-17 (×8): qty 1

## 2021-07-17 MED ORDER — WARFARIN SODIUM 6 MG PO TABS
6.0000 mg | ORAL_TABLET | Freq: Once | ORAL | Status: AC
  Administered 2021-07-18: 6 mg via ORAL
  Filled 2021-07-17: qty 3
  Filled 2021-07-17: qty 1

## 2021-07-17 MED ORDER — ONDANSETRON HCL 4 MG/2ML IJ SOLN: 4.0000 mg | Freq: Four times a day (QID) | INTRAMUSCULAR | Status: DC | PRN

## 2021-07-17 MED ORDER — FINASTERIDE 5 MG PO TABS
5.0000 mg | ORAL_TABLET | Freq: Every day | ORAL | Status: DC
  Administered 2021-07-18 – 2021-07-21 (×4): 5 mg via ORAL
  Filled 2021-07-17 (×4): qty 1

## 2021-07-17 MED ORDER — CHLORHEXIDINE GLUCONATE CLOTH 2 % EX PADS
6.0000 | MEDICATED_PAD | Freq: Every day | CUTANEOUS | Status: DC
  Administered 2021-07-18 – 2021-07-21 (×3): 6 via TOPICAL

## 2021-07-17 MED ORDER — ACETAMINOPHEN 325 MG PO TABS
650.0000 mg | ORAL_TABLET | Freq: Four times a day (QID) | ORAL | Status: DC | PRN
  Administered 2021-07-20: 650 mg via ORAL
  Filled 2021-07-17: qty 2

## 2021-07-17 MED ORDER — CYCLOSPORINE MODIFIED (NEORAL) 100 MG PO CAPS
100.0000 mg | ORAL_CAPSULE | Freq: Two times a day (BID) | ORAL | Status: DC
  Administered 2021-07-18 – 2021-07-21 (×8): 100 mg via ORAL
  Filled 2021-07-17 (×15): qty 1

## 2021-07-17 MED ORDER — ALTEPLASE 2 MG IJ SOLR
2.0000 mg | Freq: Once | INTRAMUSCULAR | Status: DC | PRN
  Filled 2021-07-17: qty 2

## 2021-07-17 MED ORDER — LIDOCAINE HCL (PF) 1 % IJ SOLN: 5.0000 mL | INTRAMUSCULAR | Status: DC | PRN

## 2021-07-17 MED ORDER — MYCOPHENOLATE SODIUM 180 MG PO TBEC
360.0000 mg | DELAYED_RELEASE_TABLET | Freq: Two times a day (BID) | ORAL | Status: DC
  Administered 2021-07-18: 360 mg via ORAL
  Filled 2021-07-17 (×4): qty 2

## 2021-07-17 MED ORDER — ONDANSETRON HCL 4 MG PO TABS: 4.0000 mg | ORAL_TABLET | Freq: Four times a day (QID) | ORAL | Status: DC | PRN

## 2021-07-17 NOTE — H&P (Signed)
History and Physical    Kain Milosevic CHE:[id number] DOB: Feb 14, 1941 DOA: 07/17/2021  PCP: Caryl Bis, MD   Patient coming from: Home   Chief Complaint: Somnolent, hypotension, hypoxia   HPI: Kevin Frey is a 80 y.o. male with medical history significant for atrial fibrillation on warfarin, ESRD with failed renal transplant, insulin-dependent diabetes mellitus, hypertension, now presenting to the emergency department for evaluation of somnolence, hypotension, and hypoxia.  Patient is accompanied by his wife who assists with the history.  Patient has been little more short of breath than usual recently but had otherwise been doing fairly well at home yesterday, had trouble sleeping last night, was up very early this morning, and then reports that he fell asleep and was having trouble waking up after dialysis was completed.  His wife reports that he completed dialysis but that they were unable to pull any fluid off due to hypotension.  They had trouble waking him up afterwards and called EMS who found the patient to have a blood pressure in the 53I systolic, was difficult to arouse initially, and was saturating 70% on his usual 4 L/min of supplemental oxygen.  He was placed on 10 L/min, became more alert, and blood pressure also improved prior to his arrival in the ED.  He denies any change in chronic mild cough, denies any recent chest pain, denies fevers or chills, and denies any headache, change in vision, or focal numbness or weakness.    The patient was admitted to [facility] for acute on chronic renal failure recently, admission was complicated by upper GI bleed with hemorrhagic shock, UTI and pneumonia with sepsis, was there for more than 3 weeks and discharged on 06/23/2021.  He had a catheter placed in the right IJ and was started on hemodialysis during the admission.  He was supratherapeutic with his warfarin, developed hemorrhagic shock, had bleeding duodenal  ulcer on EGD, was transfused 9 units RBC, 1 unit of platelets, and started on 8 weeks of PPI therapy.  He was also treated with 7 days of meropenem during the hospitalization.  ED Course: Upon arrival to the ED, patient is found to be afebrile, requiring 6 L/min of supplemental oxygen, mildly tachypneic, and with systolic blood pressure in the low 100s.  EKG features atrial fibrillation with QTc interval 534 ms.  Chest x-ray with slightly prominent cardiac silhouette, pulmonary edema, and bilateral trace to small pleural effusions.  Chemistry panel sodium 133, normal bicarbonate, BUN 37, glucose 214, and albumin 2.9.  CBC features hemoglobin of 8.6.  Lactic acid was 2.1 initially, INR 1.6, and BNP 1278.  Patient had been given 500 cc of saline and started on LR infusion in the emergency department.  Nephrology was consulted by the ED physician and recommends admission for inpatient dialysis.  Review of Systems:  All other systems reviewed and apart from HPI, are negative.  Past Medical History:  Diagnosis Date   Acute ischemic vertebrobasilar artery thalamic stroke ([place]) 2015   Atrial fibrillation (HCC)    On Coumadin   Basal cell carcinoma 01/08/2017   right forearm ( txpbx)   COPD (chronic obstructive pulmonary disease) (HCC)    Deceased-donor kidney transplant 2015   Diabetic nephropathy (HCC)    End stage renal disease (HCC)    FSGS (focal segmental glomerulosclerosis)    HLD (hyperlipidemia)    HTN (hypertension)    Long-term use of immunosuppressant medication    SCC (squamous cell carcinoma) 05/10/2015   right side  of face (cx50fu)   SCC (squamous cell carcinoma) 04/25/2016   right post scalp ( cx44fu)   SCC (squamous cell carcinoma) 08/27/2017   right post scalp (txpbx) insitu   SCC (squamous cell carcinoma) 05/05/2018   back of scalp (mohs) insitu   SCC (squamous cell carcinoma) 10/21/2018   top of right hand (txpbx) well diff   SCC (squamous cell carcinoma) 10/21/2018    back of crown (txpbx) insitu   SCC (squamous cell carcinoma) 06/18/2019   back of crown ( cx20fu) insitu   Squamous cell carcinoma of skin 05/10/2015   right crown insitu (cx39fu)   Type 2 diabetes mellitus ([place])     Past Surgical History:  Procedure Laterality Date   COLONOSCOPY     Deceased donor kidney transplant  2015    Social History:   reports that he has quit smoking. His smoking use included cigarettes. He has never used smokeless tobacco. He reports current alcohol use. He reports that he does not use drugs.  Allergies  Allergen Reactions   Lisinopril Other (See Comments)    Other reaction(s): Other (See Comments) Mouth,facial swelling  Mouth,facial swelling  Mouth,facial swelling      Family History  Problem Relation Age of Onset   Colon cancer Neg Hx    Stomach cancer Neg Hx      Prior to Admission medications   Medication Sig Start Date End Date Taking? Authorizing Provider  acetaminophen (TYLENOL) 500 MG tablet Take 500 mg by mouth in the morning and at bedtime.   Yes [provider]  Cholecalciferol 25 MCG (1000 UT) tablet Take by mouth daily.   Yes [provider]  colchicine 0.6 MG tablet Take 0.6 mg by mouth 2 (two) times daily as needed.   Yes [provider]  cycloSPORINE modified (NEORAL) 100 MG capsule Take 1 capsule by mouth 2 (two) times daily. 07/07/21 07/07/22 Yes [provider]  diclofenac Sodium (VOLTAREN) 1 % GEL Apply topically as needed.   Yes [provider]  insulin glargine (LANTUS) 100 UNIT/ML injection Inject 6 Units into the skin at bedtime. 02/03/16  Yes [provider]  ipratropium-albuterol (DUONEB) 0.5-2.5 (3) MG/3ML SOLN Inhale into the lungs. 03/02/21  Yes [provider]  multivitamin (RENA-VIT) TABS tablet Take 1 tablet by mouth daily. 07/03/21  Yes [provider]  mycophenolate (MYFORTIC) 180 MG EC tablet Take 2 tablets by mouth 2 (two) times daily. 09/01/20  09/01/21 Yes [provider]  Omega-3 1000 MG CAPS Take 1 capsule by mouth 2 (two) times daily.   Yes [provider]  pantoprazole (PROTONIX) 40 MG tablet Take 40 mg by mouth 2 (two) times daily. 06/21/21 08/20/21 Yes [provider]  tamsulosin (FLOMAX) 0.4 MG CAPS capsule Take 0.4 mg by mouth 2 (two) times daily. 08/08/20  Yes [provider]  warfarin (COUMADIN) 10 MG tablet Take 6 mg by mouth daily. Take 3mg  every other day and 6mg  on opposite days 02/23/20  Yes [provider]  alfuzosin (UROXATRAL) 10 MG 24 hr tablet Take 10 mg by mouth daily with breakfast. Patient not taking: No sig reported    [provider]  allopurinol (ZYLOPRIM) 300 MG tablet Take 150 mg by mouth daily. Patient not taking: No sig reported    [provider]  amLODipine (NORVASC) 5 MG tablet Take 1 tablet by mouth daily. Patient not taking: No sig reported 04/02/17   [provider]  carvedilol (COREG) 12.5 MG tablet  06/10/20  [provider]  Continuous Blood Gluc Receiver (FREESTYLE LIBRE 14 DAY READER) [place] See admin instructions. 07/18/18   [provider]  Continuous Blood Gluc Sensor (FREESTYLE LIBRE 14 DAY SENSOR) MISC  12/05/19   [provider]  Cyanocobalamin 2000 MCG TBCR Take by mouth. Patient not taking: Reported on 07/17/2021    [provider]  cycloSPORINE modified (NEORAL) 25 MG capsule Take 150 mg by mouth 2 (two) times daily. Patient not taking: No sig reported    [provider]  ELDERBERRY PO Take by mouth daily. Patient not taking: No sig reported    [provider]  FEROSUL 325 (65 Fe) MG tablet  08/31/20   [provider]  FERREX 150 150 MG capsule Take 1 capsule by mouth in the morning and at bedtime. Patient not taking: Reported on 07/17/2021 02/19/20   [provider]  furosemide (LASIX) 40 MG tablet Take 80 mg by mouth in the morning and at bedtime. Patient  not taking: No sig reported 02/08/20   [provider]  glipiZIDE (GLUCOTROL) 10 MG tablet Take by mouth 2 (two) times daily. Patient not taking: No sig reported 06/15/04   [provider]  glucose blood (ONETOUCH ULTRA) test strip  02/20/18   [provider]  glucose blood (ONETOUCH ULTRA) test strip  02/20/18   [provider]  guaifenesin (HUMIBID E) 400 MG TABS tablet Take by mouth. Patient not taking: No sig reported    [provider]  hydrALAZINE (APRESOLINE) 100 MG tablet  04/01/20   [provider]  hydrALAZINE (APRESOLINE) 100 MG tablet Take 1 tablet by mouth 3 (three) times daily. Patient not taking: No sig reported 02/26/17   [provider]  losartan (COZAAR) 50 MG tablet Take by mouth daily. Patient not taking: No sig reported 06/15/04   [provider]  metolazone (ZAROXOLYN) 2.5 MG tablet  07/25/20   [provider]  Multiple Vitamin (MULTI-VITAMIN DAILY PO) daily. Patient not taking: No sig reported    [provider]  silodosin (RAPAFLO) 8 MG CAPS capsule TAKE 1 CAPSULE BY MOUTH ONCE DAILY WITH BREAKFAST Patient not taking: No sig reported 07/13/21   Cleon Gustin, MD  traMADol-acetaminophen (ULTRACET) 37.5-325 MG tablet in the morning and at bedtime. Patient not taking: No sig reported 03/02/20   [provider]  verapamil (CALAN-SR) 240 MG CR tablet q po bid Patient not taking: No sig reported 06/15/04   [provider]    Physical Exam: Vitals:   07/17/21 1830 07/17/21 1900 07/17/21 2000 07/17/21 2100  BP: 130/84 133/84 139/90 (!) 143/98  Pulse: (!) 57 91 (!) 103 100  Resp: 17 18 18 18   Temp:      TempSrc:      SpO2: 93% 100% 100% 100%  Weight:      Height:        Constitutional: NAD, calm  Eyes: PERTLA, lids and conjunctivae normal ENMT: Mucous membranes are moist. Posterior pharynx clear of any exudate or lesions.   Neck: supple, no masses  Respiratory:  Rales bilaterally, dyspnea with speech. No cyanosis.  Cardiovascular: Rate ~80 and irregular. No extremity edema.  Abdomen: No distension, no tenderness, soft. Bowel sounds active.  Musculoskeletal: no clubbing / cyanosis. No joint deformity upper and lower extremities.   Skin: no significant rashes, lesions, ulcers noted. Warm, dry, well-perfused. Neurologic: Gross hearing deficit. No facial asymmetry. Moving all extremities.  Psychiatric: Alert and oriented to person, place, and situation. Pleasant  and cooperative.    Labs and Imaging on Admission: I have personally reviewed following labs and imaging studies  CBC: Recent Labs  Lab 07/17/21 1828  WBC 9.2  NEUTROABS 8.4*  HGB 8.6*  HCT 27.5*  MCV 94.8  PLT 335   Basic Metabolic Panel: Recent Labs  Lab 07/17/21 1828  NA 133*  K 4.2  CL 97*  CO2 25  GLUCOSE 214*  BUN 37*  CREATININE 3.18*  CALCIUM 8.1*   GFR: Estimated Creatinine Clearance: 23.9 mL/min (A) (by C-G formula based on SCr of 3.18 mg/dL (H)). Liver Function Tests: Recent Labs  Lab 07/17/21 1828  AST 40  ALT 20  ALKPHOS 73  BILITOT 1.3*  PROT 6.0*  ALBUMIN 2.9*   No results for input(s): LIPASE, AMYLASE in the last 168 hours. No results for input(s): AMMONIA in the last 168 hours. Coagulation Profile: Recent Labs  Lab 07/17/21 1828  INR 1.6*   Cardiac Enzymes: No results for input(s): CKTOTAL, CKMB, CKMBINDEX, TROPONINI in the last 168 hours. BNP (last 3 results) No results for input(s): PROBNP in the last 8760 hours. HbA1C: No results for input(s): HGBA1C in the last 72 hours. CBG: No results for input(s): GLUCAP in the last 168 hours. Lipid Profile: No results for input(s): CHOL, HDL, LDLCALC, TRIG, CHOLHDL, LDLDIRECT in the last 72 hours. Thyroid Function Tests: No results for input(s): TSH, T4TOTAL, FREET4, T3FREE, THYROIDAB in the last 72 hours. Anemia Panel: No results for input(s): VITAMINB12, FOLATE, FERRITIN, TIBC, IRON,  RETICCTPCT in the last 72 hours. Urine analysis: No results found for: COLORURINE, APPEARANCEUR, LABSPEC, PHURINE, GLUCOSEU, HGBUR, BILIRUBINUR, KETONESUR, PROTEINUR, UROBILINOGEN, NITRITE, LEUKOCYTESUR Sepsis Labs: @LABRCNTIP (procalcitonin:4,lacticidven:4) ) Recent Results (from the past 240 hour(s))  Resp Panel by RT-PCR (Flu A&B, Covid) Nasopharyngeal Swab     Status: None   Collection Time: 07/17/21  5:38 PM   Specimen: Nasopharyngeal Swab; Nasopharyngeal(NP) swabs in vial transport medium  Result Value Ref Range Status   SARS Coronavirus 2 by RT PCR NEGATIVE NEGATIVE Final    Comment: (NOTE) SARS-CoV-2 target nucleic acids are NOT DETECTED.  The SARS-CoV-2 RNA is generally detectable in upper respiratory specimens during the acute phase of infection. The lowest concentration of SARS-CoV-2 viral copies this assay can detect is 138 copies/mL. A negative result does not preclude SARS-Cov-2 infection and should not be used as the sole basis for treatment or other patient management decisions. A negative result may occur with  improper specimen collection/handling, submission of specimen other than nasopharyngeal swab, presence of viral mutation(s) within the areas targeted by this assay, and inadequate number of viral copies(<138 copies/mL). A negative result must be combined with clinical observations, patient history, and epidemiological information. The expected result is Negative.  Fact Sheet for Patients:  EntrepreneurPulse.com.au  Fact Sheet for Healthcare Providers:  IncredibleEmployment.be  This test is no t yet approved or cleared by the [place] FDA and  has been authorized for detection and/or diagnosis of SARS-CoV-2 by FDA under an Emergency Use Authorization (EUA). This EUA will remain  in effect (meaning this test can be used) for the duration of the COVID-19 declaration under Section 564(b)(1) of the Act, 21 U.S.C.section  360bbb-3(b)(1), unless the authorization is terminated  or revoked sooner.       Influenza A by PCR NEGATIVE NEGATIVE Final   Influenza B by PCR NEGATIVE NEGATIVE Final    Comment: (NOTE) The Xpert Xpress SARS-CoV-2/FLU/RSV plus assay is intended as an aid in the diagnosis of influenza from Nasopharyngeal  swab specimens and should not be used as a sole basis for treatment. Nasal washings and aspirates are unacceptable for Xpert Xpress SARS-CoV-2/FLU/RSV testing.  Fact Sheet for Patients: EntrepreneurPulse.com.au  Fact Sheet for Healthcare Providers: IncredibleEmployment.be  This test is not yet approved or cleared by the [place] FDA and has been authorized for detection and/or diagnosis of SARS-CoV-2 by FDA under an Emergency Use Authorization (EUA). This EUA will remain in effect (meaning this test can be used) for the duration of the COVID-19 declaration under Section 564(b)(1) of the Act, 21 U.S.C. section 360bbb-3(b)(1), unless the authorization is terminated or revoked.  Performed at [facility], [address] [place], [place] [address]   Blood Culture (routine x 2)     Status: None (Preliminary result)   Collection Time: 07/17/21  6:28 PM   Specimen: BLOOD RIGHT HAND  Result Value Ref Range Status   Specimen Description BLOOD RIGHT HAND  Final   Special Requests   Final    BOTTLES DRAWN AEROBIC AND ANAEROBIC Blood Culture adequate volume Performed at [facility], [address] [place], [place] [address]    Culture PENDING  Incomplete   Report Status PENDING  Incomplete  Blood Culture (routine x 2)     Status: None (Preliminary result)   Collection Time: 07/17/21  6:28 PM   Specimen: BLOOD RIGHT FOREARM  Result Value Ref Range Status   Specimen Description BLOOD RIGHT FOREARM  Final   Special Requests   Final    BOTTLES DRAWN AEROBIC AND ANAEROBIC Blood Culture adequate volume Performed at [facility], [address], [place], [place] [address]    Culture PENDING  Incomplete   Report Status PENDING  Incomplete     Radiological Exams on Admission: DG Chest Port 1 View  Result Date: 07/17/2021 CLINICAL DATA:  Questionable sepsis. States pt was at dialysis, pt hypotensive upon arrival, became hard to arouse, on oxygen per patient. Sats at 70% on 4L. Afib on monitor hx of COPD and HTN, former smoker. EXAM: PORTABLE CHEST 1 VIEW COMPARISON:  Chest x-ray 05/31/2025. FINDINGS: Right chest wall dialysis catheter upper with tip overlying the expected region of superior caval junction. Cardiac silhouette slightly prominent possibly due to portable AP technique and low lung volumes. The heart size and mediastinal contours are within normal limits. Prominence of the hilar vasculature. Aortic calcification. Low lung volumes. No focal consolidation. Increased interstitial markings. Bilateral trace to small volume pleural effusions. No pneumothorax. No acute osseous abnormality. IMPRESSION: Slightly prominent cardiac silhouette. Associated pulmonary edema with bilateral trace to small volume pleural effusions. Superimposed infection/inflammation not excluded. Electronically Signed   By: Iven Finn M.D.   On: 07/17/2021 18:20    EKG: Independently reviewed. Atrial fibrillation.   Assessment/Plan   1. Acute pulmonary edema; acute on chronic hypoxic respiratory failure  - Presents from HD where he was somnolent, hypotensive, and hypoxic  - BP has been stable in ED and patient now alert but has pulmonary edema and is requiring increased FiO2  - He had preserved EF on echo last month, makes very little urine, and has been requiring HD for the mast few weeks  - Nephrology consulting and much appreciated  - Volume removal with HD, restrict fluids, continue supplemental O2 and supportive care   2. ESRD; failed renal transplant  - Hypervolemic on presentation with pulm edema and acute on chronic hypoxia  - Appreciate  nephrology consultants, planning for inpatient HD  - Restrict fluids, renally-dose medications, continue cyclosporine  and mycophenolate    3. Anemia; PUD  - Hgb appears stable, no apparent bleeding  - Continue PPI twice daily   4. Insulin-dependent DM  - Continue CBG checks and insulin   5. Atrial fibrillation  - In rate-controlled a fib on admission  - Recently taking off of rate-control agents d/t low BP  - Continue warfarin    6. Urinary obstruction  - Following with urology and planned for upcoming voiding trial  - Continue Flomax and finasteride, Foley care    7. Dysphagia  - Pt requiring honey thick liquids and medications in apple sauce    DVT prophylaxis: warfarin  Code Status: Full, confirmed on admission  Level of Care: Level of care: Stepdown Family Communication: wife updated at bedside Disposition Plan:  Patient is from: Home  Anticipated d/c is to: TBD Anticipated d/c date is: 07/20/21 Patient currently: pending volume removal with HD, improved respiratory status  Consults called: nephrology  Admission status: Inpatient     Vianne Bulls, MD [facility]  07/17/2021, 9:52 PM

## 2021-07-17 NOTE — ED Triage Notes (Signed)
Pt arrived via EMS. States  pt was at dialysis, pt hypotensive upon arrival, became hard to arouse, on oxygen per patient. Sats at 70% on 4L. Last BP 108/77. Afib on monitor. Being treated for ESKD. Currently on 10 L oxygen. EMS states pt has current UTI.

## 2021-07-17 NOTE — ED Notes (Signed)
Date and time results received: 07/17/21 0726   Test: Lactic Critical Value: 2.1  Name of Provider Notified: Long  Orders Received? Or Actions Taken?: NA

## 2021-07-17 NOTE — Progress Notes (Signed)
ANTICOAGULATION CONSULT NOTE - Initial Consult  Pharmacy Consult for Warfarin Indication: atrial fibrillation  Allergies  Allergen Reactions   Lisinopril Other (See Comments)    Other reaction(s): Other (See Comments) Mouth,facial swelling  Mouth,facial swelling  Mouth,facial swelling      Patient Measurements: Height: 5\' 10"  (177.8 cm) Weight: 122.5 kg (270 lb) IBW/kg (Calculated) : 73   Vital Signs: Temp: 97.8 F (36.6 C) (08/15 1745) Temp Source: Oral (08/15 1745) BP: 143/98 (08/15 2100) Pulse Rate: 100 (08/15 2100)  Labs: Recent Labs    07/17/21 1828  HGB 8.6*  HCT 27.5*  PLT 240  APTT 35  LABPROT 19.0*  INR 1.6*  CREATININE 3.18*    Estimated Creatinine Clearance: 23.9 mL/min (A) (by C-G formula based on SCr of 3.18 mg/dL (H)).   Medical History: Past Medical History:  Diagnosis Date   Acute ischemic vertebrobasilar artery thalamic stroke ([place]) 2015   Atrial fibrillation (HCC)    On Coumadin   Basal cell carcinoma 01/08/2017   right forearm ( txpbx)   COPD (chronic obstructive pulmonary disease) (HCC)    Deceased-donor kidney transplant 2015   Diabetic nephropathy (HCC)    End stage renal disease (HCC)    FSGS (focal segmental glomerulosclerosis)    HLD (hyperlipidemia)    HTN (hypertension)    Long-term use of immunosuppressant medication    SCC (squamous cell carcinoma) 05/10/2015   right side of face (cx38fu)   SCC (squamous cell carcinoma) 04/25/2016   right post scalp ( cx71fu)   SCC (squamous cell carcinoma) 08/27/2017   right post scalp (txpbx) insitu   SCC (squamous cell carcinoma) 05/05/2018   back of scalp (mohs) insitu   SCC (squamous cell carcinoma) 10/21/2018   top of right hand (txpbx) well diff   SCC (squamous cell carcinoma) 10/21/2018   back of crown (txpbx) insitu   SCC (squamous cell carcinoma) 06/18/2019   back of crown ( cx27fu) insitu   Squamous cell carcinoma of skin 05/10/2015   right crown insitu (cx79fu)   Type  2 diabetes mellitus (HCC)     Assessment: 80 yo M on warfarin for afib in the setting of ESRD on dialysis and hx CVA. Noted recent upper GIB due to duodenal ulcer in the setting of INR 5.87. Apixaban was attempted 06/08/21 but stopped due to progressive anemia and melena resulting in hemorrhagic shock. A total of 9 units RBC were given during this July 2022 admission at [facility]. [name] INR 1.6 on admission.    PTA warfarin dose 3mg  every other day alternating with 6mg . Last dose was 3mg  on 8/14. (Has 6mg  tablets)   Goal of Therapy:  INR 2-3 >> ideally 2- 2.5 given recent severe bleed and duodenal ulcer Monitor platelets by anticoagulation protocol: Yes   Plan:  Warfarin 6mg  x1 per home dose Monitor daily INR, CBC/plt Monitor for signs/symptoms of bleeding    Benetta Spar, PharmD, BCPS, BCCP Clinical Pharmacist  Please check AMION for all [place] phone numbers After 10:00 PM, call [place]

## 2021-07-17 NOTE — ED Provider Notes (Signed)
Emergency Department Provider Note   I have reviewed the triage vital signs and the nursing notes.   HISTORY  Chief Complaint Shortness of Breath   HPI Kevin Frey is a 80 y.o. male with PMH reviewed below including urinary retention with foley and ESRD recently starting HD presents to the ED from HD with hypotension and AMS. Staff at HD took an initial BP prior to starting HD and found it in the 70s. He seemed drowsy. EMS arrived with similar presentation. No IVF given. Patient found to be profoundly hypoxemic as well initially in the 70s on RA and improved with 10L [place] O2. Patient more awake en route. Patient describing some non-specific weakness today but denies pain, fever, chills. EMS reports that patient is on abx for a UTI but patient is unsure which abx or treatment course to date. Denies feeling particularly SOB.    Past Medical History:  Diagnosis Date   Acute ischemic vertebrobasilar artery thalamic stroke ([place]) 2015   Atrial fibrillation (HCC)    On Coumadin   Basal cell carcinoma 01/08/2017   right forearm ( txpbx)   COPD (chronic obstructive pulmonary disease) ([place])    Deceased-donor kidney transplant 2015   Diabetic nephropathy (HCC)    End stage renal disease (HCC)    FSGS (focal segmental glomerulosclerosis)    HLD (hyperlipidemia)    HTN (hypertension)    Lillyen Schow-term use of immunosuppressant medication    SCC (squamous cell carcinoma) 05/10/2015   right side of face (cx10fu)   SCC (squamous cell carcinoma) 04/25/2016   right post scalp ( cx28fu)   SCC (squamous cell carcinoma) 08/27/2017   right post scalp (txpbx) insitu   SCC (squamous cell carcinoma) 05/05/2018   back of scalp (mohs) insitu   SCC (squamous cell carcinoma) 10/21/2018   top of right hand (txpbx) well diff   SCC (squamous cell carcinoma) 10/21/2018   back of crown (txpbx) insitu   SCC (squamous cell carcinoma) 06/18/2019   back of crown ( cx26fu) insitu   Squamous cell carcinoma of  skin 05/10/2015   right crown insitu (cx63fu)   Type 2 diabetes mellitus ([place])     Patient Active Problem List   Diagnosis Date Noted   Acute pulmonary edema ([place]) 07/17/2021   Acute on chronic respiratory failure with hypoxia ([place]) 07/17/2021   Unspecified atrial fibrillation ([place]) 07/17/2021   PUD (peptic ulcer disease) 07/17/2021   Diabetes mellitus, type II, insulin dependent ([place]) 07/17/2021   Renal transplant recipient 07/17/2021   Prolonged QT interval 07/17/2021   Pressure sore 07/17/2021   Lower urinary tract obstruction 07/10/2021   Urinary retention 07/10/2021   Heme positive stool 03/15/2020   Anemia 03/15/2020   Dysphagia 03/15/2020    Past Surgical History:  Procedure Laterality Date   COLONOSCOPY     Deceased donor kidney transplant  2015    Allergies Lisinopril  Family History  Problem Relation Age of Onset   Colon cancer Neg Hx    Stomach cancer Neg Hx     Social History Social History   Tobacco Use   Smoking status: Former    Types: Cigarettes   Smokeless tobacco: Never  Substance Use Topics   Alcohol use: Yes    Comment: occas glass of wine   Drug use: Never    Review of Systems  Constitutional: No fever/chills. Positive generalized weakness.  Eyes: No visual changes. ENT: No sore throat. Cardiovascular: Denies chest pain. Low BP at HD.  Respiratory: Denies shortness of  breath. Positive hypoxemia at HD.  Gastrointestinal: No abdominal pain.  No nausea, no vomiting.  No diarrhea.  No constipation. Genitourinary: Negative for dysuria. Musculoskeletal: Negative for back pain. Skin: Negative for rash. Neurological: Negative for headaches, focal weakness or numbness.  10-point ROS otherwise negative.  ____________________________________________   PHYSICAL EXAM:  VITAL SIGNS: ED Triage Vitals  Enc Vitals Group     BP 07/17/21 1735 108/78     Pulse Rate 07/17/21 1735 92     Resp 07/17/21 1735 (!) 23     Temp 07/17/21 1735 97.9  F (36.6 C)     Temp Source 07/17/21 1735 Oral     SpO2 07/17/21 1735 (!) 83 %     Weight 07/17/21 1740 270 lb (122.5 kg)     Height 07/17/21 1740 5\' 10"  (1.778 m)   Constitutional: Alert and oriented. Well appearing and in no acute distress. Patient is talkative. Slightly HOH.  Eyes: Conjunctivae are normal. Head: Atraumatic. Nose: No congestion/rhinnorhea. Mouth/Throat: Mucous membranes are moist.  Oropharynx non-erythematous. Neck: No stridor.   Cardiovascular: Normal rate, regular rhythm. Good peripheral circulation. Grossly normal heart sounds.   Respiratory: Normal respiratory effort.  No retractions. Lungs CTAB. Gastrointestinal: Soft and nontender. No distention.  Musculoskeletal: No lower extremity tenderness with 1+ pitting edema bilaterally. No gross deformities of extremities. Neurologic:  Normal speech and language. No gross focal neurologic deficits are appreciated.  Skin:  Skin is warm, dry and intact. No rash noted.   ____________________________________________   LABS (all labs ordered are listed, but only abnormal results are displayed)  Labs Reviewed  URINE CULTURE - Abnormal; Notable for the following components:      Result Value   Culture MULTIPLE SPECIES PRESENT, SUGGEST RECOLLECTION (*)    All other components within normal limits  LACTIC ACID, PLASMA - Abnormal; Notable for the following components:   Lactic Acid, Venous 2.1 (*)    All other components within normal limits  COMPREHENSIVE METABOLIC PANEL - Abnormal; Notable for the following components:   Sodium 133 (*)    Chloride 97 (*)    Glucose, Bld 214 (*)    BUN 37 (*)    Creatinine, Ser 3.18 (*)    Calcium 8.1 (*)    Total Protein 6.0 (*)    Albumin 2.9 (*)    Total Bilirubin 1.3 (*)    GFR, Estimated 19 (*)    All other components within normal limits  CBC WITH DIFFERENTIAL/PLATELET - Abnormal; Notable for the following components:   RBC 2.90 (*)    Hemoglobin 8.6 (*)    HCT 27.5 (*)     RDW 16.2 (*)    Neutro Abs 8.4 (*)    Lymphs Abs 0.3 (*)    All other components within normal limits  PROTIME-INR - Abnormal; Notable for the following components:   Prothrombin Time 19.0 (*)    INR 1.6 (*)    All other components within normal limits  URINALYSIS, ROUTINE W REFLEX MICROSCOPIC - Abnormal; Notable for the following components:   APPearance TURBID (*)    Hgb urine dipstick MODERATE (*)    Protein, ur 100 (*)    Leukocytes,Ua MODERATE (*)    RBC / HPF >50 (*)    WBC, UA >50 (*)    Bacteria, UA MANY (*)    All other components within normal limits  BRAIN NATRIURETIC PEPTIDE - Abnormal; Notable for the following components:   B Natriuretic Peptide 1,278.0 (*)    All other  components within normal limits  BASIC METABOLIC PANEL - Abnormal; Notable for the following components:   Chloride 95 (*)    Glucose, Bld 147 (*)    BUN 46 (*)    Creatinine, Ser 3.68 (*)    Calcium 8.4 (*)    GFR, Estimated 16 (*)    All other components within normal limits  CBC - Abnormal; Notable for the following components:   WBC 10.8 (*)    RBC 2.84 (*)    Hemoglobin 8.2 (*)    HCT 26.6 (*)    RDW 16.3 (*)    All other components within normal limits  PROTIME-INR - Abnormal; Notable for the following components:   Prothrombin Time 18.6 (*)    INR 1.6 (*)    All other components within normal limits  GLUCOSE, CAPILLARY - Abnormal; Notable for the following components:   Glucose-Capillary 117 (*)    All other components within normal limits  GLUCOSE, CAPILLARY - Abnormal; Notable for the following components:   Glucose-Capillary 119 (*)    All other components within normal limits  GLUCOSE, CAPILLARY - Abnormal; Notable for the following components:   Glucose-Capillary 113 (*)    All other components within normal limits  CBC - Abnormal; Notable for the following components:   RBC 2.87 (*)    Hemoglobin 8.1 (*)    HCT 27.1 (*)    MCHC 29.9 (*)    RDW 16.1 (*)    All other  components within normal limits  PROTIME-INR - Abnormal; Notable for the following components:   Prothrombin Time 17.6 (*)    INR 1.5 (*)    All other components within normal limits  RENAL FUNCTION PANEL - Abnormal; Notable for the following components:   BUN 33 (*)    Creatinine, Ser 3.14 (*)    Calcium 8.5 (*)    Phosphorus 4.9 (*)    Albumin 2.8 (*)    GFR, Estimated 19 (*)    All other components within normal limits  GLUCOSE, CAPILLARY - Abnormal; Notable for the following components:   Glucose-Capillary 174 (*)    All other components within normal limits  GLUCOSE, CAPILLARY - Abnormal; Notable for the following components:   Glucose-Capillary 59 (*)    All other components within normal limits  GLUCOSE, CAPILLARY - Abnormal; Notable for the following components:   Glucose-Capillary 169 (*)    All other components within normal limits  GLUCOSE, CAPILLARY - Abnormal; Notable for the following components:   Glucose-Capillary 164 (*)    All other components within normal limits  GLUCOSE, CAPILLARY - Abnormal; Notable for the following components:   Glucose-Capillary 170 (*)    All other components within normal limits  RESP PANEL BY RT-PCR (FLU A&B, COVID) ARPGX2  CULTURE, BLOOD (ROUTINE X 2)  CULTURE, BLOOD (ROUTINE X 2)  MRSA NEXT GEN BY PCR, NASAL  LACTIC ACID, PLASMA  APTT  HEMOGLOBIN A1C  MAGNESIUM  HEPATITIS B SURFACE ANTIGEN  MAGNESIUM  GLUCOSE, CAPILLARY  CBC  PROTIME-INR  RENAL FUNCTION PANEL   ____________________________________________  EKG   EKG Interpretation  Date/Time:  Monday July 17 2021 17:39:18 EDT Ventricular Rate:  96 PR Interval:    QRS Duration: 102 QT Interval:  422 QTC Calculation: 534 R Axis:   -7 Text Interpretation: Atrial fibrillation Anteroseptal infarct, age indeterminate Prolonged QT interval No old tracing for comparison Confirmed by Nanda Quinton [phone]) on 07/17/2021 6:06:30 PM         ____________________________________________  RADIOLOGY  CXR reviewed.   ____________________________________________   PROCEDURES  Procedure(s) performed:   Procedures  CRITICAL CARE Performed by: Margette Fast Total critical care time: 35 minutes Critical care time was exclusive of separately billable procedures and treating other patients. Critical care was necessary to treat or prevent imminent or life-threatening deterioration. Critical care was time spent personally by me on the following activities: development of treatment plan with patient and/or surrogate as well as nursing, discussions with consultants, evaluation of patient's response to treatment, examination of patient, obtaining history from patient or surrogate, ordering and performing treatments and interventions, ordering and review of laboratory studies, ordering and review of radiographic studies, pulse oximetry and re-evaluation of patient's condition.  Nanda Quinton, MD Emergency Medicine  ____________________________________________   INITIAL IMPRESSION / ASSESSMENT AND PLAN / ED COURSE  Pertinent labs & imaging results that were available during my care of the patient were reviewed by me and considered in my medical decision making (see chart for details).   Patient arrives to the ED with hypoxemia and hypotension prior to HD today. Vitals improved en route in terms of BP but remains hypoxemic. Some edema in the LEs notes with crackles at the bases. Sepsis is also a consideration with report of UTI treatment recently. Plan for gentle IVF bolus pending w/u. Tolerating [place] O2 well.   Spoke with Nephrology regarding pulmonary edema and hypoxemia. Plan for HD in the AM. Patient looking well on O2. No hypotension here. Doubt sepsis clinically.   Discussed patient's case with TRH to request admission. Patient and family (if present) updated with plan. Care transferred to [facility] service.  I reviewed all nursing  notes, vitals, pertinent old records, EKGs, labs, imaging (as available).  ____________________________________________  FINAL CLINICAL IMPRESSION(S) / ED DIAGNOSES  Final diagnoses:  Acute respiratory failure with hypoxia ([place])     MEDICATIONS GIVEN DURING THIS VISIT:  Medications  Chlorhexidine Gluconate Cloth 2 % PADS 6 each (6 each Topical Given 07/19/21 0000)  pentafluoroprop-tetrafluoroeth (GEBAUERS) aerosol 1 application (has no administration in time range)  lidocaine (PF) (XYLOCAINE) 1 % injection 5 mL (has no administration in time range)  lidocaine-prilocaine (EMLA) cream 1 application (has no administration in time range)  0.9 %  sodium chloride infusion (has no administration in time range)  0.9 %  sodium chloride infusion (has no administration in time range)  heparin injection 1,000 Units (1,000 Units Dialysis Given 07/18/21 1315)  alteplase (CATHFLO ACTIVASE) injection 2 mg (has no administration in time range)  pantoprazole (PROTONIX) EC tablet 40 mg (40 mg Oral Given 07/19/21 2229)  tamsulosin (FLOMAX) capsule 0.4 mg (0.4 mg Oral Given 07/19/21 2229)  cycloSPORINE modified (NEORAL) capsule 100 mg (100 mg Oral Given 07/19/21 2230)  diclofenac Sodium (VOLTAREN) 1 % topical gel 2 g (has no administration in time range)  insulin aspart (novoLOG) injection 0-6 Units (1 Units Subcutaneous Given 07/19/21 1710)  insulin glargine-yfgn (SEMGLEE) injection 4 Units (4 Units Subcutaneous Given 07/19/21 2229)  acetaminophen (TYLENOL) tablet 650 mg (has no administration in time range)    Or  acetaminophen (TYLENOL) suppository 650 mg (has no administration in time range)  finasteride (PROSCAR) tablet 5 mg (5 mg Oral Given 07/19/21 1015)  Warfarin - Pharmacist Dosing Inpatient ( Does not apply Given 07/19/21 1646)  cefTRIAXone (ROCEPHIN) 1 g in sodium chloride 0.9 % 100 mL IVPB (1 g Intravenous New Bag/Given 07/19/21 1000)  mycophenolate (MYFORTIC) EC tablet 180 mg (180 mg Oral Given  07/19/21 2230)  Darbepoetin Alfa (  ARANESP) injection 150 mcg (has no administration in time range)  Chlorhexidine Gluconate Cloth 2 % PADS 6 each (6 each Topical Given 07/19/21 1011)  prochlorperazine (COMPAZINE) injection 10 mg (has no administration in time range)  midodrine (PROAMATINE) tablet 5 mg (5 mg Oral Given 07/19/21 1711)  carvedilol (COREG) tablet 12.5 mg (12.5 mg Oral Given 07/19/21 1709)  sodium chloride 0.9 % bolus 500 mL (0 mLs Intravenous Stopped 07/17/21 1921)  warfarin (COUMADIN) tablet 6 mg (6 mg Oral Given 07/18/21 0137)  warfarin (COUMADIN) tablet 3 mg (3 mg Oral Given 07/18/21 1700)  albumin human 25 % solution (25 g  New Bag/Given 07/18/21 1040)  warfarin (COUMADIN) tablet 6 mg (6 mg Oral Given 07/19/21 1646)    Note:  This document was prepared using Dragon voice recognition software and may include unintentional dictation errors.  Nanda Quinton, MD, [facility] Emergency Medicine    Viridiana Spaid, [place], MD 07/20/21 [phone]

## 2021-07-17 NOTE — ED Notes (Signed)
Wife in room requesting that Foley be removed. MD notified and states that he will speak with wife.

## 2021-07-18 ENCOUNTER — Ambulatory Visit: Payer: Medicare Other

## 2021-07-18 LAB — URINALYSIS, ROUTINE W REFLEX MICROSCOPIC
Bilirubin Urine: NEGATIVE
Glucose, UA: NEGATIVE mg/dL
Ketones, ur: NEGATIVE mg/dL
Nitrite: NEGATIVE
Protein, ur: 100 mg/dL — AB
RBC / HPF: >50 RBC/hpf — ABNORMAL HIGH (ref 0–5)
Specific Gravity, Urine: 1.008 (ref 1.005–1.030)
WBC, UA: >50 WBC/hpf — ABNORMAL HIGH (ref 0–5)
pH: 5 (ref 5.0–8.0)

## 2021-07-18 LAB — HEPATITIS B SURFACE ANTIGEN: Hepatitis B Surface Ag: NONREACTIVE

## 2021-07-18 LAB — CBC
HCT: 26.6 % — ABNORMAL LOW (ref 39.0–52.0)
Hemoglobin: 8.2 g/dL — ABNORMAL LOW (ref 13.0–17.0)
MCH: 28.9 pg (ref 26.0–34.0)
MCHC: 30.8 g/dL (ref 30.0–36.0)
MCV: 93.7 fL (ref 80.0–100.0)
Platelets: 240 10*3/uL (ref 150–400)
RBC: 2.84 MIL/uL — ABNORMAL LOW (ref 4.22–5.81)
RDW: 16.3 % — ABNORMAL HIGH (ref 11.5–15.5)
WBC: 10.8 10*3/uL — ABNORMAL HIGH (ref 4.0–10.5)
nRBC: 0 % (ref 0.0–0.2)

## 2021-07-18 LAB — BASIC METABOLIC PANEL
Anion gap: 13 (ref 5–15)
BUN: 46 mg/dL — ABNORMAL HIGH (ref 8–23)
CO2: 27 mmol/L (ref 22–32)
Calcium: 8.4 mg/dL — ABNORMAL LOW (ref 8.9–10.3)
Chloride: 95 mmol/L — ABNORMAL LOW (ref 98–111)
Creatinine, Ser: 3.68 mg/dL — ABNORMAL HIGH (ref 0.61–1.24)
GFR, Estimated: 16 mL/min — ABNORMAL LOW (ref >60)
Glucose, Bld: 147 mg/dL — ABNORMAL HIGH (ref 70–99)
Potassium: 4.2 mmol/L (ref 3.5–5.1)
Sodium: 135 mmol/L (ref 135–145)

## 2021-07-18 LAB — GLUCOSE, CAPILLARY
Glucose-Capillary: 117 mg/dL — ABNORMAL HIGH (ref 70–99)
Glucose-Capillary: 119 mg/dL — ABNORMAL HIGH (ref 70–99)
Glucose-Capillary: 113 mg/dL — ABNORMAL HIGH (ref 70–99)
Glucose-Capillary: 174 mg/dL — ABNORMAL HIGH (ref 70–99)

## 2021-07-18 LAB — HEMOGLOBIN A1C
Hgb A1c MFr Bld: 4.9 % (ref 4.8–5.6)
Mean Plasma Glucose: 93.93 mg/dL

## 2021-07-18 LAB — MAGNESIUM: Magnesium: 1.7 mg/dL (ref 1.7–2.4)

## 2021-07-18 LAB — PROTIME-INR
INR: 1.6 — ABNORMAL HIGH (ref 0.8–1.2)
Prothrombin Time: 18.6 seconds — ABNORMAL HIGH (ref 11.4–15.2)

## 2021-07-18 LAB — MRSA NEXT GEN BY PCR, NASAL: MRSA by PCR Next Gen: NOT DETECTED

## 2021-07-18 MED ORDER — ALBUMIN HUMAN 25 % IV SOLN
INTRAVENOUS | Status: AC
  Administered 2021-07-18: 25 g
  Filled 2021-07-18: qty 200

## 2021-07-18 MED ORDER — HEPARIN SODIUM (PORCINE) 1000 UNIT/ML IJ SOLN
INTRAMUSCULAR | Status: AC
  Administered 2021-07-18: 1000 [IU] via INTRAVENOUS_CENTRAL
  Filled 2021-07-18: qty 4

## 2021-07-18 MED ORDER — MYCOPHENOLATE SODIUM 180 MG PO TBEC
180.0000 mg | DELAYED_RELEASE_TABLET | Freq: Two times a day (BID) | ORAL | Status: DC
  Administered 2021-07-19 – 2021-07-21 (×5): 180 mg via ORAL
  Filled 2021-07-18 (×11): qty 1

## 2021-07-18 MED ORDER — SODIUM CHLORIDE 0.9 % IV SOLN
1.0000 g | INTRAVENOUS | Status: DC
  Administered 2021-07-18 – 2021-07-21 (×4): 1 g via INTRAVENOUS
  Filled 2021-07-18 (×4): qty 10

## 2021-07-18 MED ORDER — MIDODRINE HCL 5 MG PO TABS
10.0000 mg | ORAL_TABLET | Freq: Three times a day (TID) | ORAL | Status: DC
  Administered 2021-07-18: 10 mg via ORAL
  Filled 2021-07-18 (×3): qty 2

## 2021-07-18 MED ORDER — WARFARIN SODIUM 2 MG PO TABS
3.0000 mg | ORAL_TABLET | Freq: Once | ORAL | Status: AC
  Administered 2021-07-18: 3 mg via ORAL
  Filled 2021-07-18: qty 1

## 2021-07-18 NOTE — ED Notes (Signed)
Dialysis access- [facility] placed in LDA. Information entered based off of CareEverywhere documentation

## 2021-07-18 NOTE — TOC Initial Note (Signed)
Transition of Care [facility]) - Initial/Assessment Note    Patient Details  Name: Kevin Frey MRN: [id number] Date of Birth: 27-Aug-1941  [facility] CM/SW Contact:    Iona Beard, [place] Phone Number: 07/18/2021, 11:01 AM  Clinical Narrative:                 Pt is high risk for readmission. CSW spoke in room with pt and pts wife to complete assessment. Pt and his wife live together. Pt is mostly independent but does have assistance from his wife with some ADLs. Pt does not drive but his wife is able to provide transportation. Pt is currently active with [facility] for [facility] PT. Pt has a walker, wheelchair, stair lift, walk in tub. Pts wife is requesting a rollator, CSW to inquire about getting pt new rollator. CSW did explain that insurance may not cover a new one and they may have to pay out of pocket retail value. CSW to let pts wife know when there is an update. TOC to follow.  Expected Discharge Plan: [place] Barriers to Discharge: Continued Medical Work up   Patient Goals and CMS Choice Patient states their goals for this hospitalization and ongoing recovery are:: Return home with [facility] CMS Medicare.gov Compare Post Acute Care list provided to:: Patient Choice offered to / list presented to : Patient, Spouse  Expected Discharge Plan and Services Expected Discharge Plan: [place] In-house Referral: Clinical Social Work Discharge Planning Services: CM Consult Post Acute Care Choice: [place] arrangements for the past 2 months: Single Family Home                                      Prior Living Arrangements/Services Living arrangements for the past 2 months: Single Family Home Lives with:: Spouse Patient language and need for interpreter reviewed:: Yes Do you feel safe going back to the place where you live?: Yes      Need for Family Participation in Patient Care: Yes (Comment) Care giver support system in  place?: Yes (comment) Current home services: DME, Home PT Criminal Activity/Legal Involvement Pertinent to Current Situation/Hospitalization: No - Comment as needed  Activities of Daily Living Home Assistive Devices/Equipment: Environmental consultant (specify type) (Four wheel walker) ADL Screening (condition at time of admission) Patient's cognitive ability adequate to safely complete daily activities?: Yes Is the patient deaf or have difficulty hearing?: Yes Does the patient have difficulty seeing, even when wearing glasses/contacts?: No Does the patient have difficulty concentrating, remembering, or making decisions?: No Patient able to express need for assistance with ADLs?: Yes Does the patient have difficulty dressing or bathing?: Yes Independently performs ADLs?: No Communication: Independent Dressing (OT): Needs assistance Is this a change from baseline?: Pre-admission baseline Grooming: Needs assistance Is this a change from baseline?: Pre-admission baseline Feeding: Independent Bathing: Needs assistance Is this a change from baseline?: Pre-admission baseline Toileting: Needs assistance Is this a change from baseline?: Pre-admission baseline In/Out Bed: Needs assistance Is this a change from baseline?: Pre-admission baseline Walks in Home: Dependent Is this a change from baseline?: Pre-admission baseline Does the patient have difficulty walking or climbing stairs?: Yes Weakness of Legs: None Weakness of Arms/Hands: Both  Permission Sought/Granted                  Emotional Assessment Appearance:: Appears stated age Attitude/Demeanor/Rapport: Engaged Affect (typically observed): Accepting  Orientation: : Oriented to Self, Oriented to Place, Oriented to  Time, Oriented to Situation Alcohol / Substance Use: Not Applicable Psych Involvement: No (comment)  Admission diagnosis:  Acute pulmonary edema (HCC) [J81.0] Acute respiratory failure with hypoxia (HCC) [J96.01] Patient Active  Problem List   Diagnosis Date Noted   Acute pulmonary edema ([place]) 07/17/2021   Acute on chronic respiratory failure with hypoxia ([place]) 07/17/2021   Unspecified atrial fibrillation ([place]) 07/17/2021   PUD (peptic ulcer disease) 07/17/2021   Diabetes mellitus, type II, insulin dependent ([place]) 07/17/2021   Renal transplant recipient 07/17/2021   Prolonged QT interval 07/17/2021   Pressure sore 07/17/2021   Lower urinary tract obstruction 07/10/2021   Urinary retention 07/10/2021   Heme positive stool 03/15/2020   Anemia 03/15/2020   Dysphagia 03/15/2020   PCP:  Caryl Bis, MD Pharmacy:   [facility] [address], [place] [address] Phone: [phone] Fax: [phone]     Social Determinants of Health (SDOH) Interventions    Readmission Risk Interventions Readmission Risk Prevention Plan 07/18/2021  Transportation Screening Complete  HRI or [place] Complete  Social Work Consult for [place] Planning/Counseling Complete  Palliative Care Screening Not Applicable  Medication Review Press photographer) Complete  Some recent data might be hidden

## 2021-07-18 NOTE — Consult Note (Signed)
Reason for Consult: To manage dialysis and dialysis related needs Referring Physician: [name] is an 80 y.o. male HTN, Afib on coumadin, COPD, ESRD s/p DDKT in 2015 with recent graft failure and re institution of dialysis on July 8 -  discharged and now on dialysis at  [facility]-  designated as "acute"-  staff describes as frail.  He underwent dialysis there yesterday -  it was complicated by hypotension and lethargy.  This is on a background of probably becoming more volume overloaded but was not challenged at OP unit becase BP has been low and because he is an acute designation.  In ER last night-  he was afebrile but hypoxic-  relatively high dose O2 required.  CXR looks like edema with effusions.  WBC not elevated-  urine looks purulent (also history of indwelling foley since 6.29-  failing voiding trials-  was due for another today by Dr. [facility].  Today will be attempting HD with measures to keep BP up and get fluid off-  blood and urine cultures pending-  has been started on roephin.  His cyclosporine and myfortic have been continued.  This AM he is alert-  no real c/o's   Dialyzes at [facility]   MWF  EDW 80.5. 4 hours HD Bath 2/2.5, Dialyzer Rev 300, Heparin none. Access TDC-  300 BFR.  Epo 2400 q tx and venofer 50 weekly-  have not been challenging because is acute  Past Medical History:  Diagnosis Date   Acute ischemic vertebrobasilar artery thalamic stroke ([place]) 2015   Atrial fibrillation (HCC)    On Coumadin   Basal cell carcinoma 01/08/2017   right forearm ( txpbx)   COPD (chronic obstructive pulmonary disease) (HCC)    Deceased-donor kidney transplant 2015   Diabetic nephropathy (HCC)    End stage renal disease (HCC)    FSGS (focal segmental glomerulosclerosis)    HLD (hyperlipidemia)    HTN (hypertension)    Long-term use of immunosuppressant medication    SCC (squamous cell carcinoma) 05/10/2015   right side of face (cx82fu)   SCC  (squamous cell carcinoma) 04/25/2016   right post scalp ( cx100fu)   SCC (squamous cell carcinoma) 08/27/2017   right post scalp (txpbx) insitu   SCC (squamous cell carcinoma) 05/05/2018   back of scalp (mohs) insitu   SCC (squamous cell carcinoma) 10/21/2018   top of right hand (txpbx) well diff   SCC (squamous cell carcinoma) 10/21/2018   back of crown (txpbx) insitu   SCC (squamous cell carcinoma) 06/18/2019   back of crown ( cx41fu) insitu   Squamous cell carcinoma of skin 05/10/2015   right crown insitu (cx105fu)   Type 2 diabetes mellitus ([place])     Past Surgical History:  Procedure Laterality Date   COLONOSCOPY     Deceased donor kidney transplant  2015    Family History  Problem Relation Age of Onset   Colon cancer Neg Hx    Stomach cancer Neg Hx     Social History:  reports that he has quit smoking. His smoking use included cigarettes. He has never used smokeless tobacco. He reports current alcohol use. He reports that he does not use drugs.  Allergies:  Allergies  Allergen Reactions   Lisinopril Other (See Comments)    Other reaction(s): Other (See Comments) Mouth,facial swelling  Mouth,facial swelling  Mouth,facial swelling      Medications: I have reviewed the patient's current medications.  Results for orders  placed or performed during the hospital encounter of 07/17/21 (from the past 48 hour(s))  Resp Panel by RT-PCR (Flu A&B, Covid) Nasopharyngeal Swab     Status: None   Collection Time: 07/17/21  5:38 PM   Specimen: Nasopharyngeal Swab; Nasopharyngeal(NP) swabs in vial transport medium  Result Value Ref Range   SARS Coronavirus 2 by RT PCR NEGATIVE NEGATIVE    Comment: (NOTE) SARS-CoV-2 target nucleic acids are NOT DETECTED.  The SARS-CoV-2 RNA is generally detectable in upper respiratory specimens during the acute phase of infection. The lowest concentration of SARS-CoV-2 viral copies this assay can detect is 138 copies/mL. A negative result does  not preclude SARS-Cov-2 infection and should not be used as the sole basis for treatment or other patient management decisions. A negative result may occur with  improper specimen collection/handling, submission of specimen other than nasopharyngeal swab, presence of viral mutation(s) within the areas targeted by this assay, and inadequate number of viral copies(<138 copies/mL). A negative result must be combined with clinical observations, patient history, and epidemiological information. The expected result is Negative.  Fact Sheet for Patients:  EntrepreneurPulse.com.au  Fact Sheet for Healthcare Providers:  IncredibleEmployment.be  This test is no t yet approved or cleared by the [place] FDA and  has been authorized for detection and/or diagnosis of SARS-CoV-2 by FDA under an Emergency Use Authorization (EUA). This EUA will remain  in effect (meaning this test can be used) for the duration of the COVID-19 declaration under Section 564(b)(1) of the Act, 21 U.S.C.section 360bbb-3(b)(1), unless the authorization is terminated  or revoked sooner.       Influenza A by PCR NEGATIVE NEGATIVE   Influenza B by PCR NEGATIVE NEGATIVE    Comment: (NOTE) The Xpert Xpress SARS-CoV-2/FLU/RSV plus assay is intended as an aid in the diagnosis of influenza from Nasopharyngeal swab specimens and should not be used as a sole basis for treatment. Nasal washings and aspirates are unacceptable for Xpert Xpress SARS-CoV-2/FLU/RSV testing.  Fact Sheet for Patients: EntrepreneurPulse.com.au  Fact Sheet for Healthcare Providers: IncredibleEmployment.be  This test is not yet approved or cleared by the [place] FDA and has been authorized for detection and/or diagnosis of SARS-CoV-2 by FDA under an Emergency Use Authorization (EUA). This EUA will remain in effect (meaning this test can be used) for the duration of  the COVID-19 declaration under Section 564(b)(1) of the Act, 21 U.S.C. section 360bbb-3(b)(1), unless the authorization is terminated or revoked.  Performed at [facility], [address] [place], [place] [address]   Lactic acid, plasma     Status: Abnormal   Collection Time: 07/17/21  6:28 PM  Result Value Ref Range   Lactic Acid, Venous 2.1 (HH) 0.5 - 1.9 mmol/L    Comment: CRITICAL RESULT CALLED TO, READ BACK BY AND VERIFIED WITH: THOMPSON,R ON 07/17/21 AT 1925 BY LOY,C Performed at [facility], [address] [place], [place] [address]   Comprehensive metabolic panel     Status: Abnormal   Collection Time: 07/17/21  6:28 PM  Result Value Ref Range   Sodium 133 (L) 135 - 145 mmol/L   Potassium 4.2 3.5 - 5.1 mmol/L   Chloride 97 (L) 98 - 111 mmol/L   CO2 25 22 - 32 mmol/L   Glucose, Bld 214 (H) 70 - 99 mg/dL    Comment: Glucose reference range applies only to samples taken after fasting for at least 8 hours.   BUN 37 (H) 8 - 23 mg/dL   Creatinine,  Ser 3.18 (H) 0.61 - 1.24 mg/dL   Calcium 8.1 (L) 8.9 - 10.3 mg/dL   Total Protein 6.0 (L) 6.5 - 8.1 g/dL   Albumin 2.9 (L) 3.5 - 5.0 g/dL   AST 40 15 - 41 U/L   ALT 20 0 - 44 U/L   Alkaline Phosphatase 73 38 - 126 U/L   Total Bilirubin 1.3 (H) 0.3 - 1.2 mg/dL   GFR, Estimated 19 (L) >60 mL/min    Comment: (NOTE) Calculated using the CKD-EPI Creatinine Equation (2021)    Anion gap 11 5 - 15    Comment: Performed at [facility], [address], [place], [place] [address]  CBC WITH DIFFERENTIAL     Status: Abnormal   Collection Time: 07/17/21  6:28 PM  Result Value Ref Range   WBC 9.2 4.0 - 10.5 K/uL   RBC 2.90 (L) 4.22 - 5.81 MIL/uL   Hemoglobin 8.6 (L) 13.0 - 17.0 g/dL   HCT 27.5 (L) 39.0 - 52.0 %   MCV 94.8 80.0 - 100.0 fL   MCH 29.7 26.0 - 34.0 pg   MCHC 31.3 30.0 - 36.0 g/dL   RDW 16.2 (H) 11.5 - 15.5 %   Platelets 240 150 - 400 K/uL   nRBC 0.0 0.0 - 0.2 %   Neutrophils Relative % 90 %   Neutro Abs 8.4 (H) 1.7 - 7.7  K/uL   Lymphocytes Relative 4 %   Lymphs Abs 0.3 (L) 0.7 - 4.0 K/uL   Monocytes Relative 5 %   Monocytes Absolute 0.4 0.1 - 1.0 K/uL   Eosinophils Relative 0 %   Eosinophils Absolute 0.0 0.0 - 0.5 K/uL   Basophils Relative 0 %   Basophils Absolute 0.0 0.0 - 0.1 K/uL   Immature Granulocytes 1 %   Abs Immature Granulocytes 0.06 0.00 - 0.07 K/uL    Comment: Performed at [facility], [address] [place], [place] [address]  Protime-INR     Status: Abnormal   Collection Time: 07/17/21  6:28 PM  Result Value Ref Range   Prothrombin Time 19.0 (H) 11.4 - 15.2 seconds   INR 1.6 (H) 0.8 - 1.2    Comment: (NOTE) INR goal varies based on device and disease states. Performed at [facility], [address] [place], [place] [address]   APTT     Status: None   Collection Time: 07/17/21  6:28 PM  Result Value Ref Range   aPTT 35 24 - 36 seconds    Comment: Performed at [facility], [address], [place], [place] [address]  Blood Culture (routine x 2)     Status: None (Preliminary result)   Collection Time: 07/17/21  6:28 PM   Specimen: BLOOD RIGHT HAND  Result Value Ref Range   Specimen Description BLOOD RIGHT HAND    Special Requests      BOTTLES DRAWN AEROBIC AND ANAEROBIC Blood Culture adequate volume   Culture      NO GROWTH < 12 HOURS Performed at [facility], [address], [place],  [address]    Report Status PENDING   Blood Culture (routine x 2)     Status: None (Preliminary result)   Collection Time: 07/17/21  6:28 PM   Specimen: BLOOD RIGHT FOREARM  Result Value Ref Range   Specimen Description BLOOD RIGHT FOREARM    Special Requests      BOTTLES DRAWN AEROBIC AND ANAEROBIC Blood Culture adequate volume   Culture      NO GROWTH <  12 HOURS Performed at [facility], [address], [place], [place] [address]    Report Status PENDING   Brain natriuretic peptide     Status: Abnormal   Collection Time: 07/17/21  6:28 PM  Result Value Ref Range   B Natriuretic  Peptide 1,278.0 (H) 0.0 - 100.0 pg/mL    Comment: Performed at [facility], [address] [place], [place] [address]  Lactic acid, plasma     Status: None   Collection Time: 07/17/21  8:16 PM  Result Value Ref Range   Lactic Acid, Venous 1.7 0.5 - 1.9 mmol/L    Comment: Performed at [facility], [address], [place], [place] [address]  MRSA Next Gen by PCR, Nasal     Status: None   Collection Time: 07/18/21  1:21 AM   Specimen: Nasal Mucosa; Nasal Swab  Result Value Ref Range   MRSA by PCR Next Gen NOT DETECTED NOT DETECTED    Comment: (NOTE) The GeneXpert MRSA Assay (FDA approved for NASAL specimens only), is one component of a comprehensive MRSA colonization surveillance program. It is not intended to diagnose MRSA infection nor to guide or monitor treatment for MRSA infections. Test performance is not FDA approved in patients less than 72 years old. Performed at [facility], [address], [place], [place] [address]   Urinalysis, Routine w reflex microscopic Urine, Catheterized     Status: Abnormal   Collection Time: 07/18/21  1:47 AM  Result Value Ref Range   Color, Urine YELLOW YELLOW   APPearance TURBID (A) CLEAR   Specific Gravity, Urine 1.008 1.005 - 1.030   pH 5.0 5.0 - 8.0   Glucose, UA NEGATIVE NEGATIVE mg/dL   Hgb urine dipstick MODERATE (A) NEGATIVE   Bilirubin Urine NEGATIVE NEGATIVE   Ketones, ur NEGATIVE NEGATIVE mg/dL   Protein, ur 100 (A) NEGATIVE mg/dL   Nitrite NEGATIVE NEGATIVE   Leukocytes,Ua MODERATE (A) NEGATIVE   RBC / HPF >50 (H) 0 - 5 RBC/hpf   WBC, UA >50 (H) 0 - 5 WBC/hpf   Bacteria, UA MANY (A) NONE SEEN   WBC Clumps PRESENT     Comment: Performed at [facility], [address] [place], [place] [address]  Basic metabolic panel     Status: Abnormal   Collection Time: 07/18/21  4:40 AM  Result Value Ref Range   Sodium 135 135 - 145 mmol/L   Potassium 4.2 3.5 - 5.1 mmol/L   Chloride 95 (L) 98 - 111 mmol/L   CO2 27 22 - 32 mmol/L    Glucose, Bld 147 (H) 70 - 99 mg/dL    Comment: Glucose reference range applies only to samples taken after fasting for at least 8 hours.   BUN 46 (H) 8 - 23 mg/dL   Creatinine, Ser 3.68 (H) 0.61 - 1.24 mg/dL   Calcium 8.4 (L) 8.9 - 10.3 mg/dL   GFR, Estimated 16 (L) >60 mL/min    Comment: (NOTE) Calculated using the CKD-EPI Creatinine Equation (2021)    Anion gap 13 5 - 15    Comment: Performed at [facility], [address] [place], [place] [address]  CBC     Status: Abnormal   Collection Time: 07/18/21  4:40 AM  Result Value Ref Range   WBC 10.8 (H) 4.0 - 10.5 K/uL   RBC 2.84 (L) 4.22 - 5.81 MIL/uL   Hemoglobin 8.2 (L) 13.0 - 17.0 g/dL   HCT 26.6 (L) 39.0 - 52.0 %   MCV 93.7  80.0 - 100.0 fL   MCH 28.9 26.0 - 34.0 pg   MCHC 30.8 30.0 - 36.0 g/dL   RDW 16.3 (H) 11.5 - 15.5 %   Platelets 240 150 - 400 K/uL   nRBC 0.0 0.0 - 0.2 %    Comment: Performed at [facility], [address], [place], [place] [address]  Magnesium     Status: None   Collection Time: 07/18/21  4:40 AM  Result Value Ref Range   Magnesium 1.7 1.7 - 2.4 mg/dL    Comment: Performed at [facility], [address] [place], [place] [address]  Protime-INR     Status: Abnormal   Collection Time: 07/18/21  4:40 AM  Result Value Ref Range   Prothrombin Time 18.6 (H) 11.4 - 15.2 seconds   INR 1.6 (H) 0.8 - 1.2    Comment: (NOTE) INR goal varies based on device and disease states. Performed at [facility], [address] [place], [place] [address]   Glucose, capillary     Status: Abnormal   Collection Time: 07/18/21  7:39 AM  Result Value Ref Range   Glucose-Capillary 117 (H) 70 - 99 mg/dL    Comment: Glucose reference range applies only to samples taken after fasting for at least 8 hours.    DG Chest Port 1 View  Result Date: 07/17/2021 CLINICAL DATA:  Questionable sepsis. States pt was at dialysis, pt hypotensive upon arrival, became hard to arouse, on oxygen per patient. Sats at 70% on 4L. Afib on monitor  hx of COPD and HTN, former smoker. EXAM: PORTABLE CHEST 1 VIEW COMPARISON:  Chest x-ray 05/31/2025. FINDINGS: Right chest wall dialysis catheter upper with tip overlying the expected region of superior caval junction. Cardiac silhouette slightly prominent possibly due to portable AP technique and low lung volumes. The heart size and mediastinal contours are within normal limits. Prominence of the hilar vasculature. Aortic calcification. Low lung volumes. No focal consolidation. Increased interstitial markings. Bilateral trace to small volume pleural effusions. No pneumothorax. No acute osseous abnormality. IMPRESSION: Slightly prominent cardiac silhouette. Associated pulmonary edema with bilateral trace to small volume pleural effusions. Superimposed infection/inflammation not excluded. Electronically Signed   By: Iven Finn M.D.   On: 07/17/2021 18:20    ROS: he denies complaint-   Blood pressure 114/74, pulse 81, temperature 98.4 F (36.9 C), temperature source Oral, resp. rate 20, height 5\' 10"  (1.778 m), weight 82.4 kg, SpO2 94 %. General appearance: alert and fatigued Resp: diminished breath sounds bilaterally Cardio: irregularly irregular rhythm GI: soft, non-tender; bowel sounds normal; no masses,  no organomegaly Male genitalia: normal, indwelling foley-  purulent urine Extremities: edema pitting to dep areas Right sided TDC-  clean and dry-  non tender  Assessment/Plan: 80 year old WM with many medical issues including new ESRD after transplant failure-  presents with sepsis type syndrome and volume overload 1 sepsis-  with elevated lactate-  WBC and soft BP-  sources include the TDC vs indwelling foley and UTI- urine and blood cultures are pending-  on rocephin -  clinically looks a little better this AM-  I have taken myfortic down to half dose given clinical scenario 2 ESRD: at [facility] on MWF-  is acute designation-  appraoaching 5 weeks of being HD dep-  I am not that optimistic  regarding possible recovery.  HD today due to hypoxia and volume overload-  using albumin and midodrine to remove volume.  Timing of next treatment not clear at this time  3 Hypertension: BP soft, reportedly on norvasc, coreg, verapamil, cozaar  and hydralazine ?  as OP, also rapaflo and flomax-  all on hold now except flomax-  challenge EDW as able-  started midodrine 4. Anemia of ESRD: will continue with ESA - hold iron given scenario 5. Metabolic Bone Disease: reportedly on no meds for this as OP-  will check calc and phos and act as needed  6.  Urinary retention-  urine looks purulent - on rocephin.  Can foley be changed here ??  Is established with urology had appt today actually  7. S/p renal transplant -  remains on IS meds-  will dec myfortic as above   Louis Meckel 07/18/2021, 9:23 AM

## 2021-07-18 NOTE — Telephone Encounter (Signed)
Patient wife called and made aware. Wife states that patient is currently in the hospital due to fluid on his lungs at [facility]. Dr. Gloriann Loan is suppose f/u with patient today.

## 2021-07-18 NOTE — Progress Notes (Signed)
ANTICOAGULATION CONSULT NOTE -   Pharmacy Consult for Warfarin Indication: atrial fibrillation  Allergies  Allergen Reactions   Lisinopril Other (See Comments)    Other reaction(s): Other (See Comments) Mouth,facial swelling  Mouth,facial swelling  Mouth,facial swelling      Patient Measurements: Height: 5\' 10"  (177.8 cm) Weight: 82.4 kg (181 lb 10.5 oz) IBW/kg (Calculated) : 73   Vital Signs: BP: 147/79 (08/16 0500) Pulse Rate: 105 (08/16 0500)  Labs: Recent Labs    07/17/21 1828 07/18/21 0440  HGB 8.6* 8.2*  HCT 27.5* 26.6*  PLT 240 240  APTT 35  --   LABPROT 19.0* 18.6*  INR 1.6* 1.6*  CREATININE 3.18* 3.68*     Estimated Creatinine Clearance: 16.3 mL/min (A) (by C-G formula based on SCr of 3.68 mg/dL (H)).   Medical History: Past Medical History:  Diagnosis Date   Acute ischemic vertebrobasilar artery thalamic stroke ([place]) 2015   Atrial fibrillation (HCC)    On Coumadin   Basal cell carcinoma 01/08/2017   right forearm ( txpbx)   COPD (chronic obstructive pulmonary disease) (HCC)    Deceased-donor kidney transplant 2015   Diabetic nephropathy (HCC)    End stage renal disease (HCC)    FSGS (focal segmental glomerulosclerosis)    HLD (hyperlipidemia)    HTN (hypertension)    Long-term use of immunosuppressant medication    SCC (squamous cell carcinoma) 05/10/2015   right side of face (cx41fu)   SCC (squamous cell carcinoma) 04/25/2016   right post scalp ( cx35fu)   SCC (squamous cell carcinoma) 08/27/2017   right post scalp (txpbx) insitu   SCC (squamous cell carcinoma) 05/05/2018   back of scalp (mohs) insitu   SCC (squamous cell carcinoma) 10/21/2018   top of right hand (txpbx) well diff   SCC (squamous cell carcinoma) 10/21/2018   back of crown (txpbx) insitu   SCC (squamous cell carcinoma) 06/18/2019   back of crown ( cx21fu) insitu   Squamous cell carcinoma of skin 05/10/2015   right crown insitu (cx57fu)   Type 2 diabetes mellitus  (HCC)     Assessment: 80 yo M on warfarin for afib in the setting of ESRD on dialysis and hx CVA. Noted recent upper GIB due to duodenal ulcer in the setting of INR 5.8. Apixaban was attempted 06/08/21 but stopped due to progressive anemia and melena resulting in hemorrhagic shock. A total of 9 units RBC were given during this July 2022 admission at [facility]. [name] INR 1.6 on admission.    PTA warfarin dose 3mg  every other day alternating with 6mg . Last dose was 3mg  on 8/14. (Has 6mg  tablets)   Goal of Therapy:  INR 2-3 >> ideally 2- 2.5 given recent severe bleed and duodenal ulcer Monitor platelets by anticoagulation protocol: Yes   Plan:  Warfarin 3 mg x 1 dose Monitor daily INR, CBC/plt Monitor for signs/symptoms of bleeding    Margot Ables, PharmD Clinical Pharmacist 07/18/2021 8:13 AM

## 2021-07-18 NOTE — Progress Notes (Addendum)
PROGRESS NOTE    Kevin Frey  PZW:[id number] DOB: [birth year] DOA: 07/17/2021 PCP: Caryl Bis, MD   Brief Narrative:   Kevin Frey is a 80 y.o. male with medical history significant for atrial fibrillation on warfarin, ESRD with failed renal transplant, insulin-dependent diabetes mellitus, hypertension, now presenting to the emergency department for evaluation of somnolence, hypotension, and hypoxia.  Patient was admitted for acute on chronic hypoxemic respiratory failure in the setting of acute pulmonary edema.  He is also noted to have sepsis in the setting of UTI with urinary obstruction and chronic Foley catheter.  Started on Rocephin today empirically with cultures pending.  Nephrology plans for hemodialysis today.  Assessment & Plan:   Principal Problem:   Acute pulmonary edema (HCC) Active Problems:   Anemia   Dysphagia   Lower urinary tract obstruction   Acute on chronic respiratory failure with hypoxia (HCC)   Unspecified atrial fibrillation (HCC)   PUD (peptic ulcer disease)   Diabetes mellitus, type II, insulin dependent (HCC)   Renal transplant recipient   Prolonged QT interval   Pressure sore   Sepsis secondary to UTI -In the setting of urinary retention-continues on Flomax -Plan to exchange Foley catheter -Started on Rocephin with urine cultures pending -Monitor CBC -Lactic acid has down trended  Acute on chronic hypoxemic respiratory failure in the setting of pulmonary edema -Plans for hemodialysis today -Wears 3L at home at baseline, currently at 5L  ESRD on HD MWF status post renal transplant -HD today per nephrology -Use of midodrine and albumin to assist with blood pressures in removing volume -Myfortic decreased per Nephrology given sepsis as noted above  History of hypertension -Soft blood pressures currently noted, holding blood pressure agents -Started on midodrine per nephrology  Anemia of ESRD -ESA per nephrology  Atrial  fibrillation -Recently off rate control agents due to low BP -Continues on warfarin  History of peptic ulcer disease -Continue PPI twice daily  Insulin-dependent diabetes -Continue insulin as ordered  Dysphagia -Requiring thickened liquids  DVT prophylaxis: Warfarin Code Status: Full Family Communication: Discussed with wife on phone 8/16 Disposition Plan:  Status is: Inpatient  Remains inpatient appropriate because:Ongoing diagnostic testing needed not appropriate for outpatient work up, IV treatments appropriate due to intensity of illness or inability to take PO, and Inpatient level of care appropriate due to severity of illness  Dispo: The patient is from: Home              Anticipated d/c is to: Home              Patient currently is not medically stable to d/c.   Difficult to place patient No   Consultants:  Nephrology  Procedures:  See below  Antimicrobials:  Anti-infectives (From admission, onward)    Start     Dose/Rate Route Frequency Ordered Stop   07/18/21 0845  cefTRIAXone (ROCEPHIN) 1 g in sodium chloride 0.9 % 100 mL IVPB        1 g 200 mL/hr over 30 Minutes Intravenous Every 24 hours 07/18/21 0745        Subjective: Patient seen and evaluated today with no new acute complaints or concerns. No acute concerns or events noted overnight.  Objective: Vitals:   07/18/21 1230 07/18/21 1245 07/18/21 1255 07/18/21 1300  BP: 117/68 101/77 122/67 122/67  Pulse: 83 85 79 92  Resp: 14 15 15 20   Temp:   98.4 F (36.9 C)   TempSrc:   Axillary   SpO2: 99%  100% 100% 95%  Weight:   79.4 kg   Height:        Intake/Output Summary (Last 24 hours) at 07/18/2021 1317 Last data filed at 07/18/2021 1255 Gross per 24 hour  Intake 725 ml  Output 3000 ml  Net -2275 ml   Filed Weights   07/18/21 0500 07/18/21 0950 07/18/21 1255  Weight: 82.4 kg 82.4 kg 79.4 kg    Examination:  General exam: Appears calm and comfortable  Respiratory system: Clear to  auscultation. Respiratory effort normal. Currently on 5L [place]. Cardiovascular system: S1 & S2 heard, RRR.  Gastrointestinal system: Abdomen is soft Central nervous system: Alert and awake Extremities: No edema Skin: No significant lesions noted Psychiatry: Flat affect.    Data Reviewed: I have personally reviewed following labs and imaging studies  CBC: Recent Labs  Lab 07/17/21 1828 07/18/21 0440  WBC 9.2 10.8*  NEUTROABS 8.4*  --   HGB 8.6* 8.2*  HCT 27.5* 26.6*  MCV 94.8 93.7  PLT 240 423   Basic Metabolic Panel: Recent Labs  Lab 07/17/21 1828 07/18/21 0440  NA 133* 135  K 4.2 4.2  CL 97* 95*  CO2 25 27  GLUCOSE 214* 147*  BUN 37* 46*  CREATININE 3.18* 3.68*  CALCIUM 8.1* 8.4*  MG  --  1.7   GFR: Estimated Creatinine Clearance: 16.3 mL/min (A) (by C-G formula based on SCr of 3.68 mg/dL (H)). Liver Function Tests: Recent Labs  Lab 07/17/21 1828  AST 40  ALT 20  ALKPHOS 73  BILITOT 1.3*  PROT 6.0*  ALBUMIN 2.9*   No results for input(s): LIPASE, AMYLASE in the last 168 hours. No results for input(s): AMMONIA in the last 168 hours. Coagulation Profile: Recent Labs  Lab 07/17/21 1828 07/18/21 0440  INR 1.6* 1.6*   Cardiac Enzymes: No results for input(s): CKTOTAL, CKMB, CKMBINDEX, TROPONINI in the last 168 hours. BNP (last 3 results) No results for input(s): PROBNP in the last 8760 hours. HbA1C: Recent Labs    07/18/21 0440  HGBA1C 4.9   CBG: Recent Labs  Lab 07/18/21 0739 07/18/21 1206  GLUCAP 117* 119*   Lipid Profile: No results for input(s): CHOL, HDL, LDLCALC, TRIG, CHOLHDL, LDLDIRECT in the last 72 hours. Thyroid Function Tests: No results for input(s): TSH, T4TOTAL, FREET4, T3FREE, THYROIDAB in the last 72 hours. Anemia Panel: No results for input(s): VITAMINB12, FOLATE, FERRITIN, TIBC, IRON, RETICCTPCT in the last 72 hours. Sepsis Labs: Recent Labs  Lab 07/17/21 1828 07/17/21 2016  LATICACIDVEN 2.1* 1.7    Recent Results  (from the past 240 hour(s))  Resp Panel by RT-PCR (Flu A&B, Covid) Nasopharyngeal Swab     Status: None   Collection Time: 07/17/21  5:38 PM   Specimen: Nasopharyngeal Swab; Nasopharyngeal(NP) swabs in vial transport medium  Result Value Ref Range Status   SARS Coronavirus 2 by RT PCR NEGATIVE NEGATIVE Final    Comment: (NOTE) SARS-CoV-2 target nucleic acids are NOT DETECTED.  The SARS-CoV-2 RNA is generally detectable in upper respiratory specimens during the acute phase of infection. The lowest concentration of SARS-CoV-2 viral copies this assay can detect is 138 copies/mL. A negative result does not preclude SARS-Cov-2 infection and should not be used as the sole basis for treatment or other patient management decisions. A negative result may occur with  improper specimen collection/handling, submission of specimen other than nasopharyngeal swab, presence of viral mutation(s) within the areas targeted by this assay, and inadequate number of viral copies(<138 copies/mL). A negative result  must be combined with clinical observations, patient history, and epidemiological information. The expected result is Negative.  Fact Sheet for Patients:  EntrepreneurPulse.com.au  Fact Sheet for Healthcare Providers:  IncredibleEmployment.be  This test is no t yet approved or cleared by the [place] FDA and  has been authorized for detection and/or diagnosis of SARS-CoV-2 by FDA under an Emergency Use Authorization (EUA). This EUA will remain  in effect (meaning this test can be used) for the duration of the COVID-19 declaration under Section 564(b)(1) of the Act, 21 U.S.C.section 360bbb-3(b)(1), unless the authorization is terminated  or revoked sooner.       Influenza A by PCR NEGATIVE NEGATIVE Final   Influenza B by PCR NEGATIVE NEGATIVE Final    Comment: (NOTE) The Xpert Xpress SARS-CoV-2/FLU/RSV plus assay is intended as an aid in the  diagnosis of influenza from Nasopharyngeal swab specimens and should not be used as a sole basis for treatment. Nasal washings and aspirates are unacceptable for Xpert Xpress SARS-CoV-2/FLU/RSV testing.  Fact Sheet for Patients: EntrepreneurPulse.com.au  Fact Sheet for Healthcare Providers: IncredibleEmployment.be  This test is not yet approved or cleared by the [place] FDA and has been authorized for detection and/or diagnosis of SARS-CoV-2 by FDA under an Emergency Use Authorization (EUA). This EUA will remain in effect (meaning this test can be used) for the duration of the COVID-19 declaration under Section 564(b)(1) of the Act, 21 U.S.C. section 360bbb-3(b)(1), unless the authorization is terminated or revoked.  Performed at [facility], [address], [place], [place] [address]   Blood Culture (routine x 2)     Status: None (Preliminary result)   Collection Time: 07/17/21  6:28 PM   Specimen: BLOOD RIGHT HAND  Result Value Ref Range Status   Specimen Description BLOOD RIGHT HAND  Final   Special Requests   Final    BOTTLES DRAWN AEROBIC AND ANAEROBIC Blood Culture adequate volume   Culture   Final    NO GROWTH < 12 HOURS Performed at [facility], [address] [place], [place] [address]    Report Status PENDING  Incomplete  Blood Culture (routine x 2)     Status: None (Preliminary result)   Collection Time: 07/17/21  6:28 PM   Specimen: BLOOD RIGHT FOREARM  Result Value Ref Range Status   Specimen Description BLOOD RIGHT FOREARM  Final   Special Requests   Final    BOTTLES DRAWN AEROBIC AND ANAEROBIC Blood Culture adequate volume   Culture   Final    NO GROWTH < 12 HOURS Performed at [facility], [address], [place], [place] [address]    Report Status PENDING  Incomplete  MRSA Next Gen by PCR, Nasal     Status: None   Collection Time: 07/18/21  1:21 AM   Specimen: Nasal Mucosa; Nasal Swab  Result Value Ref Range  Status   MRSA by PCR Next Gen NOT DETECTED NOT DETECTED Final    Comment: (NOTE) The GeneXpert MRSA Assay (FDA approved for NASAL specimens only), is one component of a comprehensive MRSA colonization surveillance program. It is not intended to diagnose MRSA infection nor to guide or monitor treatment for MRSA infections. Test performance is not FDA approved in patients less than 77 years old. Performed at [facility], [address], [place], [place] [address]          Radiology Studies: [facility] 1 View  Result Date: 07/17/2021 CLINICAL DATA:  Questionable sepsis. States pt was at dialysis,  pt hypotensive upon arrival, became hard to arouse, on oxygen per patient. Sats at 70% on 4L. Afib on monitor hx of COPD and HTN, former smoker. EXAM: PORTABLE CHEST 1 VIEW COMPARISON:  Chest x-ray 05/31/2025. FINDINGS: Right chest wall dialysis catheter upper with tip overlying the expected region of superior caval junction. Cardiac silhouette slightly prominent possibly due to portable AP technique and low lung volumes. The heart size and mediastinal contours are within normal limits. Prominence of the hilar vasculature. Aortic calcification. Low lung volumes. No focal consolidation. Increased interstitial markings. Bilateral trace to small volume pleural effusions. No pneumothorax. No acute osseous abnormality. IMPRESSION: Slightly prominent cardiac silhouette. Associated pulmonary edema with bilateral trace to small volume pleural effusions. Superimposed infection/inflammation not excluded. Electronically Signed   By: Iven Finn M.D.   On: 07/17/2021 18:20        Scheduled Meds:  Chlorhexidine Gluconate Cloth  6 each Topical Q0600   cycloSPORINE modified  100 mg Oral BID   finasteride  5 mg Oral Daily   insulin aspart  0-6 Units Subcutaneous TID WC   insulin glargine-yfgn  4 Units Subcutaneous QHS   midodrine  10 mg Oral TID WC   [START ON 07/19/2021] mycophenolate  180 mg Oral BID    pantoprazole  40 mg Oral BID   tamsulosin  0.4 mg Oral BID   warfarin  3 mg Oral ONCE-1600   Warfarin - Pharmacist Dosing Inpatient   Does not apply q1600   Continuous Infusions:  sodium chloride     sodium chloride     cefTRIAXone (ROCEPHIN)  IV 1 g (07/18/21 0838)     LOS: 1 day    Time spent: 35 minutes    Safiatou Islam Darleen Crocker, DO [facility]  If 7PM-7AM, please contact night-coverage [email or web address] 07/18/2021, 1:17 PM

## 2021-07-18 NOTE — Plan of Care (Signed)
  Problem: Education: Goal: Knowledge of General Education information will improve Description: Including pain rating scale, medication(s)/side effects and non-pharmacologic comfort measures Outcome: Progressing   Problem: Clinical Measurements: Goal: Ability to maintain clinical measurements within normal limits will improve Outcome: Progressing Goal: Will remain free from infection Outcome: Progressing Goal: Diagnostic test results will improve Outcome: Progressing Goal: Respiratory complications will improve Outcome: Progressing Goal: Cardiovascular complication will be avoided Outcome: Progressing   Problem: Activity: Goal: Risk for activity intolerance will decrease Outcome: Progressing   Problem: Elimination: Goal: Will not experience complications related to bowel motility Outcome: Progressing Goal: Will not experience complications related to urinary retention Outcome: Progressing   Problem: Skin Integrity: Goal: Risk for impaired skin integrity will decrease Outcome: Progressing-Pt repositioned q2h, wound care performed, new sacral foam dressing applied. No new signs of skin breakdown this shift.    Problem: Safety: Goal: Ability to remain free from injury will improve Outcome: Progressing- High Fall risk protocols in place. No falls or other injuries this shift.

## 2021-07-19 LAB — RENAL FUNCTION PANEL
Albumin: 2.8 g/dL — ABNORMAL LOW (ref 3.5–5.0)
Anion gap: 9 (ref 5–15)
BUN: 33 mg/dL — ABNORMAL HIGH (ref 8–23)
CO2: 28 mmol/L (ref 22–32)
Calcium: 8.5 mg/dL — ABNORMAL LOW (ref 8.9–10.3)
Chloride: 98 mmol/L (ref 98–111)
Creatinine, Ser: 3.14 mg/dL — ABNORMAL HIGH (ref 0.61–1.24)
GFR, Estimated: 19 mL/min — ABNORMAL LOW (ref >60)
Glucose, Bld: 71 mg/dL (ref 70–99)
Phosphorus: 4.9 mg/dL — ABNORMAL HIGH (ref 2.5–4.6)
Potassium: 3.8 mmol/L (ref 3.5–5.1)
Sodium: 135 mmol/L (ref 135–145)

## 2021-07-19 LAB — CBC
HCT: 27.1 % — ABNORMAL LOW (ref 39.0–52.0)
Hemoglobin: 8.1 g/dL — ABNORMAL LOW (ref 13.0–17.0)
MCH: 28.2 pg (ref 26.0–34.0)
MCHC: 29.9 g/dL — ABNORMAL LOW (ref 30.0–36.0)
MCV: 94.4 fL (ref 80.0–100.0)
Platelets: 225 10*3/uL (ref 150–400)
RBC: 2.87 MIL/uL — ABNORMAL LOW (ref 4.22–5.81)
RDW: 16.1 % — ABNORMAL HIGH (ref 11.5–15.5)
WBC: 6.8 10*3/uL (ref 4.0–10.5)
nRBC: 0 % (ref 0.0–0.2)

## 2021-07-19 LAB — MAGNESIUM: Magnesium: 1.7 mg/dL (ref 1.7–2.4)

## 2021-07-19 LAB — URINE CULTURE

## 2021-07-19 LAB — GLUCOSE, CAPILLARY
Glucose-Capillary: 59 mg/dL — ABNORMAL LOW (ref 70–99)
Glucose-Capillary: 80 mg/dL (ref 70–99)
Glucose-Capillary: 169 mg/dL — ABNORMAL HIGH (ref 70–99)
Glucose-Capillary: 164 mg/dL — ABNORMAL HIGH (ref 70–99)
Glucose-Capillary: 170 mg/dL — ABNORMAL HIGH (ref 70–99)

## 2021-07-19 LAB — PROTIME-INR
INR: 1.5 — ABNORMAL HIGH (ref 0.8–1.2)
Prothrombin Time: 17.6 seconds — ABNORMAL HIGH (ref 11.4–15.2)

## 2021-07-19 MED ORDER — CHLORHEXIDINE GLUCONATE CLOTH 2 % EX PADS
6.0000 | MEDICATED_PAD | Freq: Every day | CUTANEOUS | Status: DC
  Administered 2021-07-19 – 2021-07-21 (×3): 6 via TOPICAL

## 2021-07-19 MED ORDER — CARVEDILOL 12.5 MG PO TABS
12.5000 mg | ORAL_TABLET | Freq: Two times a day (BID) | ORAL | Status: DC
  Administered 2021-07-19 – 2021-07-21 (×4): 12.5 mg via ORAL
  Filled 2021-07-19 (×4): qty 1

## 2021-07-19 MED ORDER — MIDODRINE HCL 5 MG PO TABS
5.0000 mg | ORAL_TABLET | Freq: Three times a day (TID) | ORAL | Status: DC
  Administered 2021-07-19 – 2021-07-21 (×6): 5 mg via ORAL
  Filled 2021-07-19 (×5): qty 1

## 2021-07-19 MED ORDER — PROCHLORPERAZINE EDISYLATE 10 MG/2ML IJ SOLN
10.0000 mg | Freq: Four times a day (QID) | INTRAMUSCULAR | Status: DC | PRN
  Filled 2021-07-19: qty 2

## 2021-07-19 MED ORDER — DARBEPOETIN ALFA 150 MCG/0.3ML IJ SOSY
150.0000 ug | PREFILLED_SYRINGE | INTRAMUSCULAR | Status: DC
  Administered 2021-07-20: 150 ug via INTRAVENOUS
  Filled 2021-07-19: qty 0.3

## 2021-07-19 MED ORDER — WARFARIN SODIUM 5 MG PO TABS
6.0000 mg | ORAL_TABLET | Freq: Once | ORAL | Status: AC
  Administered 2021-07-19: 6 mg via ORAL
  Filled 2021-07-19: qty 1

## 2021-07-19 NOTE — Plan of Care (Signed)

## 2021-07-19 NOTE — Progress Notes (Signed)
ANTICOAGULATION CONSULT NOTE -   Pharmacy Consult for Warfarin Indication: atrial fibrillation  Allergies  Allergen Reactions   Lisinopril Other (See Comments)    Other reaction(s): Other (See Comments) Mouth,facial swelling  Mouth,facial swelling  Mouth,facial swelling      Patient Measurements: Height: 5\' 10"  (177.8 cm) Weight: 81.2 kg (179 lb 0.2 oz) IBW/kg (Calculated) : 73   Vital Signs: Temp: 97.6 F (36.4 C) (08/17 0700) Temp Source: Oral (08/17 0700) BP: 126/93 (08/17 0600) Pulse Rate: 86 (08/17 0600)  Labs: Recent Labs    07/17/21 1828 07/18/21 0440 07/19/21 0346  HGB 8.6* 8.2* 8.1*  HCT 27.5* 26.6* 27.1*  PLT 240 240 225  APTT 35  --   --   LABPROT 19.0* 18.6* 17.6*  INR 1.6* 1.6* 1.5*  CREATININE 3.18* 3.68* 3.14*     Estimated Creatinine Clearance: 19.1 mL/min (A) (by C-G formula based on SCr of 3.14 mg/dL (H)).   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   Atrial fibrillation (HCC)    On Coumadin   Basal cell carcinoma 01/08/2017   right forearm ( txpbx)   COPD (chronic obstructive pulmonary disease) (HCC)    Deceased-donor kidney transplant 2015   Diabetic nephropathy (HCC)    End stage renal disease (HCC)    FSGS (focal segmental glomerulosclerosis)    HLD (hyperlipidemia)    HTN (hypertension)    Long-term use of immunosuppressant medication    SCC (squamous cell carcinoma) 05/10/2015   right side of face ([id number])   SCC (squamous cell carcinoma) 04/25/2016   right post scalp ( cx51fu)   SCC (squamous cell carcinoma) 08/27/2017   right post scalp (txpbx) insitu   SCC (squamous cell carcinoma) 05/05/2018   back of scalp (mohs) insitu   SCC (squamous cell carcinoma) 10/21/2018   top of right hand (txpbx) well diff   SCC (squamous cell carcinoma) 10/21/2018   back of crown (txpbx) insitu   SCC (squamous cell carcinoma) 06/18/2019   back of crown (  cx62fu) insitu   Squamous cell carcinoma of skin 05/10/2015   right crown insitu (cx87fu)   Type 2 diabetes mellitus (HCC)     Assessment: 80 yo M on warfarin for afib in the setting of ESRD on dialysis and hx CVA. Noted recent upper GIB due to duodenal ulcer in the setting of INR 5.8. Apixaban was attempted 06/08/21 but stopped due to progressive anemia and melena resulting in hemorrhagic shock. A total of 9 units RBC were given during this July 2022 admission at [facility]. [name]   PTA warfarin dose 3mg  every other day alternating with 6mg . Last dose was 3mg  on 8/14. (Has 6mg  tablets)  INR 1.5   Goal of Therapy:  INR 2-3 >> ideally 2- 2.5 given recent severe bleed and duodenal ulcer Monitor platelets by anticoagulation protocol: Yes   Plan:  Warfarin 6 mg x 1 dose Monitor daily INR, CBC/plt Monitor for signs/symptoms of bleeding    Margot Ables, PharmD Clinical Pharmacist 07/19/2021 7:58 AM

## 2021-07-19 NOTE — Progress Notes (Signed)
PROGRESS NOTE    Kevin Frey  YOV:[id number] DOB: 1941/08/15 DOA: 07/17/2021 PCP: Caryl Bis, MD   Brief Narrative:   Kevin Frey is a 80 y.o. male with medical history significant for atrial fibrillation on warfarin, ESRD with failed renal transplant, insulin-dependent diabetes mellitus, hypertension, now presenting to the emergency department for evaluation of somnolence, hypotension, and hypoxia.  Patient was admitted for acute on chronic hypoxemic respiratory failure in the setting of acute pulmonary edema.  He is also noted to have sepsis in the setting of UTI with urinary obstruction and chronic Foley catheter.  Started on Rocephin today empirically with cultures pending.  Nephrology plans for hemodialysis today.  Assessment & Plan:   Principal Problem:   Acute pulmonary edema (HCC) Active Problems:   Anemia   Dysphagia   Lower urinary tract obstruction   Acute on chronic respiratory failure with hypoxia (HCC)   Unspecified atrial fibrillation (HCC)   PUD (peptic ulcer disease)   Diabetes mellitus, type II, insulin dependent ([place])   Renal transplant recipient   Prolonged QT interval   Pressure sore   Sepsis secondary to UTI -In the setting of urinary retention and chronic Foley catheter usage -continues on Flomax -Foley catheter exchange on 07/18/2021 -Safety feature has significantly improved; continue empirical use of Rocephin. -Lactic acid has now normalized -Blood pressure stable and patient is afebrile.  Acute on chronic hypoxemic respiratory failure in the setting of pulmonary edema -Gradual improvement of patient's oxygen saturation and shortness of breath symptoms after dialysis on a 1622 -Currently good oxygen saturation on his chronic 3 L nasal cannula supplementation -Will assess oxygen needs and changes with activity.  ESRD on HD MWF status post renal transplant -Per nephrology recommendations plan is for hemodialysis again 07/20/2021 -Patient  will continue the use of midodrine and albumin to assist with blood pressure and volume removal. -continue the use of half dose Myfortic as per nephrology recommendations.  History of hypertension -Soft blood pressure, but is stable -Continue the use of midodrine. -Follow vital signs.  Anemia of ESRD -IV iron and Epogen therapy as per nephrology discretion -No overt bleeding appreciated -Continue to follow hemoglobin trend.  Atrial fibrillation -Rate is stable and well-controlled without the use of any medications currently -Continue telemetry evaluation -Continues on warfarin, dosed per pharmacy.  History of peptic ulcer disease -Continue PPI twice daily -No overt bleeding appreciated.  Insulin-dependent diabetes -Continue insulin as ordered -Mild hypoglycemic event appreciated this morning after not having much for dinner. -We will follow CBGs and continue adjusting hypoglycemia regimen as needed.  Dysphagia -Requiring thickened liquids -Unchanged -Continue current interventions.  DVT prophylaxis: Warfarin Code Status: Full Family Communication: No family at bedside; patient expressed that he will contact his wife for updates. Disposition Plan:  Status is: Inpatient  Remains inpatient appropriate because:Ongoing diagnostic testing needed not appropriate for outpatient work up, IV treatments appropriate due to intensity of illness or inability to take PO, and Inpatient level of care appropriate due to severity of illness  Dispo: The patient is from: Home              Anticipated d/c is to: Home              Patient currently is not medically stable to d/c.   Difficult to place patient No   Consultants:  Nephrology  Procedures:  See below  Antimicrobials:  Anti-infectives (From admission, onward)    Start     Dose/Rate Route Frequency Ordered Stop  07/18/21 0845  cefTRIAXone (ROCEPHIN) 1 g in sodium chloride 0.9 % 100 mL IVPB        1 g 200 mL/hr over 30  Minutes Intravenous Every 24 hours 07/18/21 0745        Subjective: Patient with good tolerance hemodialysis off schedule on 07/18/2021; approximately 3 L were removed.  Patient reports improvement in his breathing, afebrile, no chest pain, no nausea, no vomiting, no orthopnea currently.  Objective: Vitals:   07/19/21 0705 07/19/21 0800 07/19/21 0900 07/19/21 1000  BP:  126/66 137/71 127/81  Pulse: 84 78 90 87  Resp: 16 (!) 22 19 18   Temp:      TempSrc:      SpO2: 100% 91% 98% 99%  Weight:      Height:        Intake/Output Summary (Last 24 hours) at 07/19/2021 1045 Last data filed at 07/19/2021 1000 Gross per 24 hour  Intake 220 ml  Output 3150 ml  Net -2930 ml   Filed Weights   07/18/21 0950 07/18/21 1255 07/19/21 0557  Weight: 82.4 kg 79.4 kg 81.2 kg    Examination: General exam: Alert, awake, oriented x 3; reports no chest pain, no palpitations, no nausea vomiting.  Breathing has improved and he is currently denying orthopnea. Respiratory system: Decreased breath sounds at the bases; no wheezing, no using accessory muscles. Cardiovascular system: Rate controlled, positive systolic ejection murmur, no rubs, no gallops Gastrointestinal system: Abdomen is nondistended, soft and nontender. No organomegaly or masses felt. Normal bowel sounds heard. Central nervous system: Alert and oriented. No focal neurological deficits. Extremities: No cyanosis or clubbing; trace edema appreciated bilaterally. Skin: No petechiae. Psychiatry: Judgement and insight appear normal. Mood & affect appropriate.    Data Reviewed: I have personally reviewed following labs and imaging studies  CBC: Recent Labs  Lab 07/17/21 1828 07/18/21 0440 07/19/21 0346  WBC 9.2 10.8* 6.8  NEUTROABS 8.4*  --   --   HGB 8.6* 8.2* 8.1*  HCT 27.5* 26.6* 27.1*  MCV 94.8 93.7 94.4  PLT 240 240 916   Basic Metabolic Panel: Recent Labs  Lab 07/17/21 1828 07/18/21 0440 07/19/21 0346  NA 133* 135 135  K  4.2 4.2 3.8  CL 97* 95* 98  CO2 25 27 28   GLUCOSE 214* 147* 71  BUN 37* 46* 33*  CREATININE 3.18* 3.68* 3.14*  CALCIUM 8.1* 8.4* 8.5*  MG  --  1.7 1.7  PHOS  --   --  4.9*   GFR: Estimated Creatinine Clearance: 19.1 mL/min (A) (by C-G formula based on SCr of 3.14 mg/dL (H)).  Liver Function Tests: Recent Labs  Lab 07/17/21 1828 07/19/21 0346  AST 40  --   ALT 20  --   ALKPHOS 73  --   BILITOT 1.3*  --   PROT 6.0*  --   ALBUMIN 2.9* 2.8*    Coagulation Profile: Recent Labs  Lab 07/17/21 1828 07/18/21 0440 07/19/21 0346  INR 1.6* 1.6* 1.5*    HbA1C: Recent Labs    07/18/21 0440  HGBA1C 4.9   CBG: Recent Labs  Lab 07/18/21 1206 07/18/21 1643 07/18/21 2112 07/19/21 0724 07/19/21 0744  GLUCAP 119* 113* 174* 59* 80    Sepsis Labs: Recent Labs  Lab 07/17/21 1828 07/17/21 2016  LATICACIDVEN 2.1* 1.7    Recent Results (from the past 240 hour(s))  Resp Panel by RT-PCR (Flu A&B, Covid) Nasopharyngeal Swab     Status: None   Collection Time: 07/17/21  5:38 PM   Specimen: Nasopharyngeal Swab; Nasopharyngeal(NP) swabs in vial transport medium  Result Value Ref Range Status   SARS Coronavirus 2 by RT PCR NEGATIVE NEGATIVE Final    Comment: (NOTE) SARS-CoV-2 target nucleic acids are NOT DETECTED.  The SARS-CoV-2 RNA is generally detectable in upper respiratory specimens during the acute phase of infection. The lowest concentration of SARS-CoV-2 viral copies this assay can detect is 138 copies/mL. A negative result does not preclude SARS-Cov-2 infection and should not be used as the sole basis for treatment or other patient management decisions. A negative result may occur with  improper specimen collection/handling, submission of specimen other than nasopharyngeal swab, presence of viral mutation(s) within the areas targeted by this assay, and inadequate number of viral copies(<138 copies/mL). A negative result must be combined with clinical  observations, patient history, and epidemiological information. The expected result is Negative.  Fact Sheet for Patients:  EntrepreneurPulse.com.au  Fact Sheet for Healthcare Providers:  IncredibleEmployment.be  This test is no t yet approved or cleared by the [place] FDA and  has been authorized for detection and/or diagnosis of SARS-CoV-2 by FDA under an Emergency Use Authorization (EUA). This EUA will remain  in effect (meaning this test can be used) for the duration of the COVID-19 declaration under Section 564(b)(1) of the Act, 21 U.S.C.section 360bbb-3(b)(1), unless the authorization is terminated  or revoked sooner.       Influenza A by PCR NEGATIVE NEGATIVE Final   Influenza B by PCR NEGATIVE NEGATIVE Final    Comment: (NOTE) The Xpert Xpress SARS-CoV-2/FLU/RSV plus assay is intended as an aid in the diagnosis of influenza from Nasopharyngeal swab specimens and should not be used as a sole basis for treatment. Nasal washings and aspirates are unacceptable for Xpert Xpress SARS-CoV-2/FLU/RSV testing.  Fact Sheet for Patients: EntrepreneurPulse.com.au  Fact Sheet for Healthcare Providers: IncredibleEmployment.be  This test is not yet approved or cleared by the [place] FDA and has been authorized for detection and/or diagnosis of SARS-CoV-2 by FDA under an Emergency Use Authorization (EUA). This EUA will remain in effect (meaning this test can be used) for the duration of the COVID-19 declaration under Section 564(b)(1) of the Act, 21 U.S.C. section 360bbb-3(b)(1), unless the authorization is terminated or revoked.  Performed at [facility], [address] [place], [place] [address]   Blood Culture (routine x 2)     Status: None (Preliminary result)   Collection Time: 07/17/21  6:28 PM   Specimen: BLOOD RIGHT HAND  Result Value Ref Range Status   Specimen Description BLOOD RIGHT  HAND  Final   Special Requests   Final    BOTTLES DRAWN AEROBIC AND ANAEROBIC Blood Culture adequate volume   Culture   Final    NO GROWTH 2 DAYS Performed at [facility], [address] [place], [place] [address]    Report Status PENDING  Incomplete  Blood Culture (routine x 2)     Status: None (Preliminary result)   Collection Time: 07/17/21  6:28 PM   Specimen: BLOOD RIGHT FOREARM  Result Value Ref Range Status   Specimen Description BLOOD RIGHT FOREARM  Final   Special Requests   Final    BOTTLES DRAWN AEROBIC AND ANAEROBIC Blood Culture adequate volume   Culture   Final    NO GROWTH 2 DAYS Performed at [facility], [address] [place], [place] [address]    Report Status PENDING  Incomplete  MRSA Next Gen by PCR, Nasal  Status: None   Collection Time: 07/18/21  1:21 AM   Specimen: Nasal Mucosa; Nasal Swab  Result Value Ref Range Status   MRSA by PCR Next Gen NOT DETECTED NOT DETECTED Final    Comment: (NOTE) The GeneXpert MRSA Assay (FDA approved for NASAL specimens only), is one component of a comprehensive MRSA colonization surveillance program. It is not intended to diagnose MRSA infection nor to guide or monitor treatment for MRSA infections. Test performance is not FDA approved in patients less than 96 years old. Performed at [facility], [address], [place], [place] [address]   Urine Culture     Status: Abnormal   Collection Time: 07/18/21  1:47 AM   Specimen: In/Out Cath Urine  Result Value Ref Range Status   Specimen Description   Final    IN/OUT CATH URINE Performed at [facility], [address], [place], [place] [address]    Special Requests   Final    NONE Performed at [facility], [address], [place], [place] [address]    Culture MULTIPLE SPECIES PRESENT, SUGGEST RECOLLECTION (A)  Final   Report Status 07/19/2021 FINAL  Final     Radiology Studies: [facility] Chest Port 1 View  Result Date: 07/17/2021 CLINICAL DATA:  Questionable sepsis.  States pt was at dialysis, pt hypotensive upon arrival, became hard to arouse, on oxygen per patient. Sats at 70% on 4L. Afib on monitor hx of COPD and HTN, former smoker. EXAM: PORTABLE CHEST 1 VIEW COMPARISON:  Chest x-ray 05/31/2025. FINDINGS: Right chest wall dialysis catheter upper with tip overlying the expected region of superior caval junction. Cardiac silhouette slightly prominent possibly due to portable AP technique and low lung volumes. The heart size and mediastinal contours are within normal limits. Prominence of the hilar vasculature. Aortic calcification. Low lung volumes. No focal consolidation. Increased interstitial markings. Bilateral trace to small volume pleural effusions. No pneumothorax. No acute osseous abnormality. IMPRESSION: Slightly prominent cardiac silhouette. Associated pulmonary edema with bilateral trace to small volume pleural effusions. Superimposed infection/inflammation not excluded. Electronically Signed   By: Iven Finn M.D.   On: 07/17/2021 18:20     Scheduled Meds:  Chlorhexidine Gluconate Cloth  6 each Topical Q0600   Chlorhexidine Gluconate Cloth  6 each Topical Q0600   cycloSPORINE modified  100 mg Oral BID   darbepoetin (ARANESP) injection - DIALYSIS  150 mcg Intravenous Q Wed-HD   finasteride  5 mg Oral Daily   insulin aspart  0-6 Units Subcutaneous TID WC   insulin glargine-yfgn  4 Units Subcutaneous QHS   midodrine  10 mg Oral TID WC   mycophenolate  180 mg Oral BID   pantoprazole  40 mg Oral BID   tamsulosin  0.4 mg Oral BID   warfarin  6 mg Oral ONCE-1600   Warfarin - Pharmacist Dosing Inpatient   Does not apply q1600   Continuous Infusions:  sodium chloride     sodium chloride     cefTRIAXone (ROCEPHIN)  IV 1 g (07/19/21 1000)     LOS: 2 days    Time spent: 35 minutes    Barton Dubois, MD [facility]  If 7PM-7AM, please contact night-coverage [email or web address] 07/19/2021, 10:45 AM

## 2021-07-19 NOTE — Plan of Care (Signed)
  Problem: Education: Goal: Knowledge of General Education information will improve Description: Including pain rating scale, medication(s)/side effects and non-pharmacologic comfort measures Outcome: Progressing   Problem: Clinical Measurements: Goal: Ability to maintain clinical measurements within normal limits will improve Outcome: Progressing- Pt bp hypertensive at times throughout shift. Attending notified. Pt's wife requesting pt resume home dose of coreg and request to adjust midodrine to dialysis days only. Attending notified of request and elevated bp. Coreg added to pt's MAR. No changes to midodrine at this time. Medication given per order.    Problem: Clinical Measurements: Goal: Cardiovascular complication will be avoided Outcome: Progressing   Problem: Activity: Goal: Risk for activity intolerance will decrease Outcome: Progressing   Problem: Skin Integrity: Goal: Risk for impaired skin integrity will decrease Outcome: Progressing- No new skin breakdown or other injuries this shift. Pt repositioned q2h.    Problem: Safety: Goal: Ability to remain free from injury will improve Outcome: Progressing- High fall risk protocol in place. No falls this shift.

## 2021-07-19 NOTE — Progress Notes (Signed)
Subjective:  HD yest off schedule-  removed 3000-  tolerated well-  still req 3 liters of O2 this AM-  BP overall better/higher-  100 of UOP-  he tells me his foley was changed out-  he wants to go home  Objective Vital signs in last 24 hours: Vitals:   07/19/21 0500 07/19/21 0557 07/19/21 0600 07/19/21 0700  BP: (!) 145/74  (!) 126/93   Pulse: 73  86   Resp: 19  18   Temp:  98.2 F (36.8 C)  97.6 F (36.4 C)  TempSrc:  Axillary  Oral  SpO2: 100%  99%   Weight:  81.2 kg    Height:       Weight change: -40.1 kg  Intake/Output Summary (Last 24 hours) at 07/19/2021 0906 Last data filed at 07/19/2021 0000 Gross per 24 hour  Intake 220 ml  Output 3100 ml  Net -2880 ml    Dialyzes at [facility]   MWF  EDW 80.5. 4 hours HD Bath 2/2.5, Dialyzer Rev 300, Heparin none. Access TDC-  300 BFR.  Epo 2400 q tx and venofer 50 weekly-  have not been challenging because is acute   Assessment/Plan: 80 year old WM with many medical issues including new ESRD after transplant failure-  presents with sepsis type syndrome and volume overload 1 sepsis-  with elevated lactate and soft BP-  sources include the TDC vs indwelling foley and UTI- urine and blood cultures are pending  ( blood cultures neg to date and urine-  mult species)-  on rocephin -  clinically looks a little better again-  I have taken myfortic down to half dose given clinical scenario 2 ESRD: normally at [facility] on MWF-  is acute designation-  approaching 5 weeks of being HD dep-  I am not that optimistic regarding possible recovery.  HD yesterday off schedule due to hypoxia and volume overload-  using albumin and midodrine to remove volume.  Will plan on HD tomorrow again off schedule  3 Hypertension: BP soft, reportedly on norvasc, coreg, verapamil, cozaar  and hydralazine ?  as OP, also rapaflo and flomax-  all on hold now except flomax-  challenge EDW as able, still with volume on board-  started midodrine 4. Anemia of ESRD: will  continue with ESA - hold iron given scenario 5. Metabolic Bone Disease: reportedly on no meds for this as OP-  will check calc and phos and act as needed -  are WNL 6.  Urinary retention-  urine looks purulent - on rocephin.  Can foley be changed here ??  Is established with urology had appt today actually  7. S/p renal transplant -  remains on IS meds-  will dec myfortic as above    Louis Meckel    Labs: Basic Metabolic Panel: Recent Labs  Lab 07/17/21 1828 07/18/21 0440 07/19/21 0346  NA 133* 135 135  K 4.2 4.2 3.8  CL 97* 95* 98  CO2 25 27 28   GLUCOSE 214* 147* 71  BUN 37* 46* 33*  CREATININE 3.18* 3.68* 3.14*  CALCIUM 8.1* 8.4* 8.5*  PHOS  --   --  4.9*   Liver Function Tests: Recent Labs  Lab 07/17/21 1828 07/19/21 0346  AST 40  --   ALT 20  --   ALKPHOS 73  --   BILITOT 1.3*  --   PROT 6.0*  --   ALBUMIN 2.9* 2.8*   No results for input(s): LIPASE, AMYLASE in the  last 168 hours. No results for input(s): AMMONIA in the last 168 hours. CBC: Recent Labs  Lab 07/17/21 1828 07/18/21 0440 07/19/21 0346  WBC 9.2 10.8* 6.8  NEUTROABS 8.4*  --   --   HGB 8.6* 8.2* 8.1*  HCT 27.5* 26.6* 27.1*  MCV 94.8 93.7 94.4  PLT 240 240 225   Cardiac Enzymes: No results for input(s): CKTOTAL, CKMB, CKMBINDEX, TROPONINI in the last 168 hours. CBG: Recent Labs  Lab 07/18/21 1206 07/18/21 1643 07/18/21 2112 07/19/21 0724 07/19/21 0744  GLUCAP 119* 113* 174* 59* 80    Iron Studies: No results for input(s): IRON, TIBC, TRANSFERRIN, FERRITIN in the last 72 hours. Studies/Results: DG Chest Port 1 View  Result Date: 07/17/2021 CLINICAL DATA:  Questionable sepsis. States pt was at dialysis, pt hypotensive upon arrival, became hard to arouse, on oxygen per patient. Sats at 70% on 4L. Afib on monitor hx of COPD and HTN, former smoker. EXAM: PORTABLE CHEST 1 VIEW COMPARISON:  Chest x-ray 05/31/2025. FINDINGS: Right chest wall dialysis catheter upper with tip  overlying the expected region of superior caval junction. Cardiac silhouette slightly prominent possibly due to portable AP technique and low lung volumes. The heart size and mediastinal contours are within normal limits. Prominence of the hilar vasculature. Aortic calcification. Low lung volumes. No focal consolidation. Increased interstitial markings. Bilateral trace to small volume pleural effusions. No pneumothorax. No acute osseous abnormality. IMPRESSION: Slightly prominent cardiac silhouette. Associated pulmonary edema with bilateral trace to small volume pleural effusions. Superimposed infection/inflammation not excluded. Electronically Signed   By: Iven Finn M.D.   On: 07/17/2021 18:20   Medications: Infusions:  sodium chloride     sodium chloride     cefTRIAXone (ROCEPHIN)  IV Stopped (07/18/21 0908)    Scheduled Medications:  Chlorhexidine Gluconate Cloth  6 each Topical Q0600   cycloSPORINE modified  100 mg Oral BID   finasteride  5 mg Oral Daily   insulin aspart  0-6 Units Subcutaneous TID WC   insulin glargine-yfgn  4 Units Subcutaneous QHS   midodrine  10 mg Oral TID WC   mycophenolate  180 mg Oral BID   pantoprazole  40 mg Oral BID   tamsulosin  0.4 mg Oral BID   warfarin  6 mg Oral ONCE-1600   Warfarin - Pharmacist Dosing Inpatient   Does not apply q1600    have reviewed scheduled and prn medications.  Physical Exam: General:  alert, some slow speech but do not know baseline Heart: RRR Lungs: dec BS at bases Abdomen: soft, non tender Extremities: pitting edema Dialysis Access: TDC-  no obvious infection    07/19/2021,9:06 AM  LOS: 2 days

## 2021-07-20 DIAGNOSIS — L89322 Pressure ulcer of left buttock, stage 2: Secondary | ICD-10-CM

## 2021-07-20 DIAGNOSIS — R9431 Abnormal electrocardiogram [ECG] [EKG]: Secondary | ICD-10-CM

## 2021-07-20 DIAGNOSIS — N186 End stage renal disease: Secondary | ICD-10-CM

## 2021-07-20 DIAGNOSIS — I482 Chronic atrial fibrillation, unspecified: Secondary | ICD-10-CM

## 2021-07-20 LAB — RENAL FUNCTION PANEL
Albumin: 2.8 g/dL — ABNORMAL LOW (ref 3.5–5.0)
Anion gap: 10 (ref 5–15)
BUN: 47 mg/dL — ABNORMAL HIGH (ref 8–23)
CO2: 26 mmol/L (ref 22–32)
Calcium: 7.9 mg/dL — ABNORMAL LOW (ref 8.9–10.3)
Chloride: 97 mmol/L — ABNORMAL LOW (ref 98–111)
Creatinine, Ser: 4.28 mg/dL — ABNORMAL HIGH (ref 0.61–1.24)
GFR, Estimated: 13 mL/min — ABNORMAL LOW (ref >60)
Glucose, Bld: 127 mg/dL — ABNORMAL HIGH (ref 70–99)
Phosphorus: 6.3 mg/dL — ABNORMAL HIGH (ref 2.5–4.6)
Potassium: 3.9 mmol/L (ref 3.5–5.1)
Sodium: 133 mmol/L — ABNORMAL LOW (ref 135–145)

## 2021-07-20 LAB — CBC
HCT: 26.8 % — ABNORMAL LOW (ref 39.0–52.0)
Hemoglobin: 8.5 g/dL — ABNORMAL LOW (ref 13.0–17.0)
MCH: 29.8 pg (ref 26.0–34.0)
MCHC: 31.7 g/dL (ref 30.0–36.0)
MCV: 94 fL (ref 80.0–100.0)
Platelets: 223 10*3/uL (ref 150–400)
RBC: 2.85 MIL/uL — ABNORMAL LOW (ref 4.22–5.81)
RDW: 16.1 % — ABNORMAL HIGH (ref 11.5–15.5)
WBC: 7.3 10*3/uL (ref 4.0–10.5)
nRBC: 0 % (ref 0.0–0.2)

## 2021-07-20 LAB — GLUCOSE, CAPILLARY
Glucose-Capillary: 120 mg/dL — ABNORMAL HIGH (ref 70–99)
Glucose-Capillary: 163 mg/dL — ABNORMAL HIGH (ref 70–99)
Glucose-Capillary: 115 mg/dL — ABNORMAL HIGH (ref 70–99)
Glucose-Capillary: 184 mg/dL — ABNORMAL HIGH (ref 70–99)

## 2021-07-20 LAB — PROTIME-INR
INR: 1.6 — ABNORMAL HIGH (ref 0.8–1.2)
Prothrombin Time: 18.8 seconds — ABNORMAL HIGH (ref 11.4–15.2)

## 2021-07-20 MED ORDER — MYCOPHENOLATE SODIUM 180 MG PO TBEC: 180.0000 mg | DELAYED_RELEASE_TABLET | Freq: Two times a day (BID) | ORAL | Status: AC

## 2021-07-20 MED ORDER — ALBUMIN HUMAN 25 % IV SOLN
25.0000 g | INTRAVENOUS | Status: DC | PRN
Start: 2021-07-20 — End: 2021-07-21
  Administered 2021-07-20 (×2): 25 g via INTRAVENOUS

## 2021-07-20 MED ORDER — WARFARIN SODIUM 2 MG PO TABS
4.0000 mg | ORAL_TABLET | Freq: Once | ORAL | Status: AC
  Administered 2021-07-20: 4 mg via ORAL
  Filled 2021-07-20: qty 2

## 2021-07-20 MED ORDER — FINASTERIDE 5 MG PO TABS: 5.0000 mg | ORAL_TABLET | Freq: Every day | ORAL | 0 refills | Status: AC

## 2021-07-20 MED ORDER — CEFDINIR 300 MG PO CAPS: 300.0000 mg | ORAL_CAPSULE | Freq: Every day | ORAL | 0 refills | Status: AC

## 2021-07-20 MED ORDER — HEPARIN SODIUM (PORCINE) 1000 UNIT/ML DIALYSIS: 20.0000 [IU]/kg | INTRAMUSCULAR | Status: DC | PRN

## 2021-07-20 MED ORDER — MIDODRINE HCL 5 MG PO TABS: 5.0000 mg | ORAL_TABLET | Freq: Two times a day (BID) | ORAL | 0 refills | Status: DC

## 2021-07-20 NOTE — TOC Progression Note (Signed)
Transition of Care [facility]) - Progression Note    Patient Details  Name: Carston Riedl MRN: [id number] Date of Birth: 11-14-1941  [facility] of [facility]) CM/SW Contact  Salome Arnt, [place] Phone Number: 07/20/2021, 8:24 AM  Clinical Narrative:  Discussed with pt's wife out of pocket cost of about $199 for rollator as pt used benefit prior for walker. Pt's wife indicates they may consider purchasing online. Pt's wife plans to come to hospital soon and would like update from MD. [facility] notified MD.      Expected Discharge Plan: [place] Barriers to Discharge: Continued Medical Work up  Expected Discharge Plan and Services Expected Discharge Plan: [place] In-house Referral: Clinical Social Work Discharge Planning Services: CM Consult Post Acute Care Choice: [place] arrangements for the past 2 months: Single Family Home                                       Social Determinants of Health (SDOH) Interventions    Readmission Risk Interventions Readmission Risk Prevention Plan 07/18/2021  Transportation Screening Complete  HRI or Home Care Consult Complete  Social Work Consult for [place] Planning/Counseling Complete  Palliative Care Screening Not Applicable  Medication Review Press photographer) Complete  Some recent data might be hidden

## 2021-07-20 NOTE — Care Management Important Message (Signed)
Important Message  Patient Details  Name: Kevin Frey MRN: [id number] Date of Birth: November 07, 1941   Medicare Important Message Given:  Yes     Tommy Medal 07/20/2021, 11:47 AM

## 2021-07-20 NOTE — Progress Notes (Addendum)
ANTICOAGULATION CONSULT NOTE -   Pharmacy Consult for Warfarin Indication: atrial fibrillation  Allergies  Allergen Reactions   Lisinopril Other (See Comments)    Other reaction(s): Other (See Comments) Mouth,facial swelling  Mouth,facial swelling  Mouth,facial swelling      Patient Measurements: Height: 5\' 10"  (177.8 cm) Weight: 80.8 kg (178 lb 2.1 oz) IBW/kg (Calculated) : 73   Vital Signs: Temp: 97.7 F (36.5 C) (08/18 0700) Temp Source: Oral (08/18 0700) BP: 153/58 (08/18 0500) Pulse Rate: 69 (08/18 0600)  Labs: Recent Labs    07/17/21 1828 07/18/21 0440 07/19/21 0346 07/20/21 0521  HGB 8.6* 8.2* 8.1* 8.5*  HCT 27.5* 26.6* 27.1* 26.8*  PLT 240 240 225 223  APTT 35  --   --   --   LABPROT 19.0* 18.6* 17.6* 18.8*  INR 1.6* 1.6* 1.5* 1.6*  CREATININE 3.18* 3.68* 3.14* 4.28*     Estimated Creatinine Clearance: 14 mL/min (A) (by C-G formula based on SCr of 4.28 mg/dL (H)).   Medical History: Past Medical History:  Diagnosis Date   Acute ischemic vertebrobasilar artery thalamic stroke ([place]) 2015   Atrial fibrillation (HCC)    On Coumadin   Basal cell carcinoma 01/08/2017   right forearm ( txpbx)   COPD (chronic obstructive pulmonary disease) (HCC)    Deceased-donor kidney transplant 2015   Diabetic nephropathy (HCC)    End stage renal disease (HCC)    FSGS (focal segmental glomerulosclerosis)    HLD (hyperlipidemia)    HTN (hypertension)    Long-term use of immunosuppressant medication    SCC (squamous cell carcinoma) 05/10/2015   right side of face (cx79fu)   SCC (squamous cell carcinoma) 04/25/2016   right post scalp ( cx32fu)   SCC (squamous cell carcinoma) 08/27/2017   right post scalp (txpbx) insitu   SCC (squamous cell carcinoma) 05/05/2018   back of scalp (mohs) insitu   SCC (squamous cell carcinoma) 10/21/2018   top of right hand (txpbx) well diff   SCC (squamous cell carcinoma) 10/21/2018   back of crown (txpbx) insitu   SCC (squamous  cell carcinoma) 06/18/2019   back of crown ( cx70fu) insitu   Squamous cell carcinoma of skin 05/10/2015   right crown insitu (cx43fu)   Type 2 diabetes mellitus (HCC)     Assessment: 80 yo M on warfarin for afib in the setting of ESRD on dialysis and hx CVA. Noted recent upper GIB due to duodenal ulcer in the setting of INR 5.8. Apixaban was attempted 06/08/21 but stopped due to progressive anemia and melena resulting in hemorrhagic shock. A total of 9 units RBC were given during this July 2022 admission at [facility]. [name]   PTA warfarin dose 3mg  every other day alternating with 6mg . Last dose was 3mg  on 8/14. (Has 6mg  tablets)  INR 1.5> 1.6   Goal of Therapy:  INR 2-3 >> ideally 2- 2.5 given recent severe bleed and duodenal ulcer Monitor platelets by anticoagulation protocol: Yes   Plan:  Warfarin 4 mg x 1 dose Monitor daily INR, CBC/plt Monitor for signs/symptoms of bleeding    Margot Ables, PharmD Clinical Pharmacist 07/20/2021 8:27 AM

## 2021-07-20 NOTE — Discharge Summary (Addendum)
Physician Discharge Summary  Mat Stuard YYQ:[id number] DOB: 14-Jun-1941 DOA: 07/17/2021  PCP: Caryl Bis, MD  Admit date: 07/17/2021 Discharge date: 07/21/2021  Time spent: 35 minutes  Recommendations for Outpatient Follow-up:  Repeat CBC to follow hemoglobin trend and instability Reassess blood pressure and adjust antihypertensive regimen as needed Continue close monitoring of patient's CBGs with adjustment to hypoglycemic therapy as required. Outpatient follow-up with urology service for cystoscopy, voiding trial and cystoscopy urethrogram and to further evaluate ongoing condition.   Discharge Diagnoses:  Principal Problem:   Acute pulmonary edema (HCC) Active Problems:   Anemia   Dysphagia   Lower urinary tract obstruction   Acute on chronic respiratory failure with hypoxia (HCC)   Atrial fibrillation, chronic (HCC)   PUD (peptic ulcer disease)   Diabetes mellitus, type II, insulin dependent ([place])   Renal transplant recipient   Prolonged QT interval   Pressure sore   ESRD (end stage renal disease) ([place])   Discharge Condition: Stable and improved.  Discharged home with instruction to follow-up with PCP and neurology as an outpatient.  CODE STATUS: Full code.  Diet recommendation: Heart healthy modified carbohydrate diet.  Filed Weights   07/19/21 0557 07/20/21 0500 07/20/21 1320  Weight: 81.2 kg 80.8 kg 80.8 kg    History of present illness:  As per H&P written by Dr. Myna Hidalgo on a [place] is a 80 y.o. male with medical history significant for atrial fibrillation on warfarin, ESRD with failed renal transplant, insulin-dependent diabetes mellitus, hypertension, now presenting to the emergency department for evaluation of somnolence, hypotension, and hypoxia.  Patient is accompanied by his wife who assists with the history.  Patient has been little more short of breath than usual recently but had otherwise been doing fairly well at home yesterday, had trouble  sleeping last night, was up very early this morning, and then reports that he fell asleep and was having trouble waking up after dialysis was completed.  His wife reports that he completed dialysis but that they were unable to pull any fluid off due to hypotension.  They had trouble waking him up afterwards and called EMS who found the patient to have a blood pressure in the 88Q systolic, was difficult to arouse initially, and was saturating 70% on his usual 4 L/min of supplemental oxygen.  He was placed on 10 L/min, became more alert, and blood pressure also improved prior to his arrival in the ED.  He denies any change in chronic mild cough, denies any recent chest pain, denies fevers or chills, and denies any headache, change in vision, or focal numbness or weakness.     The patient was admitted to [facility] for acute on chronic renal failure recently, admission was complicated by upper GI bleed with hemorrhagic shock, UTI and pneumonia with sepsis, was there for more than 3 weeks and discharged on 06/23/2021.  He had a catheter placed in the right IJ and was started on hemodialysis during the admission.  He was supratherapeutic with his warfarin, developed hemorrhagic shock, had bleeding duodenal ulcer on EGD, was transfused 9 units RBC, 1 unit of platelets, and started on 8 weeks of PPI therapy.  He was also treated with 7 days of meropenem during the hospitalization.   ED Course: Upon arrival to the ED, patient is found to be afebrile, requiring 6 L/min of supplemental oxygen, mildly tachypneic, and with systolic blood pressure in the low 100s.  EKG features atrial fibrillation with QTc interval  534 ms.  Chest x-ray with slightly prominent cardiac silhouette, pulmonary edema, and bilateral trace to small pleural effusions.  Chemistry panel sodium 133, normal bicarbonate, BUN 37, glucose 214, and albumin 2.9.  CBC features hemoglobin of 8.6.  Lactic acid was 2.1 initially, INR 1.6,  and BNP 1278.  Patient had been given 500 cc of saline and started on LR infusion in the emergency department.  Nephrology was consulted by the ED physician and recommends admission for inpatient dialysis.  Hospital Course:   Sepsis secondary to UTI -In the setting of urinary retention and chronic Foley catheter usage -continue on Flomax and finasteride. -Foley catheter exchange on 07/18/2021 -Sepsis features has significantly improved/resolved -Patient has been discharged on oral cefdinir to complete antibiotic therapy. -Lactic acid has now normalized -Blood pressure stable and patient is afebrile and ready to go home.   Acute on chronic hypoxemic respiratory failure in the setting of pulmonary edema -Patient expressed breathing back to baseline and is able to speak in full sentences at discharge. -Currently good oxygen saturation on his chronic 3 L nasal cannula supplementation -Continue as needed bronchodilators.   ESRD on HD MWF status post renal transplant -Per nephrology recommendations patient will have dialysis again on 07/21/2021 in order to put patient back on schedule. -Continue the use of half dose Myfortic as per nephrology recommendations.   History of hypertension -Blood pressure stable and well-controlled at time of discharge -Patient will continue the use of adjusted dose of carvedilol and also continue the use of Proscar and Flomax. -Continue the use of midodrine at adjusted dose. -Follow vital signs and make further adjustment as required.  History of BPH -Presented with chronic Foley indwelling catheter in place; which has been exchanged due to ongoing purulence discharge -Patient will continue the use of Flomax and finasteride -Will need outpatient follow-up with urology service for cystoscopy, voiding trial and cystourethrogram.   Anemia of ESRD -IV iron and Epogen therapy as per nephrology discretion -No overt bleeding appreciated -Continue to follow hemoglobin  trend with repeat CBC at follow-up visit.   Chronic atrial fibrillation -Rate is stable and well-controlled  -Continue adjusted low-dose of beta-blockers.   -Continues on warfarin for secondary prevention.   History of peptic ulcer disease -Continue PPI twice daily -No overt bleeding appreciated. -Patient advised to stop the use of any NSAIDs.   Insulin-dependent diabetes -Continue insulin as per prior to admission regimen -Advised to follow modified carbohydrate diet.   Dysphagia -Requiring thickened liquids -Unchanged from his baseline -Continue current interventions and outpatient follow-up with PCP/GI service.  Stage II left buttock pressure injury -No signs of superimposed infection -Continue preventive measures and constant repositioning. -Pressure injury present at time of admission.  Procedures: See below for x-ray reports. Consultations: Nephrology service  Discharge Exam: Vitals:   07/20/21 1615 07/20/21 1630  BP: (!) 148/82 (!) 143/82  Pulse: 68 66  Resp: 15 19  Temp:    SpO2:     General exam: Alert, awake, oriented x 3; reports no chest pain, no palpitations, no nausea or vomiting.  Patient breathing is back to baseline; no using accessory muscles.  Able to speak in full sentences, on his chronic oxygen supplementation (3 L nasal cannula).   Respiratory system: Improved air movement bilaterally; no frank crackles at his bases bilaterally, no wheezing or using accessory muscles. Cardiovascular system: Rate controlled, positive systolic ejection murmur, no rubs, no gallops, trace edema appreciated bilaterally. Gastrointestinal system: Abdomen is nondistended, soft and nontender. No organomegaly or  masses felt. Normal bowel sounds heard. Central nervous system: Alert and oriented. No focal neurological deficits. Extremities: No cyanosis or clubbing; trace edema appreciated bilaterally. Skin: No petechiae.  Stage II left buttock pressure injury without signs of  superimposed infection; present on admission. Psychiatry: Judgement and insight appear normal. Mood & affect appropriate.    Discharge Instructions   Discharge Instructions     Diet - low sodium heart healthy   Complete by: As directed    Discharge instructions   Complete by: As directed    Follow heart healthy/modified carbohydrate diet Take medications as prescribed Maintain adequate hydration Resume outpatient hemodialysis as previously scheduled Follow-up with PCP in 10 days Arrange follow-up with urology service as instructed (in 2 weeks)   Discharge wound care:   Complete by: As directed    Clean area and pat dry; constant repositioning and the use of the tegaderm foam for prevention purposes.      Allergies as of 07/20/2021       Reactions   Lisinopril Other (See Comments)   Other reaction(s): Other (See Comments) Mouth,facial swelling  Mouth,facial swelling  Mouth,facial swelling         Medication List     STOP taking these medications    alfuzosin 10 MG 24 hr tablet Commonly known as: UROXATRAL   allopurinol 300 MG tablet Commonly known as: ZYLOPRIM   amLODipine 5 MG tablet Commonly known as: NORVASC   colchicine 0.6 MG tablet   Cyanocobalamin 2000 MCG Tbcr   ELDERBERRY PO   FeroSul 325 (65 FE) MG tablet Generic drug: ferrous sulfate   Ferrex 150 150 MG capsule Generic drug: iron polysaccharides   furosemide 40 MG tablet Commonly known as: LASIX   glipiZIDE 10 MG tablet Commonly known as: GLUCOTROL   hydrALAZINE 100 MG tablet Commonly known as: APRESOLINE   losartan 50 MG tablet Commonly known as: COZAAR   metolazone 2.5 MG tablet Commonly known as: ZAROXOLYN   MULTI-VITAMIN DAILY PO   Omega-3 1000 MG Caps   OneTouch Ultra test strip Generic drug: glucose blood   silodosin 8 MG Caps capsule Commonly known as: RAPAFLO   verapamil 240 MG CR tablet Commonly known as: CALAN-SR       TAKE these medications     acetaminophen 500 MG tablet Commonly known as: TYLENOL Take 500 mg by mouth in the morning and at bedtime.   carvedilol 12.5 MG tablet Commonly known as: COREG   cefdinir 300 MG capsule Commonly known as: OMNICEF Take 1 capsule (300 mg total) by mouth daily for 7 days.   Cholecalciferol 25 MCG (1000 UT) tablet Take by mouth daily.   cycloSPORINE modified 100 MG capsule Commonly known as: NEORAL Take 1 capsule by mouth 2 (two) times daily. What changed: Another medication with the same name was removed. Continue taking this medication, and follow the directions you see here.   diclofenac Sodium 1 % Gel Commonly known as: VOLTAREN Apply topically as needed.   finasteride 5 MG tablet Commonly known as: PROSCAR Take 1 tablet (5 mg total) by mouth daily. Start taking on: July 21, 2021   FreeStyle Libre 14 Day Reader Kerrin Mo See admin instructions.   FreeStyle Libre 14 Day Sensor Misc   insulin glargine 100 UNIT/ML injection Commonly known as: LANTUS Inject 6 Units into the skin at bedtime.   ipratropium-albuterol 0.5-2.5 (3) MG/3ML Soln Commonly known as: DUONEB Inhale into the lungs.   midodrine 5 MG tablet Commonly known as: PROAMATINE Take 1  tablet (5 mg total) by mouth 2 (two) times daily with a meal. Only on HD days.   multivitamin Tabs tablet Take 1 tablet by mouth daily.   mycophenolate 180 MG EC tablet Commonly known as: MYFORTIC Take 1 tablet (180 mg total) by mouth 2 (two) times daily. What changed: how much to take   pantoprazole 40 MG tablet Commonly known as: PROTONIX Take 40 mg by mouth 2 (two) times daily.   tamsulosin 0.4 MG Caps capsule Commonly known as: FLOMAX Take 0.4 mg by mouth 2 (two) times daily.   warfarin 10 MG tablet Commonly known as: COUMADIN Take 6 mg by mouth daily. Take 3mg  every other day and 6mg  on opposite days               Discharge Care Instructions  (From admission, onward)           Start     Ordered    07/20/21 0000  Discharge wound care:       Comments: Clean area and pat dry; constant repositioning and the use of the tegaderm foam for prevention purposes.   07/20/21 1630           Allergies  Allergen Reactions   Lisinopril Other (See Comments)    Other reaction(s): Other (See Comments) Mouth,facial swelling  Mouth,facial swelling  Mouth,facial swelling      Follow-up Information     [facility] Follow up.   Why: Will contact you to schedule home health visits. Contact information: [address] [place] [address] [phone]         Caryl Bis, MD. Schedule an appointment as soon as possible for a visit in 10 day(s).   Specialty: Family Medicine Contact information: [place] [address] [phone]         Cleon Gustin, MD. Schedule an appointment as soon as possible for a visit in 2 week(s).   Specialty: Urology Contact information: [address]  [place] [address] [phone]                 The results of significant diagnostics from this hospitalization (including imaging, microbiology, ancillary and laboratory) are listed below for reference.    Significant Diagnostic Studies: DG Chest Port 1 View  Result Date: 07/17/2021 CLINICAL DATA:  Questionable sepsis. States pt was at dialysis, pt hypotensive upon arrival, became hard to arouse, on oxygen per patient. Sats at 70% on 4L. Afib on monitor hx of COPD and HTN, former smoker. EXAM: PORTABLE CHEST 1 VIEW COMPARISON:  Chest x-ray 05/31/2025. FINDINGS: Right chest wall dialysis catheter upper with tip overlying the expected region of superior caval junction. Cardiac silhouette slightly prominent possibly due to portable AP technique and low lung volumes. The heart size and mediastinal contours are within normal limits. Prominence of the hilar vasculature. Aortic calcification. Low lung volumes. No focal consolidation. Increased interstitial markings.  Bilateral trace to small volume pleural effusions. No pneumothorax. No acute osseous abnormality. IMPRESSION: Slightly prominent cardiac silhouette. Associated pulmonary edema with bilateral trace to small volume pleural effusions. Superimposed infection/inflammation not excluded. Electronically Signed   By: Iven Finn M.D.   On: 07/17/2021 18:20    Microbiology: Recent Results (from the past 240 hour(s))  Resp Panel by RT-PCR (Flu A&B, Covid) Nasopharyngeal Swab     Status: None   Collection Time: 07/17/21  5:38 PM   Specimen: Nasopharyngeal Swab; Nasopharyngeal(NP) swabs in vial transport medium  Result Value Ref  Range Status   SARS Coronavirus 2 by RT PCR NEGATIVE NEGATIVE Final    Comment: (NOTE) SARS-CoV-2 target nucleic acids are NOT DETECTED.  The SARS-CoV-2 RNA is generally detectable in upper respiratory specimens during the acute phase of infection. The lowest concentration of SARS-CoV-2 viral copies this assay can detect is 138 copies/mL. A negative result does not preclude SARS-Cov-2 infection and should not be used as the sole basis for treatment or other patient management decisions. A negative result may occur with  improper specimen collection/handling, submission of specimen other than nasopharyngeal swab, presence of viral mutation(s) within the areas targeted by this assay, and inadequate number of viral copies(<138 copies/mL). A negative result must be combined with clinical observations, patient history, and epidemiological information. The expected result is Negative.  Fact Sheet for Patients:  EntrepreneurPulse.com.au  Fact Sheet for Healthcare Providers:  IncredibleEmployment.be  This test is no t yet approved or cleared by the [place] FDA and  has been authorized for detection and/or diagnosis of SARS-CoV-2 by FDA under an Emergency Use Authorization (EUA). This EUA will remain  in effect (meaning this test can  be used) for the duration of the COVID-19 declaration under Section 564(b)(1) of the Act, 21 U.S.C.section 360bbb-3(b)(1), unless the authorization is terminated  or revoked sooner.       Influenza A by PCR NEGATIVE NEGATIVE Final   Influenza B by PCR NEGATIVE NEGATIVE Final    Comment: (NOTE) The Xpert Xpress SARS-CoV-2/FLU/RSV plus assay is intended as an aid in the diagnosis of influenza from Nasopharyngeal swab specimens and should not be used as a sole basis for treatment. Nasal washings and aspirates are unacceptable for Xpert Xpress SARS-CoV-2/FLU/RSV testing.  Fact Sheet for Patients: EntrepreneurPulse.com.au  Fact Sheet for Healthcare Providers: IncredibleEmployment.be  This test is not yet approved or cleared by the [place] FDA and has been authorized for detection and/or diagnosis of SARS-CoV-2 by FDA under an Emergency Use Authorization (EUA). This EUA will remain in effect (meaning this test can be used) for the duration of the COVID-19 declaration under Section 564(b)(1) of the Act, 21 U.S.C. section 360bbb-3(b)(1), unless the authorization is terminated or revoked.  Performed at [facility], [address] [place], [place] [address]   Blood Culture (routine x 2)     Status: None (Preliminary result)   Collection Time: 07/17/21  6:28 PM   Specimen: BLOOD RIGHT HAND  Result Value Ref Range Status   Specimen Description BLOOD RIGHT HAND  Final   Special Requests   Final    BOTTLES DRAWN AEROBIC AND ANAEROBIC Blood Culture adequate volume   Culture   Final    NO GROWTH 3 DAYS Performed at [facility], [address], [place], [place] [address]    Report Status PENDING  Incomplete  Blood Culture (routine x 2)     Status: None (Preliminary result)   Collection Time: 07/17/21  6:28 PM   Specimen: BLOOD RIGHT FOREARM  Result Value Ref Range Status   Specimen Description BLOOD RIGHT FOREARM  Final   Special Requests    Final    BOTTLES DRAWN AEROBIC AND ANAEROBIC Blood Culture adequate volume   Culture   Final    NO GROWTH 3 DAYS Performed at [facility], [address], [place], [place] [address]    Report Status PENDING  Incomplete  MRSA Next Gen by PCR, Nasal     Status: None   Collection Time: 07/18/21  1:21 AM   Specimen: Nasal Mucosa;  Nasal Swab  Result Value Ref Range Status   MRSA by PCR Next Gen NOT DETECTED NOT DETECTED Final    Comment: (NOTE) The GeneXpert MRSA Assay (FDA approved for NASAL specimens only), is one component of a comprehensive MRSA colonization surveillance program. It is not intended to diagnose MRSA infection nor to guide or monitor treatment for MRSA infections. Test performance is not FDA approved in patients less than 57 years old. Performed at [facility], [address] [place], [place] [address]   Urine Culture     Status: Abnormal   Collection Time: 07/18/21  1:47 AM   Specimen: In/Out Cath Urine  Result Value Ref Range Status   Specimen Description   Final    IN/OUT CATH URINE Performed at [facility], [address], [place], [place] [address]    Special Requests   Final    NONE Performed at [facility], [address], [place], [place] [address]    Culture MULTIPLE SPECIES PRESENT, SUGGEST RECOLLECTION (A)  Final   Report Status 07/19/2021 FINAL  Final     Labs: Basic Metabolic Panel: Recent Labs  Lab 07/17/21 1828 07/18/21 0440 07/19/21 0346 07/20/21 0521  NA 133* 135 135 133*  K 4.2 4.2 3.8 3.9  CL 97* 95* 98 97*  CO2 25 27 28 26   GLUCOSE 214* 147* 71 127*  BUN 37* 46* 33* 47*  CREATININE 3.18* 3.68* 3.14* 4.28*  CALCIUM 8.1* 8.4* 8.5* 7.9*  MG  --  1.7 1.7  --   PHOS  --   --  4.9* 6.3*   Liver Function Tests: Recent Labs  Lab 07/17/21 1828 07/19/21 0346 07/20/21 0521  AST 40  --   --   ALT 20  --   --   ALKPHOS 73  --   --   BILITOT 1.3*  --   --   PROT 6.0*  --   --   ALBUMIN 2.9* 2.8* 2.8*   CBC: Recent Labs  Lab  07/17/21 1828 07/18/21 0440 07/19/21 0346 07/20/21 0521  WBC 9.2 10.8* 6.8 7.3  NEUTROABS 8.4*  --   --   --   HGB 8.6* 8.2* 8.1* 8.5*  HCT 27.5* 26.6* 27.1* 26.8*  MCV 94.8 93.7 94.4 94.0  PLT 240 240 225 223   BNP (last 3 results) Recent Labs    07/17/21 1828  BNP 1,278.0*   CBG: Recent Labs  Lab 07/19/21 1142 07/19/21 1626 07/19/21 2101 07/20/21 0730 07/20/21 1122  GLUCAP 169* 164* 170* 120* 163*   Signed:  Barton Dubois MD.  [facility] 07/20/2021, 4:32 PM

## 2021-07-20 NOTE — Progress Notes (Signed)
BG 59- Hypoglycemic protocol implemented. Pt A&O. VSS and pt following commands/responds appropriately. Pt consumed appropriate carb and protein intake. BG re-checked and WNL at 80. Attending notified face to face. Continue to monitor for now. No new orders at this time.

## 2021-07-20 NOTE — TOC Transition Note (Signed)
Transition of Care [facility]) - CM/SW Discharge Note   Patient Details  Name: Kevin Frey MRN: [id number] Date of Birth: [birth year]  Transition of Care [facility]) CM/SW Contact:  Salome Arnt, [address] Phone Number: 07/20/2021, 4:03 PM   Clinical Narrative:  Anticipate d/c today after dialysis. LCSW notified Santiago Glad with [facility] who will watch for home health orders. MD aware of need for resumption PT/RN orders.      Final next level of care: [place] Barriers to Discharge: Barriers Resolved   Patient Goals and CMS Choice Patient states their goals for this hospitalization and ongoing recovery are:: Return home with [facility] CMS Medicare.gov Compare Post Acute Care list provided to:: Patient Choice offered to / list presented to : Patient, Spouse  Discharge Placement                       Discharge Plan and Services In-house Referral: Clinical Social Work Discharge Planning Services: CM Consult Post Acute Care Choice: Home Health                    HH Arranged: RN, PT [facility] Agency: [place] Date [place]: 07/20/21 Time [place]: 1603 Representative spoke with at [place]: [place] ([place]) Interventions     Readmission Risk Interventions Readmission Risk Prevention Plan 07/18/2021  Transportation Screening Complete  HRI or [place] Complete  Social Work Consult for [place] Planning/Counseling Complete  Palliative Care Screening Not Applicable  Medication Review Press photographer) Complete  Some recent data might be hidden

## 2021-07-20 NOTE — Progress Notes (Signed)
Subjective:  HD yest off schedule-  removed 3000-  tolerated well-  still req 3 liters of O2 this AM-  BP overall better/higher-  150 of UOP and crt climbed over 1.0 from yesterday to today -    he wants to go home  Objective Vital signs in last 24 hours: Vitals:   07/20/21 0400 07/20/21 0500 07/20/21 0600 07/20/21 0700  BP: 129/73 (!) 153/58    Pulse: 69 78 69   Resp: 15 13 18    Temp:  98 F (36.7 C)  97.7 F (36.5 C)  TempSrc:  Oral  Oral  SpO2: 99% 97% 100%   Weight:  80.8 kg    Height:       Weight change: -1.6 kg  Intake/Output Summary (Last 24 hours) at 07/20/2021 0844 Last data filed at 07/19/2021 1400 Gross per 24 hour  Intake --  Output 150 ml  Net -150 ml    Dialyzes at [facility]   MWF  EDW 80.5. 4 hours HD Bath 2/2.5, Dialyzer Rev 300, Heparin none. Access TDC-  300 BFR.  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  HD Tues off schedule due to hypoxia and volume overload-  using albumin and midodrine to remove volume.  Will plan on HD today again off schedule.   3 Hypertension: BP soft, reportedly on norvasc, coreg, verapamil, cozaar  and hydralazine ?  as OP, also rapaflo and flomax-  all on hold now except flomax-  challenge EDW as able, still with volume on board-  started midodrine-  increased to max but possibly will only need pre HD at discharge 4. Anemia of ESRD: will continue with ESA - hold iron given scenario 5. Metabolic Bone Disease: reportedly on no meds for this as OP-  will check calc and phos and act as needed -  are WNL 6.  Urinary retention-  urine looks purulent - on rocephin.  foley maybe was changed here ??  Is established with urology  7. S/p renal transplant -  remains on IS meds-  dec myfortic as above -  would consider weaning IS meds further in OP setting as I feel that he is now ESRD   Louis Meckel    Labs: Basic Metabolic Panel: Recent Labs  Lab 07/18/21 0440 07/19/21 0346 07/20/21 0521  NA 135 135 133*  K 4.2 3.8 3.9  CL 95* 98 97*  CO2 27 28 26   GLUCOSE 147* 71 127*  BUN 46* 33* 47*  CREATININE 3.68* 3.14* 4.28*  CALCIUM 8.4* 8.5* 7.9*  PHOS  --  4.9* 6.3*   Liver Function Tests: Recent Labs  Lab 07/17/21 1828 07/19/21 0346 07/20/21 0521  AST 40  --   --   ALT 20  --   --  ALKPHOS 73  --   --   BILITOT 1.3*  --   --   PROT 6.0*  --   --   ALBUMIN 2.9* 2.8* 2.8*   No results for input(s): LIPASE, AMYLASE in the last 168 hours. No results for input(s): AMMONIA in the last 168 hours. CBC: Recent Labs  Lab 07/17/21 1828 07/18/21 0440 07/19/21 0346 07/20/21 0521  WBC 9.2 10.8* 6.8 7.3  NEUTROABS 8.4*  --   --   --   HGB 8.6* 8.2* 8.1* 8.5*  HCT 27.5* 26.6* 27.1* 26.8*  MCV 94.8 93.7 94.4 94.0  PLT 240 240 225 223   Cardiac Enzymes: No results for input(s): CKTOTAL, CKMB, CKMBINDEX, TROPONINI in the last 168 hours. CBG: Recent Labs  Lab 07/19/21 0744 07/19/21 1142 07/19/21 1626 07/19/21 2101 07/20/21 0730  GLUCAP 80 169* 164* 170* 120*    Iron Studies: No results for input(s): IRON, TIBC, TRANSFERRIN, FERRITIN in the last 72 hours. Studies/Results: No results found. Medications: Infusions:  sodium chloride     sodium chloride     cefTRIAXone (ROCEPHIN)  IV 1 g (07/19/21 1000)    Scheduled  Medications:  carvedilol  12.5 mg Oral BID WC   Chlorhexidine Gluconate Cloth  6 each Topical Q0600   Chlorhexidine Gluconate Cloth  6 each Topical Q0600   cycloSPORINE modified  100 mg Oral BID   darbepoetin (ARANESP) injection - DIALYSIS  150 mcg Intravenous Q Wed-HD   finasteride  5 mg Oral Daily   insulin aspart  0-6 Units Subcutaneous TID WC   insulin glargine-yfgn  4 Units Subcutaneous QHS   midodrine  5 mg Oral TID WC   mycophenolate  180 mg Oral BID   pantoprazole  40 mg Oral BID   tamsulosin  0.4 mg Oral BID   warfarin  4 mg Oral ONCE-1600   Warfarin - Pharmacist Dosing Inpatient   Does not apply q1600    have reviewed scheduled and prn medications.  Physical Exam: General:  alert, seems better again  Heart: RRR Lungs: dec BS at bases Abdomen: soft, non tender Extremities: pitting edema Dialysis Access: TDC-  no obvious infection    07/20/2021,8:44 AM  LOS: 3 days

## 2021-07-20 NOTE — Progress Notes (Signed)
At shift report, noticed patient had discharge orders. Patients spouse is requesting patient be discharged in the morning as she does not feel all of her questions have been answered by the doctor, and wants to speak with them in the morning to have a clear plan for discharge. Spouse also states being late in the day post dialysis patient is weak/tired and may have trouble getting patient in the house this evening due to this. Paged hospitalist coverage about this issue and communicated this to the spouse who states understanding.

## 2021-07-20 NOTE — Procedures (Signed)
   HEMODIALYSIS TREATMENT NOTE:  4 hour low-heparin HD completed via RIJ TDC. Goal met: 3.5 liters removed.  No interruption in UF.  Albumin 25g was given twice.  Midodrine given pre-HD.  All blood was returned.  Rockwell Alexandria, RN

## 2021-07-21 DIAGNOSIS — Z992 Dependence on renal dialysis: Secondary | ICD-10-CM | POA: Diagnosis not present

## 2021-07-21 DIAGNOSIS — E7849 Other hyperlipidemia: Secondary | ICD-10-CM | POA: Diagnosis not present

## 2021-07-21 DIAGNOSIS — I10 Essential (primary) hypertension: Secondary | ICD-10-CM | POA: Diagnosis not present

## 2021-07-21 DIAGNOSIS — E1122 Type 2 diabetes mellitus with diabetic chronic kidney disease: Secondary | ICD-10-CM | POA: Diagnosis not present

## 2021-07-21 DIAGNOSIS — I4891 Unspecified atrial fibrillation: Secondary | ICD-10-CM | POA: Diagnosis not present

## 2021-07-21 LAB — RENAL FUNCTION PANEL
Albumin: 3.2 g/dL — ABNORMAL LOW (ref 3.5–5.0)
Anion gap: 9 (ref 5–15)
BUN: 29 mg/dL — ABNORMAL HIGH (ref 8–23)
CO2: 29 mmol/L (ref 22–32)
Calcium: 8.3 mg/dL — ABNORMAL LOW (ref 8.9–10.3)
Chloride: 94 mmol/L — ABNORMAL LOW (ref 98–111)
Creatinine, Ser: 2.92 mg/dL — ABNORMAL HIGH (ref 0.61–1.24)
GFR, Estimated: 21 mL/min — ABNORMAL LOW (ref >60)
Glucose, Bld: 125 mg/dL — ABNORMAL HIGH (ref 70–99)
Phosphorus: 4.6 mg/dL (ref 2.5–4.6)
Potassium: 3.7 mmol/L (ref 3.5–5.1)
Sodium: 132 mmol/L — ABNORMAL LOW (ref 135–145)

## 2021-07-21 LAB — PROTIME-INR
INR: 1.6 — ABNORMAL HIGH (ref 0.8–1.2)
Prothrombin Time: 18.6 seconds — ABNORMAL HIGH (ref 11.4–15.2)

## 2021-07-21 LAB — GLUCOSE, CAPILLARY
Glucose-Capillary: 124 mg/dL — ABNORMAL HIGH (ref 70–99)
Glucose-Capillary: 220 mg/dL — ABNORMAL HIGH (ref 70–99)

## 2021-07-21 MED ORDER — WARFARIN SODIUM 5 MG PO TABS: 6.0000 mg | ORAL_TABLET | Freq: Once | ORAL | Status: DC

## 2021-07-21 MED ORDER — IPRATROPIUM-ALBUTEROL 0.5-2.5 (3) MG/3ML IN SOLN: 3.0000 mL | Freq: Four times a day (QID) | RESPIRATORY_TRACT | 2 refills | Status: AC | PRN

## 2021-07-21 MED ORDER — CARVEDILOL 12.5 MG PO TABS: 12.5000 mg | ORAL_TABLET | Freq: Two times a day (BID) | ORAL | 1 refills | Status: DC

## 2021-07-21 NOTE — Progress Notes (Signed)
Subjective:  HD yest off schedule-  removed 3500 -  had one dip to 419 systolic- back up to 622/29 this AM-  plan for discharge today ??  Objective Vital signs in last 24 hours: Vitals:   07/21/21 0500 07/21/21 0600 07/21/21 0650 07/21/21 0700  BP:   (!) 153/89   Pulse:  79 79   Resp:  17 19   Temp: 97.6 F (36.4 C)   97.8 F (36.6 C)  TempSrc: Oral   Oral  SpO2:  100% 100%   Weight: 80 kg     Height:       Weight change: 0 kg  Intake/Output Summary (Last 24 hours) at 07/21/2021 0755 Last data filed at 07/20/2021 1635 Gross per 24 hour  Intake 680 ml  Output 100 ml  Net 580 ml    Dialyzes at [facility]   MWF  EDW 80.5. 4 hours HD Bath 2/2.5, Dialyzer Rev 300, Heparin none. Access TDC-  300 BFR.  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  HD Tues and Thurs off schedule -  extra treatment this week due to hypoxia and volume overload-  using albumin and midodrine to remove volume.  If leaving today, I will check wth [facility] to see if they can run him tomorrow, then Monday to get back on schedule  -- they said that they could and will call wife with treatment time for tomorrow  3 Hypertension: BP soft, reportedly on norvasc, coreg, verapamil, cozaar  and hydralazine ?  as OP, also rapaflo and flomax-  all  on hold now except flomax-  challenge EDW as able, still with volume on board-  started midodrine- increased to max but possibly will only need pre HD at discharge-  ok for midodrine only pre HD and will need lower EDW at discharge-  I have communicated this to the PA at [facility]  4. Anemia of ESRD: will continue with ESA - hold iron given scenario 5. Metabolic Bone Disease: reportedly on no meds for this as OP-  will check calc and phos and act as needed -  are WNL 6.  Urinary retention-  urine looks purulent - on rocephin.  foley maybe was changed here ??  Is established with urology  7. S/p renal transplant -  remains on IS meds-  dec myfortic as above -  would consider weaning IS meds further in OP setting as I feel that he is now ESRD   Louis Meckel    Labs: Basic Metabolic Panel: Recent Labs  Lab 07/19/21 0346 07/20/21 0521 07/21/21 0421  NA 135 133* 132*  K 3.8 3.9 3.7  CL 98 97* 94*  CO2 28 26 29   GLUCOSE 71 127* 125*  BUN  33* 47* 29*  CREATININE 3.14* 4.28* 2.92*  CALCIUM 8.5* 7.9* 8.3*  PHOS 4.9* 6.3* 4.6   Liver Function Tests: Recent Labs  Lab 07/17/21 1828 07/19/21 0346 07/20/21 0521 07/21/21 0421  AST 40  --   --   --   ALT 20  --   --   --   ALKPHOS 73  --   --   --   BILITOT 1.3*  --   --   --   PROT 6.0*  --   --   --   ALBUMIN 2.9* 2.8* 2.8* 3.2*   No results for input(s): LIPASE, AMYLASE in the last 168 hours. No results for input(s): AMMONIA in the last 168 hours. CBC: Recent Labs  Lab 07/17/21 1828 07/18/21 0440 07/19/21 0346 07/20/21 0521  WBC 9.2 10.8* 6.8 7.3  NEUTROABS 8.4*  --   --   --   HGB 8.6* 8.2* 8.1* 8.5*  HCT 27.5* 26.6* 27.1* 26.8*  MCV 94.8 93.7 94.4 94.0  PLT 240 240 225 223   Cardiac Enzymes: No results for input(s): CKTOTAL, CKMB, CKMBINDEX, TROPONINI in the last 168 hours. CBG: Recent Labs  Lab 07/20/21 0730 07/20/21 1122 07/20/21 1736 07/20/21 2105 07/21/21 0738  GLUCAP 120* 163* 115* 184* 124*     Iron Studies: No results for input(s): IRON, TIBC, TRANSFERRIN, FERRITIN in the last 72 hours. Studies/Results: No results found. Medications: Infusions:  sodium chloride     sodium chloride     albumin human 25 g (07/20/21 1433)   cefTRIAXone (ROCEPHIN)  IV Stopped (07/20/21 1123)    Scheduled Medications:  carvedilol  12.5 mg Oral BID WC   Chlorhexidine Gluconate Cloth  6 each Topical Q0600   Chlorhexidine Gluconate Cloth  6 each Topical Q0600   cycloSPORINE modified  100 mg Oral BID   darbepoetin (ARANESP) injection - DIALYSIS  150 mcg Intravenous Q Wed-HD   finasteride  5 mg Oral Daily   insulin aspart  0-6 Units Subcutaneous TID WC   insulin glargine-yfgn  4 Units Subcutaneous QHS   midodrine  5 mg Oral TID WC   mycophenolate  180 mg Oral BID   pantoprazole  40 mg Oral BID   tamsulosin  0.4 mg Oral BID   Warfarin - Pharmacist Dosing Inpatient   Does not apply q1600    have reviewed scheduled and prn medications.  Physical Exam: General:  alert, seems better again  Heart: RRR Lungs: dec BS at bases Abdomen: soft, non tender Extremities: pitting edema Dialysis Access: TDC-  no obvious infection    07/21/2021,7:55 AM  LOS: 4 days

## 2021-07-21 NOTE — Progress Notes (Signed)
ANTICOAGULATION CONSULT NOTE -   Pharmacy Consult for Warfarin Indication: atrial fibrillation  Allergies  Allergen Reactions   Lisinopril Other (See Comments)    Other reaction(s): Other (See Comments) Mouth,facial swelling  Mouth,facial swelling  Mouth,facial swelling      Patient Measurements: Height: 5\' 10"  (177.8 cm) Weight: 80 kg (176 lb 5.9 oz) IBW/kg (Calculated) : 73   Vital Signs: Temp: 97.8 F (36.6 C) (08/19 0700) Temp Source: Oral (08/19 0700) BP: 153/89 (08/19 0650) Pulse Rate: 79 (08/19 0650)  Labs: Recent Labs    07/19/21 0346 07/20/21 0521 07/21/21 0421  HGB 8.1* 8.5*  --   HCT 27.1* 26.8*  --   PLT 225 223  --   LABPROT 17.6* 18.8* 18.6*  INR 1.5* 1.6* 1.6*  CREATININE 3.14* 4.28* 2.92*     Estimated Creatinine Clearance: 20.5 mL/min (A) (by C-G formula based on SCr of 2.92 mg/dL (H)).   Medical History: Past Medical History:  Diagnosis Date   Acute ischemic vertebrobasilar artery thalamic stroke ([place]) 2015   Atrial fibrillation (HCC)    On Coumadin   Basal cell carcinoma 01/08/2017   right forearm ( txpbx)   COPD (chronic obstructive pulmonary disease) (HCC)    Deceased-donor kidney transplant 2015   Diabetic nephropathy (HCC)    End stage renal disease (HCC)    FSGS (focal segmental glomerulosclerosis)    HLD (hyperlipidemia)    HTN (hypertension)    Long-term use of immunosuppressant medication    SCC (squamous cell carcinoma) 05/10/2015   right side of face ([id number])   SCC (squamous cell carcinoma) 04/25/2016   right post scalp ( [id number])   SCC (squamous cell carcinoma) 08/27/2017   right post scalp (txpbx) insitu   SCC (squamous cell carcinoma) 05/05/2018   back of scalp (mohs) insitu   SCC (squamous cell carcinoma) 10/21/2018   top of right hand (txpbx) well diff   SCC (squamous cell carcinoma) 10/21/2018   back of crown (txpbx) insitu   SCC (squamous cell carcinoma) 06/18/2019   back of crown ( cx57fu) insitu   Squamous  cell carcinoma of skin 05/10/2015   right crown insitu (cx45fu)   Type 2 diabetes mellitus (HCC)     Assessment: 80 yo M on warfarin for afib in the setting of ESRD on dialysis and hx CVA. Noted recent upper GIB due to duodenal ulcer in the setting of INR 5.8. Apixaban was attempted 06/08/21 but stopped due to progressive anemia and melena resulting in hemorrhagic shock. A total of 9 units RBC were given during this July 2022 admission at [facility]. [name]   PTA warfarin dose 3mg  every other day alternating with 6mg . Last dose was 3mg  on 8/14. (Has 6mg  tablets)  INR 1.5> 1.6   Goal of Therapy:  INR 2-3 >> ideally 2- 2.5 given recent severe bleed and duodenal ulcer Monitor platelets by anticoagulation protocol: Yes   Plan:  Warfarin 6 mg x 1 dose Monitor daily INR, CBC/plt Monitor for signs/symptoms of bleeding    Margot Ables, PharmD Clinical Pharmacist 07/21/2021 8:03 AM

## 2021-07-21 NOTE — Progress Notes (Signed)
Patient seen and examined. In not acute distress, reports feeling good and is hemodynamically stable for discharge. HD treatment happened too late on 07/20/21 and patient's wife felt patient was unsafe to go home that late as he becomes significantly weak after HD treatment. Nephrology has make arrangements for HD off schedule on 07/22/21 and will contact wife with time for outpatient treatment. Discharge summary written on 07/20/21 has been reviewed and not changes needed, please refer to it for further info/details.  Barton Dubois MD [phone]

## 2021-07-21 NOTE — Progress Notes (Signed)
Discharge paperwork and instructions provided to patient and spouse. Patient and spouse expressed full understanding of information given. Peripheral IV removed. Patient dressed in personal clothing. All belongings gathered and returned. Patient transported via wheelchair with NT to main entrance.

## 2021-07-22 DIAGNOSIS — D509 Iron deficiency anemia, unspecified: Secondary | ICD-10-CM | POA: Diagnosis not present

## 2021-07-22 DIAGNOSIS — D649 Anemia, unspecified: Secondary | ICD-10-CM | POA: Diagnosis not present

## 2021-07-22 DIAGNOSIS — J449 Chronic obstructive pulmonary disease, unspecified: Secondary | ICD-10-CM | POA: Diagnosis not present

## 2021-07-22 DIAGNOSIS — I482 Chronic atrial fibrillation, unspecified: Secondary | ICD-10-CM | POA: Diagnosis not present

## 2021-07-22 DIAGNOSIS — Z23 Encounter for immunization: Secondary | ICD-10-CM | POA: Diagnosis not present

## 2021-07-22 DIAGNOSIS — E1122 Type 2 diabetes mellitus with diabetic chronic kidney disease: Secondary | ICD-10-CM | POA: Diagnosis not present

## 2021-07-22 DIAGNOSIS — N179 Acute kidney failure, unspecified: Secondary | ICD-10-CM | POA: Diagnosis not present

## 2021-07-22 DIAGNOSIS — E559 Vitamin D deficiency, unspecified: Secondary | ICD-10-CM | POA: Diagnosis not present

## 2021-07-22 DIAGNOSIS — N186 End stage renal disease: Secondary | ICD-10-CM | POA: Diagnosis not present

## 2021-07-22 DIAGNOSIS — D631 Anemia in chronic kidney disease: Secondary | ICD-10-CM | POA: Diagnosis not present

## 2021-07-22 DIAGNOSIS — N189 Chronic kidney disease, unspecified: Secondary | ICD-10-CM | POA: Diagnosis not present

## 2021-07-22 DIAGNOSIS — I120 Hypertensive chronic kidney disease with stage 5 chronic kidney disease or end stage renal disease: Secondary | ICD-10-CM | POA: Diagnosis not present

## 2021-07-22 LAB — CULTURE, BLOOD (ROUTINE X 2)
Culture: NO GROWTH
Culture: NO GROWTH
Special Requests: ADEQUATE
Special Requests: ADEQUATE

## 2021-07-24 DIAGNOSIS — D649 Anemia, unspecified: Secondary | ICD-10-CM | POA: Diagnosis not present

## 2021-07-24 DIAGNOSIS — N179 Acute kidney failure, unspecified: Secondary | ICD-10-CM | POA: Diagnosis not present

## 2021-07-24 DIAGNOSIS — E559 Vitamin D deficiency, unspecified: Secondary | ICD-10-CM | POA: Diagnosis not present

## 2021-07-24 DIAGNOSIS — Z23 Encounter for immunization: Secondary | ICD-10-CM | POA: Diagnosis not present

## 2021-07-24 DIAGNOSIS — D509 Iron deficiency anemia, unspecified: Secondary | ICD-10-CM | POA: Diagnosis not present

## 2021-07-24 DIAGNOSIS — N189 Chronic kidney disease, unspecified: Secondary | ICD-10-CM | POA: Diagnosis not present

## 2021-07-25 DIAGNOSIS — J9611 Chronic respiratory failure with hypoxia: Secondary | ICD-10-CM | POA: Diagnosis not present

## 2021-07-25 DIAGNOSIS — N390 Urinary tract infection, site not specified: Secondary | ICD-10-CM | POA: Diagnosis not present

## 2021-07-25 DIAGNOSIS — I4821 Permanent atrial fibrillation: Secondary | ICD-10-CM | POA: Diagnosis not present

## 2021-07-25 DIAGNOSIS — A4150 Gram-negative sepsis, unspecified: Secondary | ICD-10-CM | POA: Diagnosis not present

## 2021-07-25 DIAGNOSIS — N185 Chronic kidney disease, stage 5: Secondary | ICD-10-CM | POA: Diagnosis not present

## 2021-07-25 DIAGNOSIS — J449 Chronic obstructive pulmonary disease, unspecified: Secondary | ICD-10-CM | POA: Diagnosis not present

## 2021-07-25 DIAGNOSIS — I700 Atherosclerosis of aorta: Secondary | ICD-10-CM | POA: Diagnosis not present

## 2021-07-26 ENCOUNTER — Telehealth: Payer: Self-pay

## 2021-07-26 DIAGNOSIS — D649 Anemia, unspecified: Secondary | ICD-10-CM | POA: Diagnosis not present

## 2021-07-26 DIAGNOSIS — Z992 Dependence on renal dialysis: Secondary | ICD-10-CM | POA: Diagnosis not present

## 2021-07-26 DIAGNOSIS — N179 Acute kidney failure, unspecified: Secondary | ICD-10-CM | POA: Diagnosis not present

## 2021-07-26 DIAGNOSIS — N186 End stage renal disease: Secondary | ICD-10-CM | POA: Diagnosis not present

## 2021-07-26 DIAGNOSIS — D631 Anemia in chronic kidney disease: Secondary | ICD-10-CM | POA: Diagnosis not present

## 2021-07-26 DIAGNOSIS — D509 Iron deficiency anemia, unspecified: Secondary | ICD-10-CM | POA: Diagnosis not present

## 2021-07-26 NOTE — Telephone Encounter (Signed)
Appeals letter sent to [facility] appeals dept for denial of Silodosin

## 2021-07-27 DIAGNOSIS — I482 Chronic atrial fibrillation, unspecified: Secondary | ICD-10-CM | POA: Diagnosis not present

## 2021-07-27 DIAGNOSIS — I10 Essential (primary) hypertension: Secondary | ICD-10-CM | POA: Diagnosis not present

## 2021-07-27 DIAGNOSIS — I120 Hypertensive chronic kidney disease with stage 5 chronic kidney disease or end stage renal disease: Secondary | ICD-10-CM | POA: Diagnosis not present

## 2021-07-27 DIAGNOSIS — I5032 Chronic diastolic (congestive) heart failure: Secondary | ICD-10-CM | POA: Diagnosis not present

## 2021-07-27 DIAGNOSIS — D631 Anemia in chronic kidney disease: Secondary | ICD-10-CM | POA: Diagnosis not present

## 2021-07-27 DIAGNOSIS — I4821 Permanent atrial fibrillation: Secondary | ICD-10-CM | POA: Diagnosis not present

## 2021-07-27 DIAGNOSIS — J449 Chronic obstructive pulmonary disease, unspecified: Secondary | ICD-10-CM | POA: Diagnosis not present

## 2021-07-27 DIAGNOSIS — E1122 Type 2 diabetes mellitus with diabetic chronic kidney disease: Secondary | ICD-10-CM | POA: Diagnosis not present

## 2021-07-27 DIAGNOSIS — N186 End stage renal disease: Secondary | ICD-10-CM | POA: Diagnosis not present

## 2021-07-27 DIAGNOSIS — I350 Nonrheumatic aortic (valve) stenosis: Secondary | ICD-10-CM | POA: Diagnosis not present

## 2021-07-28 DIAGNOSIS — D509 Iron deficiency anemia, unspecified: Secondary | ICD-10-CM | POA: Diagnosis not present

## 2021-07-28 DIAGNOSIS — D649 Anemia, unspecified: Secondary | ICD-10-CM | POA: Diagnosis not present

## 2021-07-28 DIAGNOSIS — Z992 Dependence on renal dialysis: Secondary | ICD-10-CM | POA: Diagnosis not present

## 2021-07-28 DIAGNOSIS — N179 Acute kidney failure, unspecified: Secondary | ICD-10-CM | POA: Diagnosis not present

## 2021-07-28 DIAGNOSIS — D631 Anemia in chronic kidney disease: Secondary | ICD-10-CM | POA: Diagnosis not present

## 2021-07-28 DIAGNOSIS — N186 End stage renal disease: Secondary | ICD-10-CM | POA: Diagnosis not present

## 2021-07-31 DIAGNOSIS — X58XXXA Exposure to other specified factors, initial encounter: Secondary | ICD-10-CM | POA: Diagnosis not present

## 2021-07-31 DIAGNOSIS — R0902 Hypoxemia: Secondary | ICD-10-CM | POA: Diagnosis not present

## 2021-07-31 DIAGNOSIS — R55 Syncope and collapse: Secondary | ICD-10-CM | POA: Diagnosis not present

## 2021-07-31 DIAGNOSIS — N179 Acute kidney failure, unspecified: Secondary | ICD-10-CM | POA: Diagnosis not present

## 2021-07-31 DIAGNOSIS — J90 Pleural effusion, not elsewhere classified: Secondary | ICD-10-CM | POA: Diagnosis not present

## 2021-07-31 DIAGNOSIS — M109 Gout, unspecified: Secondary | ICD-10-CM | POA: Diagnosis not present

## 2021-07-31 DIAGNOSIS — I4891 Unspecified atrial fibrillation: Secondary | ICD-10-CM | POA: Diagnosis not present

## 2021-07-31 DIAGNOSIS — E1122 Type 2 diabetes mellitus with diabetic chronic kidney disease: Secondary | ICD-10-CM | POA: Diagnosis not present

## 2021-07-31 DIAGNOSIS — I517 Cardiomegaly: Secondary | ICD-10-CM | POA: Diagnosis not present

## 2021-07-31 DIAGNOSIS — J9811 Atelectasis: Secondary | ICD-10-CM | POA: Diagnosis not present

## 2021-07-31 DIAGNOSIS — I120 Hypertensive chronic kidney disease with stage 5 chronic kidney disease or end stage renal disease: Secondary | ICD-10-CM | POA: Diagnosis not present

## 2021-07-31 DIAGNOSIS — Z66 Do not resuscitate: Secondary | ICD-10-CM | POA: Diagnosis not present

## 2021-07-31 DIAGNOSIS — S51802A Unspecified open wound of left forearm, initial encounter: Secondary | ICD-10-CM | POA: Diagnosis not present

## 2021-07-31 DIAGNOSIS — G4730 Sleep apnea, unspecified: Secondary | ICD-10-CM | POA: Diagnosis not present

## 2021-07-31 DIAGNOSIS — R9389 Abnormal findings on diagnostic imaging of other specified body structures: Secondary | ICD-10-CM | POA: Diagnosis not present

## 2021-07-31 DIAGNOSIS — Z992 Dependence on renal dialysis: Secondary | ICD-10-CM | POA: Diagnosis not present

## 2021-07-31 DIAGNOSIS — D509 Iron deficiency anemia, unspecified: Secondary | ICD-10-CM | POA: Diagnosis not present

## 2021-07-31 DIAGNOSIS — J0100 Acute maxillary sinusitis, unspecified: Secondary | ICD-10-CM | POA: Diagnosis not present

## 2021-07-31 DIAGNOSIS — J449 Chronic obstructive pulmonary disease, unspecified: Secondary | ICD-10-CM | POA: Diagnosis not present

## 2021-07-31 DIAGNOSIS — M1991 Primary osteoarthritis, unspecified site: Secondary | ICD-10-CM | POA: Diagnosis not present

## 2021-07-31 DIAGNOSIS — N186 End stage renal disease: Secondary | ICD-10-CM | POA: Diagnosis not present

## 2021-07-31 DIAGNOSIS — H9190 Unspecified hearing loss, unspecified ear: Secondary | ICD-10-CM | POA: Diagnosis not present

## 2021-07-31 DIAGNOSIS — R609 Edema, unspecified: Secondary | ICD-10-CM | POA: Diagnosis not present

## 2021-07-31 DIAGNOSIS — E785 Hyperlipidemia, unspecified: Secondary | ICD-10-CM | POA: Diagnosis not present

## 2021-07-31 DIAGNOSIS — Z20822 Contact with and (suspected) exposure to covid-19: Secondary | ICD-10-CM | POA: Diagnosis not present

## 2021-07-31 DIAGNOSIS — D649 Anemia, unspecified: Secondary | ICD-10-CM | POA: Diagnosis not present

## 2021-07-31 DIAGNOSIS — R9431 Abnormal electrocardiogram [ECG] [EKG]: Secondary | ICD-10-CM | POA: Diagnosis not present

## 2021-07-31 DIAGNOSIS — G319 Degenerative disease of nervous system, unspecified: Secondary | ICD-10-CM | POA: Diagnosis not present

## 2021-07-31 DIAGNOSIS — R4182 Altered mental status, unspecified: Secondary | ICD-10-CM | POA: Diagnosis not present

## 2021-07-31 DIAGNOSIS — D631 Anemia in chronic kidney disease: Secondary | ICD-10-CM | POA: Diagnosis not present

## 2021-07-31 DIAGNOSIS — E1140 Type 2 diabetes mellitus with diabetic neuropathy, unspecified: Secondary | ICD-10-CM | POA: Diagnosis not present

## 2021-07-31 DIAGNOSIS — S51812A Laceration without foreign body of left forearm, initial encounter: Secondary | ICD-10-CM | POA: Diagnosis not present

## 2021-08-02 ENCOUNTER — Emergency Department (HOSPITAL_COMMUNITY): Payer: Medicare Other

## 2021-08-02 ENCOUNTER — Encounter (HOSPITAL_COMMUNITY): Payer: Self-pay

## 2021-08-02 ENCOUNTER — Other Ambulatory Visit: Payer: Self-pay

## 2021-08-02 ENCOUNTER — Inpatient Hospital Stay (HOSPITAL_COMMUNITY)
Admission: EM | Admit: 2021-08-02 | Discharge: 2021-08-06 | DRG: 312 | Disposition: A | Discharge status: Home Health | Payer: Medicare Other | Attending: Internal Medicine | Admitting: Internal Medicine

## 2021-08-02 DIAGNOSIS — J449 Chronic obstructive pulmonary disease, unspecified: Secondary | ICD-10-CM | POA: Diagnosis present

## 2021-08-02 DIAGNOSIS — R404 Transient alteration of awareness: Secondary | ICD-10-CM | POA: Diagnosis not present

## 2021-08-02 DIAGNOSIS — N186 End stage renal disease: Secondary | ICD-10-CM | POA: Diagnosis not present

## 2021-08-02 DIAGNOSIS — R531 Weakness: Secondary | ICD-10-CM | POA: Diagnosis not present

## 2021-08-02 DIAGNOSIS — Z79899 Other long term (current) drug therapy: Secondary | ICD-10-CM

## 2021-08-02 DIAGNOSIS — Z87891 Personal history of nicotine dependence: Secondary | ICD-10-CM

## 2021-08-02 DIAGNOSIS — R8271 Bacteriuria: Secondary | ICD-10-CM | POA: Diagnosis present

## 2021-08-02 DIAGNOSIS — Z8673 Personal history of transient ischemic attack (TIA), and cerebral infarction without residual deficits: Secondary | ICD-10-CM

## 2021-08-02 DIAGNOSIS — Z85828 Personal history of other malignant neoplasm of skin: Secondary | ICD-10-CM

## 2021-08-02 DIAGNOSIS — I951 Orthostatic hypotension: Secondary | ICD-10-CM | POA: Diagnosis not present

## 2021-08-02 DIAGNOSIS — I482 Chronic atrial fibrillation, unspecified: Secondary | ICD-10-CM | POA: Diagnosis present

## 2021-08-02 DIAGNOSIS — R55 Syncope and collapse: Secondary | ICD-10-CM

## 2021-08-02 DIAGNOSIS — D631 Anemia in chronic kidney disease: Secondary | ICD-10-CM | POA: Diagnosis not present

## 2021-08-02 DIAGNOSIS — G238 Other specified degenerative diseases of basal ganglia: Secondary | ICD-10-CM | POA: Diagnosis not present

## 2021-08-02 DIAGNOSIS — E8809 Other disorders of plasma-protein metabolism, not elsewhere classified: Secondary | ICD-10-CM | POA: Diagnosis present

## 2021-08-02 DIAGNOSIS — R0689 Other abnormalities of breathing: Secondary | ICD-10-CM | POA: Diagnosis not present

## 2021-08-02 DIAGNOSIS — D649 Anemia, unspecified: Secondary | ICD-10-CM | POA: Diagnosis not present

## 2021-08-02 DIAGNOSIS — N400 Enlarged prostate without lower urinary tract symptoms: Secondary | ICD-10-CM | POA: Diagnosis present

## 2021-08-02 DIAGNOSIS — R0602 Shortness of breath: Secondary | ICD-10-CM | POA: Diagnosis not present

## 2021-08-02 DIAGNOSIS — Z992 Dependence on renal dialysis: Secondary | ICD-10-CM

## 2021-08-02 DIAGNOSIS — J329 Chronic sinusitis, unspecified: Secondary | ICD-10-CM | POA: Diagnosis present

## 2021-08-02 DIAGNOSIS — E1122 Type 2 diabetes mellitus with diabetic chronic kidney disease: Secondary | ICD-10-CM | POA: Diagnosis present

## 2021-08-02 DIAGNOSIS — Z8711 Personal history of peptic ulcer disease: Secondary | ICD-10-CM

## 2021-08-02 DIAGNOSIS — I493 Ventricular premature depolarization: Secondary | ICD-10-CM | POA: Diagnosis present

## 2021-08-02 DIAGNOSIS — N179 Acute kidney failure, unspecified: Secondary | ICD-10-CM | POA: Diagnosis not present

## 2021-08-02 DIAGNOSIS — Z955 Presence of coronary angioplasty implant and graft: Secondary | ICD-10-CM

## 2021-08-02 DIAGNOSIS — I350 Nonrheumatic aortic (valve) stenosis: Secondary | ICD-10-CM

## 2021-08-02 DIAGNOSIS — D84821 Immunodeficiency due to drugs: Secondary | ICD-10-CM | POA: Diagnosis not present

## 2021-08-02 DIAGNOSIS — J9611 Chronic respiratory failure with hypoxia: Secondary | ICD-10-CM | POA: Diagnosis present

## 2021-08-02 DIAGNOSIS — R823 Hemoglobinuria: Secondary | ICD-10-CM | POA: Diagnosis present

## 2021-08-02 DIAGNOSIS — S0990XA Unspecified injury of head, initial encounter: Secondary | ICD-10-CM | POA: Diagnosis not present

## 2021-08-02 DIAGNOSIS — I4821 Permanent atrial fibrillation: Secondary | ICD-10-CM | POA: Diagnosis present

## 2021-08-02 DIAGNOSIS — R4020 Unspecified coma: Secondary | ICD-10-CM | POA: Diagnosis not present

## 2021-08-02 DIAGNOSIS — Z7901 Long term (current) use of anticoagulants: Secondary | ICD-10-CM

## 2021-08-02 DIAGNOSIS — Z20822 Contact with and (suspected) exposure to covid-19: Secondary | ICD-10-CM | POA: Diagnosis not present

## 2021-08-02 DIAGNOSIS — E785 Hyperlipidemia, unspecified: Secondary | ICD-10-CM | POA: Diagnosis present

## 2021-08-02 DIAGNOSIS — R1310 Dysphagia, unspecified: Secondary | ICD-10-CM | POA: Diagnosis present

## 2021-08-02 DIAGNOSIS — I120 Hypertensive chronic kidney disease with stage 5 chronic kidney disease or end stage renal disease: Secondary | ICD-10-CM | POA: Diagnosis present

## 2021-08-02 DIAGNOSIS — D509 Iron deficiency anemia, unspecified: Secondary | ICD-10-CM | POA: Diagnosis not present

## 2021-08-02 DIAGNOSIS — Z87440 Personal history of urinary (tract) infections: Secondary | ICD-10-CM

## 2021-08-02 DIAGNOSIS — Z940 Kidney transplant status: Secondary | ICD-10-CM

## 2021-08-02 DIAGNOSIS — Z794 Long term (current) use of insulin: Secondary | ICD-10-CM

## 2021-08-02 DIAGNOSIS — J90 Pleural effusion, not elsewhere classified: Secondary | ICD-10-CM | POA: Diagnosis not present

## 2021-08-02 DIAGNOSIS — Z9981 Dependence on supplemental oxygen: Secondary | ICD-10-CM

## 2021-08-02 DIAGNOSIS — R600 Localized edema: Secondary | ICD-10-CM | POA: Diagnosis present

## 2021-08-02 DIAGNOSIS — R0902 Hypoxemia: Secondary | ICD-10-CM | POA: Diagnosis not present

## 2021-08-02 DIAGNOSIS — N2581 Secondary hyperparathyroidism of renal origin: Secondary | ICD-10-CM | POA: Diagnosis present

## 2021-08-02 DIAGNOSIS — E119 Type 2 diabetes mellitus without complications: Secondary | ICD-10-CM

## 2021-08-02 LAB — TROPONIN I (HIGH SENSITIVITY)
Troponin I (High Sensitivity): 27 ng/L — ABNORMAL HIGH (ref <18)
Troponin I (High Sensitivity): 28 ng/L — ABNORMAL HIGH (ref <18)

## 2021-08-02 LAB — URINALYSIS, ROUTINE W REFLEX MICROSCOPIC
Bilirubin Urine: NEGATIVE
Glucose, UA: NEGATIVE mg/dL
Ketones, ur: NEGATIVE mg/dL
Nitrite: NEGATIVE
Protein, ur: 100 mg/dL — AB
Specific Gravity, Urine: 1.002 — ABNORMAL LOW (ref 1.005–1.030)
WBC, UA: >50 WBC/hpf — ABNORMAL HIGH (ref 0–5)
pH: 5 (ref 5.0–8.0)

## 2021-08-02 LAB — CBC WITH DIFFERENTIAL/PLATELET
Abs Immature Granulocytes: 0.05 10*3/uL (ref 0.00–0.07)
Basophils Absolute: 0 10*3/uL (ref 0.0–0.1)
Basophils Relative: 0 %
Eosinophils Absolute: 0.1 10*3/uL (ref 0.0–0.5)
Eosinophils Relative: 1 %
HCT: 29.7 % — ABNORMAL LOW (ref 39.0–52.0)
Hemoglobin: 9 g/dL — ABNORMAL LOW (ref 13.0–17.0)
Immature Granulocytes: 1 %
Lymphocytes Relative: 8 %
Lymphs Abs: 0.8 10*3/uL (ref 0.7–4.0)
MCH: 27.8 pg (ref 26.0–34.0)
MCHC: 30.3 g/dL (ref 30.0–36.0)
MCV: 91.7 fL (ref 80.0–100.0)
Monocytes Absolute: 0.6 10*3/uL (ref 0.1–1.0)
Monocytes Relative: 6 %
Neutro Abs: 8.1 10*3/uL — ABNORMAL HIGH (ref 1.7–7.7)
Neutrophils Relative %: 84 %
Platelets: 199 10*3/uL (ref 150–400)
RBC: 3.24 MIL/uL — ABNORMAL LOW (ref 4.22–5.81)
RDW: 16.9 % — ABNORMAL HIGH (ref 11.5–15.5)
WBC: 9.6 10*3/uL (ref 4.0–10.5)
nRBC: 0 % (ref 0.0–0.2)

## 2021-08-02 LAB — COMPREHENSIVE METABOLIC PANEL
ALT: 24 U/L (ref 0–44)
AST: 30 U/L (ref 15–41)
Albumin: 3 g/dL — ABNORMAL LOW (ref 3.5–5.0)
Alkaline Phosphatase: 74 U/L (ref 38–126)
Anion gap: 9 (ref 5–15)
BUN: 35 mg/dL — ABNORMAL HIGH (ref 8–23)
CO2: 28 mmol/L (ref 22–32)
Calcium: 7.9 mg/dL — ABNORMAL LOW (ref 8.9–10.3)
Chloride: 96 mmol/L — ABNORMAL LOW (ref 98–111)
Creatinine, Ser: 3.04 mg/dL — ABNORMAL HIGH (ref 0.61–1.24)
GFR, Estimated: 20 mL/min — ABNORMAL LOW (ref >60)
Glucose, Bld: 228 mg/dL — ABNORMAL HIGH (ref 70–99)
Potassium: 4.4 mmol/L (ref 3.5–5.1)
Sodium: 133 mmol/L — ABNORMAL LOW (ref 135–145)
Total Bilirubin: 1.3 mg/dL — ABNORMAL HIGH (ref 0.3–1.2)
Total Protein: 6.1 g/dL — ABNORMAL LOW (ref 6.5–8.1)

## 2021-08-02 LAB — PROTIME-INR
INR: 2.2 — ABNORMAL HIGH (ref 0.8–1.2)
Prothrombin Time: 24 seconds — ABNORMAL HIGH (ref 11.4–15.2)

## 2021-08-02 NOTE — ED Triage Notes (Addendum)
BIB [facility] after unresponsive episode outside dialysis center. Per EMS, shallow respirations and weak pulse and responsive to painful stimuli. Initially 68% on RA, now on 10L simple face mask.   Pt has bruising and swelling to sternum from what appears to be attempted CPR.

## 2021-08-02 NOTE — ED Provider Notes (Signed)
[facility] EMERGENCY DEPARTMENT Provider Note   CSN: [id number] Arrival date & time: 08/02/21  1818     History Chief Complaint  Patient presents with   Loss of Consciousness    Kevin Frey is a 80 y.o. male. Level 5 caveat due to mental status change.  Loss of Consciousness Patient presents after unresponsive.  Reportedly found to be unresponsive outside a dialysis center.  Reportedly dialyzed today.  Reportedly had weak pulse shallow respirations.  Initial saturations of 68% on room air.  Appears to be on 3 L at baseline however.  Appears of had a syncopal episode 2 days ago and seen in [place] for it.  Appears to been sent home and started antibiotics to treat for sinusitis and possible pneumonia.  Patient can tell me feels bad but really cannot tell me what the events were.    Past Medical History:  Diagnosis Date   Acute ischemic vertebrobasilar artery thalamic stroke ([place]) 2015   Atrial fibrillation ([place])    On Coumadin   Basal cell carcinoma 01/08/2017   right forearm ( txpbx)   COPD (chronic obstructive pulmonary disease) ([place])    Deceased-donor kidney transplant 2015   Diabetic nephropathy (HCC)    End stage renal disease (HCC)    FSGS (focal segmental glomerulosclerosis)    HLD (hyperlipidemia)    HTN (hypertension)    Long-term use of immunosuppressant medication    SCC (squamous cell carcinoma) 05/10/2015   right side of face (cx48fu)   SCC (squamous cell carcinoma) 04/25/2016   right post scalp ( cx84fu)   SCC (squamous cell carcinoma) 08/27/2017   right post scalp (txpbx) insitu   SCC (squamous cell carcinoma) 05/05/2018   back of scalp (mohs) insitu   SCC (squamous cell carcinoma) 10/21/2018   top of right hand (txpbx) well diff   SCC (squamous cell carcinoma) 10/21/2018   back of crown (txpbx) insitu   SCC (squamous cell carcinoma) 06/18/2019   back of crown ( cx61fu) insitu   Squamous cell carcinoma of skin 05/10/2015   right crown insitu  (cx7fu)   Type 2 diabetes mellitus ([place])     Patient Active Problem List   Diagnosis Date Noted   Hypoalbuminemia 08/03/2021   Anemia in ESRD (end-stage renal disease) ([place]) 08/03/2021   Syncope and collapse 08/02/2021   ESRD (end stage renal disease) ([place])    Acute pulmonary edema ([place]) 07/17/2021   Acute on chronic respiratory failure with hypoxia ([place]) 07/17/2021   Atrial fibrillation, chronic ([place]) 07/17/2021   PUD (peptic ulcer disease) 07/17/2021   Diabetes mellitus, type II, insulin dependent ([place]) 07/17/2021   Renal transplant recipient 07/17/2021   Prolonged QT interval 07/17/2021   Pressure sore 07/17/2021   Lower urinary tract obstruction 07/10/2021   Urinary retention 07/10/2021   Heme positive stool 03/15/2020   Anemia 03/15/2020   Dysphagia 03/15/2020    Past Surgical History:  Procedure Laterality Date   COLONOSCOPY     Deceased donor kidney transplant  2015       Family History  Problem Relation Age of Onset   Colon cancer Neg Hx    Stomach cancer Neg Hx     Social History   Tobacco Use   Smoking status: Former    Types: Cigarettes   Smokeless tobacco: Never  Substance Use Topics   Alcohol use: Yes    Comment: occas glass of wine   Drug use: Never    Home Medications Prior to Admission medications   Medication  Sig Start Date End Date Taking? Authorizing Provider  acetaminophen (TYLENOL) 500 MG tablet Take 500 mg by mouth in the morning and at bedtime.   Yes [provider]  carvedilol (COREG) 12.5 MG tablet Take 1 tablet (12.5 mg total) by mouth 2 (two) times daily with a meal. 07/21/21  Yes Barton Dubois, MD  cefdinir (OMNICEF) 300 MG capsule Take 300 mg by mouth daily. 07/26/21  Yes [provider]  cephALEXin (KEFLEX) 500 MG capsule Take 500 mg by mouth daily. 08/01/21  Yes [provider]  Cholecalciferol 25 MCG (1000 UT) tablet Take by mouth daily.   Yes [provider]  cycloSPORINE modified (NEORAL)  100 MG capsule Take 1 capsule by mouth 2 (two) times daily. 07/07/21 07/07/22 Yes [provider]  diclofenac Sodium (VOLTAREN) 1 % GEL Apply topically as needed.   Yes [provider]  finasteride (PROSCAR) 5 MG tablet Take 1 tablet (5 mg total) by mouth daily. 07/21/21  Yes Barton Dubois, MD  insulin glargine (LANTUS) 100 UNIT/ML injection Inject 6 Units into the skin at bedtime. 02/03/16  Yes [provider]  ipratropium-albuterol (DUONEB) 0.5-2.5 (3) MG/3ML SOLN Inhale 3 mLs into the lungs every 6 (six) hours as needed (SOB/wheezing). 07/21/21  Yes Barton Dubois, MD  midodrine (PROAMATINE) 5 MG tablet Take 1 tablet (5 mg total) by mouth 2 (two) times daily with a meal. Only on HD days. Patient taking differently: Take 10 mg by mouth 2 (two) times daily with a meal. Only on HD days. 07/20/21  Yes Barton Dubois, MD  multivitamin (RENA-VIT) TABS tablet Take 1 tablet by mouth daily. 07/03/21  Yes [provider]  mycophenolate (MYFORTIC) 180 MG EC tablet Take 1 tablet (180 mg total) by mouth 2 (two) times daily. Patient taking differently: Take 360 mg by mouth 2 (two) times daily. 07/20/21 07/20/22 Yes Barton Dubois, MD  Omega 3 1000 MG CAPS Take 1 capsule by mouth daily.   Yes [provider]  pantoprazole (PROTONIX) 40 MG tablet Take 40 mg by mouth 2 (two) times daily. 06/21/21 08/20/21 Yes [provider]  tamsulosin (FLOMAX) 0.4 MG CAPS capsule Take 0.4 mg by mouth 2 (two) times daily. 08/08/20  Yes [provider]  warfarin (COUMADIN) 6 MG tablet Take 6 mg by mouth See admin instructions. Take 6 mg every other day and 3 mg on opposite days. 07/21/21  Yes [provider]  azithromycin (ZITHROMAX) 250 MG tablet Take 250 mg by mouth as directed. Patient not taking: Reported on 08/02/2021 08/01/21   [provider]  Continuous Blood Gluc Receiver (FREESTYLE LIBRE 14 DAY READER) [place] See admin instructions. 07/18/18   [provider]  Continuous Blood Gluc Sensor (FREESTYLE LIBRE 14 DAY SENSOR) MISC  12/05/19   [provider]  warfarin (COUMADIN) 10 MG tablet Take 6 mg by mouth daily. Take 3mg  every other day and 6mg  on opposite days Patient not taking: Reported on 08/02/2021 02/23/20   [provider]    Allergies    Lisinopril  Review of Systems   Review of Systems  Unable to perform ROS: Mental status change  Cardiovascular:  Positive for syncope.   Physical Exam Updated Vital Signs BP 131/73   Pulse 62   Temp 97.9 F (36.6 C) (Axillary)   Resp 20   Ht 5\' 10"  (1.778 m)   Wt 80 kg   SpO2 97%   BMI 25.31 kg/m   Physical Exam Vitals reviewed.  HENT:  Head: Atraumatic.  Eyes:     Pupils: Pupils are equal, round, and reactive to light.  Cardiovascular:     Rate and Rhythm: Normal rate. Rhythm irregular.  Pulmonary:     Comments: Harsh breath sounds with cough.  Dialysis catheter right chest wall. Abdominal:     Tenderness: There is no abdominal tenderness.  Musculoskeletal:        General: Tenderness present.     Cervical back: Neck supple. No tenderness.     Comments: Skin tear left forearm without underlying bony tenderness.  Skin:    General: Skin is warm.     Capillary Refill: Capillary refill takes less than 2 seconds.  Neurological:     Mental Status: He is oriented to person, place, and time.    ED Results / Procedures / Treatments   Labs (all labs ordered are listed, but only abnormal results are displayed) Labs Reviewed  COMPREHENSIVE METABOLIC PANEL - Abnormal; Notable for the following components:      Result Value   Sodium 133 (*)    Chloride 96 (*)    Glucose, Bld 228 (*)    BUN 35 (*)    Creatinine, Ser 3.04 (*)    Calcium 7.9 (*)    Total Protein 6.1 (*)    Albumin 3.0 (*)    Total Bilirubin 1.3 (*)    GFR, Estimated 20 (*)    All other components within normal limits  CBC WITH DIFFERENTIAL/PLATELET - Abnormal; Notable for the  following components:   RBC 3.24 (*)    Hemoglobin 9.0 (*)    HCT 29.7 (*)    RDW 16.9 (*)    Neutro Abs 8.1 (*)    All other components within normal limits  URINALYSIS, ROUTINE W REFLEX MICROSCOPIC - Abnormal; Notable for the following components:   APPearance CLOUDY (*)    Specific Gravity, Urine 1.002 (*)    Hgb urine dipstick LARGE (*)    Protein, ur 100 (*)    Leukocytes,Ua LARGE (*)    WBC, UA >50 (*)    Bacteria, UA FEW (*)    All other components within normal limits  PROTIME-INR - Abnormal; Notable for the following components:   Prothrombin Time 24.0 (*)    INR 2.2 (*)    All other components within normal limits  TROPONIN I (HIGH SENSITIVITY) - Abnormal; Notable for the following components:   Troponin I (High Sensitivity) 27 (*)    All other components within normal limits  TROPONIN I (HIGH SENSITIVITY) - Abnormal; Notable for the following components:   Troponin I (High Sensitivity) 28 (*)    All other components within normal limits  RESP PANEL BY RT-PCR (FLU A&B, COVID) [id number]    EKG EKG Interpretation  Date/Time:  Wednesday August 02 2021 18:30:28 EDT Ventricular Rate:  74 PR Interval:    QRS Duration: 113 QT Interval:  446 QTC Calculation: 495 R Axis:   30 Text Interpretation: Atrial fibrillation Borderline intraventricular conduction delay Abnormal T, consider ischemia, anterior leads Confirmed by Davonna Belling [phone]) on 08/02/2021 6:36:45 PM  Radiology CT HEAD WO CONTRAST (5MM)  Result Date: 08/02/2021 CLINICAL DATA:  Head trauma. EXAM: CT HEAD WITHOUT CONTRAST TECHNIQUE: Contiguous axial images were obtained from the base of the skull through the vertex without intravenous contrast. COMPARISON:  Head CT dated 07/31/2021. FINDINGS: Brain: There is moderate age-related atrophy and chronic microvascular ischemic changes. Bilateral basal ganglia calcifications noted. There is no acute intracranial hemorrhage. No mass effect or  midline shift. No  extra-axial fluid collection. Vascular: No hyperdense vessel or unexpected calcification. Skull: Normal. Negative for fracture or focal lesion. Sinuses/Orbits: Complete opacification of the visualized maxillary and sphenoid sinuses. No air-fluid level. The mastoid air cells are clear. Other: None IMPRESSION: 1. No acute intracranial pathology. 2. Moderate age-related atrophy and chronic microvascular ischemic changes. 3. Paranasal sinus disease. Electronically Signed   By: Anner Crete M.D.   On: 08/02/2021 19:43   DG Chest Portable 1 View  Result Date: 08/02/2021 CLINICAL DATA:  Shortness of breath. EXAM: PORTABLE CHEST 1 VIEW COMPARISON:  July 31, 2021 FINDINGS: There is stable right internal jugular venous catheter positioning. Low lung volumes are seen with chronic appearing increased interstitial lung markings. Mild to moderate severity areas of atelectasis and/or infiltrate are again seen within the bilateral lung bases. There is a small right pleural effusion. No pneumothorax is identified. The cardiac silhouette is enlarged and unchanged in size. The visualized skeletal structures are unremarkable. IMPRESSION: 1. Stable cardiomegaly and vascular congestion mild to moderate severity bibasilar atelectasis and/or infiltrate. 2. Small right pleural effusion. Electronically Signed   By: Virgina Norfolk M.D.   On: 08/02/2021 19:52    Procedures Procedures   Medications Ordered in ED Medications - No data to display  ED Course  I have reviewed the triage vital signs and the nursing notes.  Pertinent labs & imaging results that were available during my care of the patient were reviewed by me and considered in my medical decision making (see chart for details).    MDM Rules/Calculators/A&P                           Patient with syncopal episode and unresponsive episode after dialysis.  Reported collapse outside dialysis.  Has done the same previously.  Including 2 days ago where he got  CPR by family members.  Lab work reassuring.  Still in A. fib.  Head CT done reassuring.  Mental status improved with time.  Reportedly they were not able to get fluid off him because his pressure was low.  Reported dialyzed but may not change his weight.  With recurrent syncope will admit to hospitalist.  Discussed with Dr. Olevia Bowens Final Clinical Impression(s) / ED Diagnoses Final diagnoses:  Syncope, unspecified syncope type  End stage renal disease on dialysis [facility])    Rx / DC Orders ED Discharge Orders     None        Davonna Belling, MD 08/03/21 [phone]

## 2021-08-02 NOTE — ED Notes (Signed)
Spoke with nurse at [facility]. Per her account, patient received full session of HD with no fluid taking off d/t chronic hypotension. She states that after she sat the patient up in the recliner, patient became increasingly short of breath, lips began to turn blue, and minimally responded to a sternal rub. She states 911 was called but pt refused evaluation. Then the wife tried to take the pt home per his request and while getting pt into the vehicle, pt had a similar episode as before with agonal respirations. Oxygen was 68% on RA, NRB was applied, and 911 was called for transfer to this ED.   According to wife, pt has been having these shortness of breath episodes after dialysis very frequently where he is hard to arouse and his lips turn blue but states "then he wakes up and he's fine".

## 2021-08-03 ENCOUNTER — Other Ambulatory Visit (HOSPITAL_COMMUNITY): Payer: Medicare Other

## 2021-08-03 ENCOUNTER — Encounter (HOSPITAL_COMMUNITY): Payer: Self-pay | Admitting: Internal Medicine

## 2021-08-03 ENCOUNTER — Observation Stay (HOSPITAL_COMMUNITY): Payer: Medicare Other

## 2021-08-03 DIAGNOSIS — K279 Peptic ulcer, site unspecified, unspecified as acute or chronic, without hemorrhage or perforation: Secondary | ICD-10-CM

## 2021-08-03 DIAGNOSIS — I4821 Permanent atrial fibrillation: Secondary | ICD-10-CM | POA: Diagnosis not present

## 2021-08-03 DIAGNOSIS — R55 Syncope and collapse: Secondary | ICD-10-CM

## 2021-08-03 DIAGNOSIS — I350 Nonrheumatic aortic (valve) stenosis: Secondary | ICD-10-CM

## 2021-08-03 DIAGNOSIS — Z940 Kidney transplant status: Secondary | ICD-10-CM | POA: Diagnosis not present

## 2021-08-03 DIAGNOSIS — D631 Anemia in chronic kidney disease: Secondary | ICD-10-CM

## 2021-08-03 DIAGNOSIS — N2581 Secondary hyperparathyroidism of renal origin: Secondary | ICD-10-CM | POA: Diagnosis not present

## 2021-08-03 DIAGNOSIS — I482 Chronic atrial fibrillation, unspecified: Secondary | ICD-10-CM

## 2021-08-03 DIAGNOSIS — Z992 Dependence on renal dialysis: Secondary | ICD-10-CM

## 2021-08-03 DIAGNOSIS — N186 End stage renal disease: Secondary | ICD-10-CM

## 2021-08-03 DIAGNOSIS — I6523 Occlusion and stenosis of bilateral carotid arteries: Secondary | ICD-10-CM | POA: Diagnosis not present

## 2021-08-03 DIAGNOSIS — I120 Hypertensive chronic kidney disease with stage 5 chronic kidney disease or end stage renal disease: Secondary | ICD-10-CM | POA: Diagnosis not present

## 2021-08-03 DIAGNOSIS — Z794 Long term (current) use of insulin: Secondary | ICD-10-CM | POA: Diagnosis not present

## 2021-08-03 DIAGNOSIS — E8809 Other disorders of plasma-protein metabolism, not elsewhere classified: Secondary | ICD-10-CM | POA: Diagnosis present

## 2021-08-03 DIAGNOSIS — E119 Type 2 diabetes mellitus without complications: Secondary | ICD-10-CM | POA: Diagnosis not present

## 2021-08-03 DIAGNOSIS — D649 Anemia, unspecified: Secondary | ICD-10-CM | POA: Diagnosis not present

## 2021-08-03 DIAGNOSIS — R1310 Dysphagia, unspecified: Secondary | ICD-10-CM | POA: Diagnosis not present

## 2021-08-03 LAB — CBC WITH DIFFERENTIAL/PLATELET
Abs Immature Granulocytes: 0.03 10*3/uL (ref 0.00–0.07)
Basophils Absolute: 0 10*3/uL (ref 0.0–0.1)
Basophils Relative: 1 %
Eosinophils Absolute: 0.1 10*3/uL (ref 0.0–0.5)
Eosinophils Relative: 2 %
HCT: 29 % — ABNORMAL LOW (ref 39.0–52.0)
Hemoglobin: 8.9 g/dL — ABNORMAL LOW (ref 13.0–17.0)
Immature Granulocytes: 0 %
Lymphocytes Relative: 18 %
Lymphs Abs: 1.3 10*3/uL (ref 0.7–4.0)
MCH: 27.8 pg (ref 26.0–34.0)
MCHC: 30.7 g/dL (ref 30.0–36.0)
MCV: 90.6 fL (ref 80.0–100.0)
Monocytes Absolute: 0.8 10*3/uL (ref 0.1–1.0)
Monocytes Relative: 11 %
Neutro Abs: 4.9 10*3/uL (ref 1.7–7.7)
Neutrophils Relative %: 68 %
Platelets: 180 10*3/uL (ref 150–400)
RBC: 3.2 MIL/uL — ABNORMAL LOW (ref 4.22–5.81)
RDW: 17 % — ABNORMAL HIGH (ref 11.5–15.5)
WBC: 7.1 10*3/uL (ref 4.0–10.5)
nRBC: 0 % (ref 0.0–0.2)

## 2021-08-03 LAB — PROTIME-INR
INR: 2 — ABNORMAL HIGH (ref 0.8–1.2)
Prothrombin Time: 22.2 seconds — ABNORMAL HIGH (ref 11.4–15.2)

## 2021-08-03 LAB — RENAL FUNCTION PANEL
Albumin: 2.9 g/dL — ABNORMAL LOW (ref 3.5–5.0)
Anion gap: 7 (ref 5–15)
BUN: 42 mg/dL — ABNORMAL HIGH (ref 8–23)
CO2: 28 mmol/L (ref 22–32)
Calcium: 8.2 mg/dL — ABNORMAL LOW (ref 8.9–10.3)
Chloride: 98 mmol/L (ref 98–111)
Creatinine, Ser: 3.63 mg/dL — ABNORMAL HIGH (ref 0.61–1.24)
GFR, Estimated: 16 mL/min — ABNORMAL LOW (ref >60)
Glucose, Bld: 144 mg/dL — ABNORMAL HIGH (ref 70–99)
Phosphorus: 5.4 mg/dL — ABNORMAL HIGH (ref 2.5–4.6)
Potassium: 4.3 mmol/L (ref 3.5–5.1)
Sodium: 133 mmol/L — ABNORMAL LOW (ref 135–145)

## 2021-08-03 LAB — GLUCOSE, CAPILLARY
Glucose-Capillary: 126 mg/dL — ABNORMAL HIGH (ref 70–99)
Glucose-Capillary: 199 mg/dL — ABNORMAL HIGH (ref 70–99)
Glucose-Capillary: 208 mg/dL — ABNORMAL HIGH (ref 70–99)

## 2021-08-03 LAB — RESP PANEL BY RT-PCR (FLU A&B, COVID) ARPGX2
Influenza A by PCR: NEGATIVE
Influenza B by PCR: NEGATIVE
SARS Coronavirus 2 by RT PCR: NEGATIVE

## 2021-08-03 MED ORDER — WARFARIN SODIUM 6 MG PO TABS
6.0000 mg | ORAL_TABLET | ORAL | Status: AC
  Administered 2021-08-03: 6 mg via ORAL
  Filled 2021-08-03: qty 3
  Filled 2021-08-03: qty 1

## 2021-08-03 MED ORDER — MIDODRINE HCL 5 MG PO TABS
10.0000 mg | ORAL_TABLET | ORAL | Status: DC
  Administered 2021-08-04: 10 mg via ORAL
  Filled 2021-08-03: qty 2

## 2021-08-03 MED ORDER — WARFARIN - PHARMACIST DOSING INPATIENT: Freq: Every day | Status: DC

## 2021-08-03 MED ORDER — INSULIN GLARGINE-YFGN 100 UNIT/ML ~~LOC~~ SOLN
6.0000 [IU] | Freq: Every day | SUBCUTANEOUS | Status: DC
Start: 2021-08-03 — End: 2021-08-06
  Administered 2021-08-03 – 2021-08-05 (×3): 6 [IU] via SUBCUTANEOUS
  Filled 2021-08-03 (×5): qty 0.06

## 2021-08-03 MED ORDER — INSULIN ASPART 100 UNIT/ML IJ SOLN
0.0000 [IU] | Freq: Three times a day (TID) | INTRAMUSCULAR | Status: DC
  Administered 2021-08-03: 1 [IU] via SUBCUTANEOUS
  Administered 2021-08-04: 0 [IU] via SUBCUTANEOUS
  Administered 2021-08-04: 1 [IU] via SUBCUTANEOUS
  Administered 2021-08-05: 2 [IU] via SUBCUTANEOUS
  Administered 2021-08-06 (×2): 1 [IU] via SUBCUTANEOUS

## 2021-08-03 MED ORDER — MIDODRINE HCL 5 MG PO TABS
10.0000 mg | ORAL_TABLET | Freq: Two times a day (BID) | ORAL | Status: DC
  Administered 2021-08-03: 10 mg via ORAL
  Filled 2021-08-03: qty 2

## 2021-08-03 MED ORDER — ACETAMINOPHEN 650 MG RE SUPP: 650.0000 mg | Freq: Four times a day (QID) | RECTAL | Status: DC | PRN

## 2021-08-03 MED ORDER — CHLORHEXIDINE GLUCONATE CLOTH 2 % EX PADS
6.0000 | MEDICATED_PAD | Freq: Every day | CUTANEOUS | Status: DC
  Administered 2021-08-03 – 2021-08-05 (×3): 6 via TOPICAL

## 2021-08-03 MED ORDER — FINASTERIDE 5 MG PO TABS
5.0000 mg | ORAL_TABLET | Freq: Every day | ORAL | Status: DC
  Administered 2021-08-03 – 2021-08-06 (×4): 5 mg via ORAL
  Filled 2021-08-03 (×4): qty 1

## 2021-08-03 MED ORDER — WARFARIN SODIUM 2 MG PO TABS
6.0000 mg | ORAL_TABLET | ORAL | Status: DC
  Administered 2021-08-04: 6 mg via ORAL
  Filled 2021-08-03: qty 3

## 2021-08-03 MED ORDER — SODIUM CHLORIDE 0.9% FLUSH
3.0000 mL | Freq: Two times a day (BID) | INTRAVENOUS | Status: DC
  Administered 2021-08-03 – 2021-08-06 (×7): 3 mL via INTRAVENOUS

## 2021-08-03 MED ORDER — CHLORHEXIDINE GLUCONATE CLOTH 2 % EX PADS: 6.0000 | MEDICATED_PAD | Freq: Every day | CUTANEOUS | Status: DC

## 2021-08-03 MED ORDER — ACETAMINOPHEN 325 MG PO TABS
650.0000 mg | ORAL_TABLET | Freq: Four times a day (QID) | ORAL | Status: DC | PRN
  Administered 2021-08-04 – 2021-08-05 (×2): 650 mg via ORAL
  Filled 2021-08-03 (×2): qty 2

## 2021-08-03 MED ORDER — MYCOPHENOLATE SODIUM 180 MG PO TBEC
360.0000 mg | DELAYED_RELEASE_TABLET | Freq: Two times a day (BID) | ORAL | Status: DC
  Administered 2021-08-03 – 2021-08-06 (×7): 360 mg via ORAL
  Filled 2021-08-03 (×9): qty 2

## 2021-08-03 MED ORDER — WARFARIN SODIUM 2 MG PO TABS
3.0000 mg | ORAL_TABLET | ORAL | Status: DC
  Administered 2021-08-03: 3 mg via ORAL
  Filled 2021-08-03 (×2): qty 1

## 2021-08-03 MED ORDER — CARVEDILOL 3.125 MG PO TABS
3.1250 mg | ORAL_TABLET | Freq: Two times a day (BID) | ORAL | Status: DC
  Administered 2021-08-03 – 2021-08-04 (×2): 3.125 mg via ORAL
  Filled 2021-08-03 (×2): qty 1

## 2021-08-03 MED ORDER — IPRATROPIUM-ALBUTEROL 0.5-2.5 (3) MG/3ML IN SOLN
3.0000 mL | Freq: Four times a day (QID) | RESPIRATORY_TRACT | Status: DC | PRN
  Administered 2021-08-05: 3 mL via RESPIRATORY_TRACT
  Filled 2021-08-03: qty 3

## 2021-08-03 MED ORDER — CYCLOSPORINE MODIFIED (NEORAL) 100 MG PO CAPS
100.0000 mg | ORAL_CAPSULE | Freq: Two times a day (BID) | ORAL | Status: DC
  Administered 2021-08-03 (×2): 100 mg via ORAL
  Filled 2021-08-03 (×9): qty 1

## 2021-08-03 MED ORDER — CEPHALEXIN 500 MG PO CAPS
500.0000 mg | ORAL_CAPSULE | Freq: Every day | ORAL | Status: DC
  Administered 2021-08-03 – 2021-08-06 (×4): 500 mg via ORAL
  Filled 2021-08-03 (×4): qty 1

## 2021-08-03 MED ORDER — PANTOPRAZOLE SODIUM 40 MG PO TBEC
40.0000 mg | DELAYED_RELEASE_TABLET | Freq: Two times a day (BID) | ORAL | Status: DC
  Administered 2021-08-03 – 2021-08-06 (×7): 40 mg via ORAL
  Filled 2021-08-03 (×7): qty 1

## 2021-08-03 NOTE — Progress Notes (Signed)
PROGRESS NOTE    Kevin Frey  ZWC:[id number] DOB: [birth year] DOA: 08/02/2021 PCP: Caryl Bis, MD    Chief Complaint  Patient presents with   Loss of Consciousness    Brief Narrative:  Patient is 80 year old gentleman history of hypertension, A. fib on Coumadin, AS, ESRD on HD, hypertension, peptic ulcer disease, chronic anemia presented to the ED with a syncopal episode after hemodialysis.  Cardiology and nephrology consulted.   Assessment & Plan:   Principal Problem:   Syncope and collapse Active Problems:   Atrial fibrillation, chronic (HCC)   Diabetes mellitus, type II, insulin dependent (HCC)   ESRD (end stage renal disease) (HCC)   Hypoalbuminemia   Anemia in ESRD (end-stage renal disease) ([place])  #1 syncopal episode -Noted to have occurred post hemodialysis. -Per patient and wife during hemodialysis patient noted to have systolic blood pressures in the 80s despite taking midodrine 20 mg pretreatment. -It is noted that blood pressure remained low at the end of treatment patient had a syncopal episode then and recovered refused EMS.  Patient had another syncopal episode when he returned to his car and transported to the ED. -Blood pressure on presentation overnight with systolics in the 235T. -Patient also with history of moderate to severe AS. -Head CT done negative for any acute abnormalities. -Patient with recent 2D echo 06/2021 with moderate to severe AS. -Carotid Dopplers done this hospitalization with no significant hemodynamic stenosis noted. -Patient seen in consultation by cardiology who reviewed telemetry and patient noted to have some episodes of bradycardia with heart rate in the 50s to 60s and pauses up to 2.8 seconds in A. fib. -Continue to hold Coreg, cardiology recommending resumption of Coreg at a lower dose of 3.125 twice daily with consideration for outpatient monitor. -May need to consider increasing dose of midodrine. -Nephrology recommending TED  hose. -Supportive care.  Follow.  2.  End-stage renal disease on HD (MWF [facility] ) -Patient end-stage renal disease status post DD KT in 2015 with recent graft failure and reinstitution of dialysis June 09, 2021. -Patient presented with syncopal episode post HD and noted during HD to have systolic blood pressures in the 80s despite midodrine 20 mg daily. -Nephrology consulted and planning for hemodialysis tomorrow with pretreatment with midodrine, may need to increase midodrine dose however will defer to nephrology. -Continue current dose of Myfortic. -Appreciate nephrology input and recommendations.  3.  Permanent atrial fibrillation -Noted to be on Coreg 12.5 mg twice daily for rate control prior to admission and Coumadin for anticoagulation. -Coreg on hold after presentation to problem #1. -Patient seen by cardiology who are noting some episodes of bradycardia on telemetry with heart rates in the 50s to 60s and pauses up to 2.8 seconds while in A. fib. -Cardiology recommending lower dose of Coreg 3.125 mg twice daily. -Coumadin for anticoagulation. -Per cardiology.  4.  Abnormal echo -Per cardiology patient with recent TEE 06/2021 showing a large mobile echodensity in the RV and large mobile echodensity on aortic which could represent atheroma or thrombus.  It is noted that cardiac MRI during that admission did not show any abnormalities. -Continue Coumadin. -Per cardiology.  5.  Aortic stenosis -Patient noted to have severe aortic stenosis per 2D echo 06/2021 with aortic stenosis read as moderate by TEE during that admission. -Per cardiology on review of notes from Dr. Candis Musa conservative management was preferred at this time. -Outpatient follow-up with cardiology.  6.  Anemia of chronic disease -H&H stable at 8.9. -Per nephrology.  7.  Diabetes mellitus type 2, insulin-dependent -Hemoglobin A1c 4.9 (07/18/2021) -CBG 126 this morning. -Continue current dose of Semglee, SSI.  8.   Recent diagnosis of sinusitis -Continue Keflex.  9.  BPH with chronic indwelling Foley catheter -Continue home regimen Proscar. -Outpatient follow-up with urology for cystoscopy, voiding trial and cystoscopy urethrogram.  10.  History of peptic ulcer disease -Place on PPI twice daily.  11.  Dysphagia -Place on dysphagia 3 diet with honey thick liquids. -SLP evaluation.    DVT prophylaxis: Coumadin Code Status: Full Family Communication: Updated patient and wife at bedside.  Disposition:   Status is: Observation  The patient remains OBS appropriate and will d/c before 2 midnights.  Dispo: The patient is from: Home              Anticipated d/c is to: Home              Patient currently is not medically stable to d/c.   Difficult to place patient No       Consultants:  Cardiology: Dr. Domenic Polite 08/03/2021 Nephrology: Dr. Johnney Ou 08/03/2021  Procedures:  CT head 08/02/2021 Chest x-ray 08/02/2021 Carotid Dopplers 08/03/2021     Antimicrobials:  Keflex 08/03/2021>>>>   Subjective: Laying in bed.  Wife at bedside.  Denies any chest pain.  No shortness of breath.  Per wife during syncopal episode patient turned white, blue in the lips, and looks like he was not breathing.  Per wife it was also noted at hemodialysis yesterday patient systolic blood pressures were in the 80s.  Objective: Vitals:   08/03/21 0130 08/03/21 0430 08/03/21 1104 08/03/21 1107  BP: 132/64 109/69 129/67 128/80  Pulse: 71 65 (!) 58 (!) 55  Resp: 14 14    Temp:    97.8 F (36.6 C)  TempSrc:    Oral  SpO2: 94% 92% 91% 97%  Weight:      Height:        Intake/Output Summary (Last 24 hours) at 08/03/2021 1218 Last data filed at 08/03/2021 0400 Gross per 24 hour  Intake --  Output 40 ml  Net -40 ml   Filed Weights   08/02/21 1821  Weight: 80 kg    Examination:  General exam: Appears calm and comfortable  Respiratory system: Clear to auscultation anterior lung fields.  No wheezes, no crackles,  no rhonchi. Respiratory effort normal. Cardiovascular system: Irregularly irregular.  3/6 systolic ejection murmur.  No JVD.  Trace bilateral lower extremity edema. Gastrointestinal system: Abdomen is nondistended, soft and nontender. No organomegaly or masses felt. Normal bowel sounds heard. Central nervous system: Alert and oriented. No focal neurological deficits. Extremities: Trace bilateral lower extremity edema.  Symmetric 5 x 5 power. Skin: No rashes, lesions or ulcers Psychiatry: Judgement and insight appear normal. Mood & affect appropriate.     Data Reviewed: I have personally reviewed following labs and imaging studies  CBC: Recent Labs  Lab 08/02/21 1901 08/03/21 0924  WBC 9.6 7.1  NEUTROABS 8.1* 4.9  HGB 9.0* 8.9*  HCT 29.7* 29.0*  MCV 91.7 90.6  PLT 199 267    Basic Metabolic Panel: Recent Labs  Lab 08/02/21 1901 08/03/21 0924  NA 133* 133*  K 4.4 4.3  CL 96* 98  CO2 28 28  GLUCOSE 228* 144*  BUN 35* 42*  CREATININE 3.04* 3.63*  CALCIUM 7.9* 8.2*  PHOS  --  5.4*    GFR: Estimated Creatinine Clearance: 16.5 mL/min (A) (by C-G formula based on SCr of 3.63 mg/dL (H)).  Liver Function Tests: Recent Labs  Lab 08/02/21 1901 08/03/21 0924  AST 30  --   ALT 24  --   ALKPHOS 74  --   BILITOT 1.3*  --   PROT 6.1*  --   ALBUMIN 3.0* 2.9*    CBG: Recent Labs  Lab 08/03/21 1206  GLUCAP 126*     Recent Results (from the past 240 hour(s))  Resp Panel by RT-PCR (Flu A&B, Covid) Nasopharyngeal Swab     Status: None   Collection Time: 08/02/21 11:06 PM   Specimen: Nasopharyngeal Swab; Nasopharyngeal(NP) swabs in vial transport medium  Result Value Ref Range Status   SARS Coronavirus 2 by RT PCR NEGATIVE NEGATIVE Final    Comment: (NOTE) SARS-CoV-2 target nucleic acids are NOT DETECTED.  The SARS-CoV-2 RNA is generally detectable in upper respiratory specimens during the acute phase of infection. The lowest concentration of SARS-CoV-2 viral  copies this assay can detect is 138 copies/mL. A negative result does not preclude SARS-Cov-2 infection and should not be used as the sole basis for treatment or other patient management decisions. A negative result may occur with  improper specimen collection/handling, submission of specimen other than nasopharyngeal swab, presence of viral mutation(s) within the areas targeted by this assay, and inadequate number of viral copies(<138 copies/mL). A negative result must be combined with clinical observations, patient history, and epidemiological information. The expected result is Negative.  Fact Sheet for Patients:  EntrepreneurPulse.com.au  Fact Sheet for Healthcare Providers:  IncredibleEmployment.be  This test is no t yet approved or cleared by the [place] FDA and  has been authorized for detection and/or diagnosis of SARS-CoV-2 by FDA under an Emergency Use Authorization (EUA). This EUA will remain  in effect (meaning this test can be used) for the duration of the COVID-19 declaration under Section 564(b)(1) of the Act, 21 U.S.C.section 360bbb-3(b)(1), unless the authorization is terminated  or revoked sooner.       Influenza A by PCR NEGATIVE NEGATIVE Final   Influenza B by PCR NEGATIVE NEGATIVE Final    Comment: (NOTE) The Xpert Xpress SARS-CoV-2/FLU/RSV plus assay is intended as an aid in the diagnosis of influenza from Nasopharyngeal swab specimens and should not be used as a sole basis for treatment. Nasal washings and aspirates are unacceptable for Xpert Xpress SARS-CoV-2/FLU/RSV testing.  Fact Sheet for Patients: EntrepreneurPulse.com.au  Fact Sheet for Healthcare Providers: IncredibleEmployment.be  This test is not yet approved or cleared by the [place] FDA and has been authorized for detection and/or diagnosis of SARS-CoV-2 by FDA under an Emergency Use Authorization (EUA). This  EUA will remain in effect (meaning this test can be used) for the duration of the COVID-19 declaration under Section 564(b)(1) of the Act, 21 U.S.C. section 360bbb-3(b)(1), unless the authorization is terminated or revoked.  Performed at [facility], [address] [place], [place] [address]          Radiology Studies: CT HEAD WO CONTRAST (5MM)  Result Date: 08/02/2021 CLINICAL DATA:  Head trauma. EXAM: CT HEAD WITHOUT CONTRAST TECHNIQUE: Contiguous axial images were obtained from the base of the skull through the vertex without intravenous contrast. COMPARISON:  Head CT dated 07/31/2021. FINDINGS: Brain: There is moderate age-related atrophy and chronic microvascular ischemic changes. Bilateral basal ganglia calcifications noted. There is no acute intracranial hemorrhage. No mass effect or midline shift. No extra-axial fluid collection. Vascular: No hyperdense vessel or unexpected calcification. Skull: Normal. Negative for fracture or focal lesion. Sinuses/Orbits: Complete opacification of the visualized  maxillary and sphenoid sinuses. No air-fluid level. The mastoid air cells are clear. Other: None IMPRESSION: 1. No acute intracranial pathology. 2. Moderate age-related atrophy and chronic microvascular ischemic changes. 3. Paranasal sinus disease. Electronically Signed   By: Anner Crete M.D.   On: 08/02/2021 19:43   DG Chest Portable 1 View  Result Date: 08/02/2021 CLINICAL DATA:  Shortness of breath. EXAM: PORTABLE CHEST 1 VIEW COMPARISON:  July 31, 2021 FINDINGS: There is stable right internal jugular venous catheter positioning. Low lung volumes are seen with chronic appearing increased interstitial lung markings. Mild to moderate severity areas of atelectasis and/or infiltrate are again seen within the bilateral lung bases. There is a small right pleural effusion. No pneumothorax is identified. The cardiac silhouette is enlarged and unchanged in size. The visualized skeletal  structures are unremarkable. IMPRESSION: 1. Stable cardiomegaly and vascular congestion mild to moderate severity bibasilar atelectasis and/or infiltrate. 2. Small right pleural effusion. Electronically Signed   By: Virgina Norfolk M.D.   On: 08/02/2021 19:52        Scheduled Meds:  carvedilol  3.125 mg Oral BID WC   cephALEXin  500 mg Oral Daily   Chlorhexidine Gluconate Cloth  6 each Topical Q0600   cycloSPORINE modified  100 mg Oral BID   finasteride  5 mg Oral Daily   insulin aspart  0-6 Units Subcutaneous TID WC   insulin glargine-yfgn  6 Units Subcutaneous QHS   [START ON 08/04/2021] midodrine  10 mg Oral 2 times per day on Mon Wed Fri   mycophenolate  360 mg Oral BID   pantoprazole  40 mg Oral BID   sodium chloride flush  3 mL Intravenous Q12H   warfarin  3 mg Oral Q48H   [START ON 08/04/2021] warfarin  6 mg Oral Q48H   Warfarin - Pharmacist Dosing Inpatient   Does not apply q1600   Continuous Infusions:   LOS: 0 days    Time spent: 35 minutes    Irine Seal, MD [facility]   To contact the attending provider between 7A-7P or the covering provider during after hours 7P-7A, please log into the web site [email or web address] and access using universal [place] password for that web site. If you do not have the password, please call the hospital operator.  08/03/2021, 12:18 PM

## 2021-08-03 NOTE — Consult Note (Addendum)
Cardiology Consultation:   Patient ID: Kevin Frey MRN: [id number]; DOB: 1941/07/12  Admit date: 08/02/2021 Date of Consult: 08/03/2021  PCP:  Caryl Bis, MD   [place] Prov[facility] Cardiologist: Dr. Marene Lenz   Patient Profile:   Kevin Frey is a 80 y.o. male with a hx of permanent atrial fibrillation (on Coumadin), ESRD (on HD - MWF schedule), chronic hypoxic respiratory failure (on 3L [place] at baseline), aortic stenosis, HTN, IDDM, PUD and chronic anemia who is being seen 08/03/2021 for the evaluation of syncope at the request of Dr. Olevia Bowens.  History of Present Illness:   Mr. Lamping was recently admitted to [facility] from 8/15 - 07/21/2021 for sepsis in the setting of a UTI. He responded well to antibiotic therapy and Nephrology did follow the patient during admission to assist with dialysis. Was discharged on Coreg 12.5mg  BID, Midodrine 5mg  BID on HD days and Coumadin for anticoagulation.   He did follow-up with Dr. Candis Musa at [facility] on 07/27/2021 and by review of notes, no changes were made to his medication regimen. He did have known moderate to severe aortic stenosis by prior echocardiogram and by review of his notes, the patient had preferred conservative management of his cardiac issues.  Appears he presented to [facility] ED on 07/31/2021 for a syncopal episode which occurred while at dialysis and CT Head was obtained which showed no acute intracranial abnormality. He did have paranasal sinus disease and was started on antibiotic therapy.   He presented back to the ED on 08/02/2021 for evaluation of an unresponsive episode while at HD. EMS reported that he had a weak pulse but was responsive to painful stimuli upon arrival and oxygen saturations were initially at 68% but improved with placement of a facemask. Initial labs showed WBC 9.6, Hb 9.0, platelets 199, Na+ 133, K+ 4.4 and creatinine 3.04. INR 2.2. Initial and Hs Troponin flat at 27 and 28. COVID  negative. CT Head with no acute intracranial abnormalities. CXR showing stable cardiomegaly and vascular congestion. EKG shows rate-controlled atrial fibrillation, HR 74 with TWI along the anterior leads.   In talking with the patient today, he reports his 2 syncopal episodes this week have occurred after dialysis sessions. The first episode occurred after walking from the center to his car and most recent episode yesterday occurred when walking from his car into his home. He denies any warning symptoms and is unaware of any associated chest pain, palpitations, dyspnea or dizziness. Says he just "blacks out". The episodes have been witnessed by his wife and he is unsure for how long he loses consciousness. No reported bowel or bladder incontinence.    Past Medical History:  Diagnosis Date   Acute ischemic vertebrobasilar artery thalamic stroke ([place]) 2015   Atrial fibrillation (HCC)    On Coumadin   Basal cell carcinoma 01/08/2017   right forearm ( txpbx)   COPD (chronic obstructive pulmonary disease) (HCC)    Deceased-donor kidney transplant 2015   Diabetic nephropathy (HCC)    End stage renal disease (HCC)    FSGS (focal segmental glomerulosclerosis)    HLD (hyperlipidemia)    HTN (hypertension)    Long-term use of immunosuppressant medication    SCC (squamous cell carcinoma) 05/10/2015   right side of face (cx51fu)   SCC (squamous cell carcinoma) 04/25/2016   right post scalp ( cx97fu)   SCC (squamous cell carcinoma) 08/27/2017   right post scalp (txpbx) insitu   SCC (squamous cell carcinoma) 05/05/2018  back of scalp (mohs) insitu   SCC (squamous cell carcinoma) 10/21/2018   top of right hand (txpbx) well diff   SCC (squamous cell carcinoma) 10/21/2018   back of crown (txpbx) insitu   SCC (squamous cell carcinoma) 06/18/2019   back of crown ( cx68fu) insitu   Squamous cell carcinoma of skin 05/10/2015   right crown insitu (cx35fu)   Type 2 diabetes mellitus ([place])     Past  Surgical History:  Procedure Laterality Date   COLONOSCOPY     Deceased donor kidney transplant  2015     Home Medications:  Prior to Admission medications   Medication Sig Start Date End Date Taking? Authorizing Provider  acetaminophen (TYLENOL) 500 MG tablet Take 500 mg by mouth in the morning and at bedtime.   Yes [provider]  carvedilol (COREG) 12.5 MG tablet Take 1 tablet (12.5 mg total) by mouth 2 (two) times daily with a meal. 07/21/21  Yes Barton Dubois, MD  cefdinir (OMNICEF) 300 MG capsule Take 300 mg by mouth daily. 07/26/21  Yes [provider]  cephALEXin (KEFLEX) 500 MG capsule Take 500 mg by mouth daily. 08/01/21  Yes [provider]  Cholecalciferol 25 MCG (1000 UT) tablet Take by mouth daily.   Yes [provider]  cycloSPORINE modified (NEORAL) 100 MG capsule Take 1 capsule by mouth 2 (two) times daily. 07/07/21 07/07/22 Yes [provider]  diclofenac Sodium (VOLTAREN) 1 % GEL Apply topically as needed.   Yes [provider]  finasteride (PROSCAR) 5 MG tablet Take 1 tablet (5 mg total) by mouth daily. 07/21/21  Yes Barton Dubois, MD  insulin glargine (LANTUS) 100 UNIT/ML injection Inject 6 Units into the skin at bedtime. 02/03/16  Yes [provider]  ipratropium-albuterol (DUONEB) 0.5-2.5 (3) MG/3ML SOLN Inhale 3 mLs into the lungs every 6 (six) hours as needed (SOB/wheezing). 07/21/21  Yes Barton Dubois, MD  midodrine (PROAMATINE) 5 MG tablet Take 1 tablet (5 mg total) by mouth 2 (two) times daily with a meal. Only on HD days. Patient taking differently: Take 10 mg by mouth 2 (two) times daily with a meal. Only on HD days. 07/20/21  Yes Barton Dubois, MD  multivitamin (RENA-VIT) TABS tablet Take 1 tablet by mouth daily. 07/03/21  Yes [provider]  mycophenolate (MYFORTIC) 180 MG EC tablet Take 1 tablet (180 mg total) by mouth 2 (two) times daily. Patient taking differently: Take 360 mg by mouth 2 (two)  times daily. 07/20/21 07/20/22 Yes Barton Dubois, MD  Omega 3 1000 MG CAPS Take 1 capsule by mouth daily.   Yes [provider]  pantoprazole (PROTONIX) 40 MG tablet Take 40 mg by mouth 2 (two) times daily. 06/21/21 08/20/21 Yes [provider]  tamsulosin (FLOMAX) 0.4 MG CAPS capsule Take 0.4 mg by mouth 2 (two) times daily. 08/08/20  Yes [provider]  warfarin (COUMADIN) 6 MG tablet Take 6 mg by mouth See admin instructions. Take 6 mg every other day and 3 mg on opposite days. 07/21/21  Yes [provider]  azithromycin (ZITHROMAX) 250 MG tablet Take 250 mg by mouth as directed. Patient not taking: Reported on 08/02/2021 08/01/21   [provider]  Continuous Blood Gluc Receiver (FREESTYLE LIBRE 14 DAY READER) [place] See admin instructions. 07/18/18   [provider]  Continuous Blood Gluc Sensor (FREESTYLE LIBRE 14 DAY SENSOR) MISC  12/05/19   [provider]    Inpatient Medications: Scheduled Meds:  carvedilol  3.125  mg Oral BID WC   cephALEXin  500 mg Oral Daily   cycloSPORINE modified  100 mg Oral BID   finasteride  5 mg Oral Daily   insulin aspart  0-6 Units Subcutaneous TID WC   insulin glargine-yfgn  6 Units Subcutaneous QHS   midodrine  10 mg Oral BID WC   mycophenolate  360 mg Oral BID   pantoprazole  40 mg Oral BID   sodium chloride flush  3 mL Intravenous Q12H   warfarin  3 mg Oral Q48H   [START ON 08/04/2021] warfarin  6 mg Oral Q48H   Warfarin - Pharmacist Dosing Inpatient   Does not apply q1600   Continuous Infusions:  PRN Meds: acetaminophen **OR** acetaminophen, ipratropium-albuterol  Allergies:    Allergies  Allergen Reactions   Lisinopril Other (See Comments)    Other reaction(s): Other (See Comments) Mouth,facial swelling  Mouth,facial swelling  Mouth,facial swelling      Social History:   Social History   Socioeconomic History   Marital status: Unknown    Spouse name: Not on file   Number of  children: Not on file   Years of education: Not on file   Highest education level: Not on file  Occupational History   Not on file  Tobacco Use   Smoking status: Former    Types: Cigarettes   Smokeless tobacco: Never  Substance and Sexual Activity   Alcohol use: Yes    Comment: occas glass of wine   Drug use: Never   Sexual activity: Not on file  Other Topics Concern   Not on file  Social History Narrative   Not on file   Social Determinants of Health   Financial Resource Strain: Not on file  Food Insecurity: Not on file  Transportation Needs: Not on file  Physical Activity: Not on file  Stress: Not on file  Social Connections: Not on file  Intimate Partner Violence: Not on file    Family History:    Family History  Problem Relation Age of Onset   Colon cancer Neg Hx    Stomach cancer Neg Hx      ROS:  Please see the history of present illness.   All other ROS reviewed and negative.     Physical Exam/Data:   Vitals:   08/02/21 2024 08/02/21 2209 08/03/21 0130 08/03/21 0430  BP:  131/73 132/64 109/69  Pulse:  62 71 65  Resp:  20 14 14   Temp:      TempSrc:      SpO2: 99% 97% 94% 92%  Weight:      Height:        Intake/Output Summary (Last 24 hours) at 08/03/2021 0930 Last data filed at 08/03/2021 0400 Gross per 24 hour  Intake --  Output 40 ml  Net -40 ml   Last 3 Weights 08/02/2021 07/21/2021 07/20/2021  Weight (lbs) 176 lb 5.9 oz 176 lb 5.9 oz 178 lb 2.1 oz  Weight (kg) 80 kg 80 kg 80.8 kg     Body mass index is 25.31 kg/m.  General: Pleasant elderly male appeaering in no acute distres HEENT: normal Lymph: no adenopathy Neck: no JVD Endocrine:  No thryomegaly Vascular: No carotid bruits; FA pulses 2+ bilaterally without bruits  Cardiac:  normal S1, S2; Irregularly irregular. 3/6 SEM along RUSB.  Lungs: Decreased breath sounds along bases bilaterally. No wheezing.  Abd: soft, nontender, no hepatomegaly  Ext: Trace lower extremity edema.  Compression stockings in place.  Musculoskeletal:  No deformities, BUE and BLE strength normal and equal Skin: warm and dry  Neuro:  CNs 2-12 intact, no focal abnormalities noted Psych:  Normal affect   EKG:  The EKG was personally reviewed and demonstrates: rate-controlled atrial fibrillation, HR 74 with TWI along the anterior leads.   Telemetry:  Telemetry was personally reviewed and demonstrates:  Initially tachycardiac with HR in 140's to 150's. Has been in rate-controlled atrial fibrillation overnight and this morning with HR in the 50's to 60's. Occasional pauses with the longest being 2.8 seconds.   Relevant CV Studies:  Echocardiogram: 06/2021 SUMMARY  Left ventricular systolic function is normal.  LV ejection fraction = 60-65%.  The right ventricular systolic function is normal.  The left atrium is moderately dilated.  The right atrium is mildly dilated.  There is moderate mitral annular calcification.  There is mild mitral regurgitation.  There is mild tricuspid regurgitation.  The pulmonic valve is not well visualized.  IVC size was severely dilated.  There is no pericardial effusion.  There is a pleural effusion present.  There is mild concentric left ventricular hypertrophy.  The left ventricular wall motion is normal.  The right ventricle is mild to moderately dilated.  Noted highly mobile echodensity in the RV, measure 1.6cm, best seen on  clip 4. Clinical correlation advised. Consider TEE if needed for  further evaluation.  Aortic valve is not well visualized but appears restricted leaflet  opening. Transaortic gradient is not well interrogated in this study.  However, prior study in 04/2021 showed moderate to severe AS.    TEE: 06/2021 SUMMARY  Cannot fully assess LV function with images obtained.  The right ventricle is normal size.  The right ventricular systolic function is normal.  The left atrial size is normal.  No thrombus is detected in the left  atrial appendage.  Diffuse thickening of the aortic valve with restricted cusp opening.  Planimetered valve area in 2D echo is 1.1 cm2  There is mild aortic regurgitation.  There is moderate aortic stenosis.  There is mild mitral regurgitation.  Structurally normal tricuspid valve.  No tricuspid regurgitation.  There is no pericardial effusion.  Mobile echodensity in the RV attached to the TV chordae, measuring  1.6cm x 0.89cm, best seen in clip 19. Clinical correlation advised.  Consider CMR for further characterization if clinically indicated.  There is a large mobile echodensity on the aortic arch, measured  2.3cmx1.1cm, seen in clip 49. Differential include atheroma and  thrombus.    cMRI: 06/2021 Conclusions:                                                                                                                                                        RV small mass is not seen on the current study, there is only mildly  prominent trabceculation  that corresponds with location of unspecified                    mass on TTE. No hypervascularity or late gadolinium enhancement of this                    area was seen.    Laboratory Data:  High Sensitivity Troponin:   Recent Labs  Lab 08/02/21 1901 08/02/21 2157  TROPONINIHS 27* 28*     Chemistry Recent Labs  Lab 08/02/21 1901  NA 133*  K 4.4  CL 96*  CO2 28  GLUCOSE 228*  BUN 35*  CREATININE 3.04*  CALCIUM 7.9*  GFRNONAA 20*  ANIONGAP 9    Recent Labs  Lab 08/02/21 1901  PROT 6.1*  ALBUMIN 3.0*  AST 30  ALT 24  ALKPHOS 74  BILITOT 1.3*   Hematology Recent Labs  Lab 08/02/21 1901  WBC 9.6  RBC 3.24*  HGB 9.0*  HCT 29.7*  MCV 91.7  MCH 27.8  MCHC 30.3  RDW 16.9*  PLT 199   BNPNo results for input(s): BNP, PROBNP in the last 168 hours.  DDimer No results for input(s): DDIMER in the last 168 hours.   Radiology/Studies:  CT HEAD WO CONTRAST (5MM)  Result Date:  08/02/2021 CLINICAL DATA:  Head trauma. EXAM: CT HEAD WITHOUT CONTRAST TECHNIQUE: Contiguous axial images were obtained from the base of the skull through the vertex without intravenous contrast. COMPARISON:  Head CT dated 07/31/2021. FINDINGS: Brain: There is moderate age-related atrophy and chronic microvascular ischemic changes. Bilateral basal ganglia calcifications noted. There is no acute intracranial hemorrhage. No mass effect or midline shift. No extra-axial fluid collection. Vascular: No hyperdense vessel or unexpected calcification. Skull: Normal. Negative for fracture or focal lesion. Sinuses/Orbits: Complete opacification of the visualized maxillary and sphenoid sinuses. No air-fluid level. The mastoid air cells are clear. Other: None IMPRESSION: 1. No acute intracranial pathology. 2. Moderate age-related atrophy and chronic microvascular ischemic changes. 3. Paranasal sinus disease. Electronically Signed   By: Anner Crete M.D.   On: 08/02/2021 19:43   DG Chest Portable 1 View  Result Date: 08/02/2021 CLINICAL DATA:  Shortness of breath. EXAM: PORTABLE CHEST 1 VIEW COMPARISON:  July 31, 2021 FINDINGS: There is stable right internal jugular venous catheter positioning. Low lung volumes are seen with chronic appearing increased interstitial lung markings. Mild to moderate severity areas of atelectasis and/or infiltrate are again seen within the bilateral lung bases. There is a small right pleural effusion. No pneumothorax is identified. The cardiac silhouette is enlarged and unchanged in size. The visualized skeletal structures are unremarkable. IMPRESSION: 1. Stable cardiomegaly and vascular congestion mild to moderate severity bibasilar atelectasis and/or infiltrate. 2. Small right pleural effusion. Electronically Signed   By: Virgina Norfolk M.D.   On: 08/02/2021 19:52     Assessment and Plan:   1. Syncopal Episodes - His episodes this week have occurred after dialysis sessions and  are concerning for dehydration and orthostatic hypotension as they occur after changing positions and up with walking. He denies any associated chest pain, palpitations or dizziness. - Electrolytes have been within a normal range and troponin values have been flat at 27 and 28.  Given his echocardiogram in 06/2021, would not anticipate repeat imaging at this time but will review with Dr. Domenic Polite.  Carotid Dopplers are pending. Will check orthostatic vitals once patient is able to do so. - He does have episodes of bradycardia  on telemetry with heart rate in the 50's to 60's and pauses up to 2.8 seconds when in atrial fibrillation. He was on Coreg 12.5 mg twice daily prior to admission and this was held overnight. Will restart at a lower dose of 3.125 mg twice daily. Continue to follow on telemetry. He may benefit from an outpatient monitor pending reassessment of telemetry this admission.   2. Permanent Atrial Fibrillation - He was on Coreg 12.5 mg twice daily for rate control prior to admission and this has been held. Will plan to restart at a lower dose of 3.125 mg twice daily and continue to follow on telemetry. - He remains on Coumadin for anticoagulation and denies any evidence of active bleeding. Hemoglobin was stable at 9.0 on admission which is similar to prior values.  3. Aortic Stenosis - His transthoracic echocardiogram in 06/2021 showed moderate to severe aortic stenosis and his aortic stenosis was moderate by TEE that admission. By review of notes from Dr. Candis Musa, he has preferred conservative management.   4. Abnormal Echo - Recent TEE in 06/2021 showed a large mobile echodensity in the RV and a large mobile echodensity on the aortic arch which could represent atheroma or thrombus. Cardiac MRI that admission did not show any abnormalities. He is on Coumadin for anticoagulation as outlined above.   5. ESRD - On HD - Nephrology has been consulted by the admitting team.   For questions or  updates, please contact [place] Please consult [email or web address] for contact info under    Signed, Erma Heritage, PA-C  08/03/2021 9:30 AM   Attending note:  Patient seen and examined.  I reviewed his records and discussed the case with Ms. Delano Metz, I agree with her above findings.  Mr. Levada Dy presents after 2 recent episodes of syncope that occurred while standing, one after finishing dialysis session, and the other while walking into his house.  He has a complicated medical history including chronic hypoxic respiratory failure on supplemental oxygen, moderate to severe aortic stenosis, ESRD on hemodialysis, and permanent atrial fibrillation on Coumadin.  He is followed by Dr. Candis Musa with [facility] cardiology, I reviewed his most recent note and also evaluation done at [facility].  On examination he is in no distress.  Afebrile, heart rate recently in the 60s and 70s in atrial fibrillation by telemetry which I personally reviewed.  Systolic blood pressure 244-010 range.  Lungs exhibit decreased breath sounds at the bases.  Cardiac exam with irregularly irregular rhythm and 3/6 stock murmur consistent with aortic stenosis.  Pertinent lab work includes potassium 4.3, BUN 42, creatinine 3.63, high-sensitivity troponin I 28, hemoglobin 8.9, platelets 180, INR 2.0, SARS coronavirus 2 test negative.  Head CT reports no acute findings, primary service has ordered carotid Dopplers which are pending.  Recent echocardiographic examinations and cardiac MRI at [facility] reviewed.  There was question of mobile echodensity in the RV that was ultimately felt to be a prominent trabeculation, however still question of atheroma or thrombus in the aortic arch.  He is anticoagulated with Coumadin with known atrial fibrillation.  Also moderate to severe aortic stenosis that has been managed conservatively per Dr. Candis Musa.  Does not necessarily need a repeat echocardiogram at this time given recent  study in July with report in [place].  Agree with holding Coreg initially, can most likely resume much lower dose of 3.125 mg twice daily and follow heart rate control of atrial fibrillation on telemetry.  Continue Coumadin.  Midodrine is used  only on hemodialysis days, this may need to be adjusted depending on his baseline blood pressure and could also be taken 3 times daily if necessary.  We will follow.  Satira Sark, M.D., F.A.C.C.

## 2021-08-03 NOTE — Progress Notes (Signed)
ANTICOAGULATION CONSULT NOTE - Initial Consult  Pharmacy Consult for Warfarin Indication: atrial fibrillation  Allergies  Allergen Reactions   Lisinopril Other (See Comments)    Other reaction(s): Other (See Comments) Mouth,facial swelling  Mouth,facial swelling  Mouth,facial swelling      Patient Measurements: Height: 5\' 10"  (177.8 cm) Weight: 80 kg (176 lb 5.9 oz) IBW/kg (Calculated) : 73  Vital Signs: Temp: 97.9 F (36.6 C) (08/31 1824) Temp Source: Axillary (08/31 1824) BP: 131/73 (08/31 2209) Pulse Rate: 62 (08/31 2209)  Labs: Recent Labs    08/02/21 1901 08/02/21 2157  HGB 9.0*  --   HCT 29.7*  --   PLT 199  --   LABPROT 24.0*  --   INR 2.2*  --   CREATININE 3.04*  --   TROPONINIHS 27* 28*    Estimated Creatinine Clearance: 19.7 mL/min (A) (by C-G formula based on SCr of 3.04 mg/dL (H)).   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   Atrial fibrillation (HCC)    On Coumadin   Basal cell carcinoma 01/08/2017   right forearm ( txpbx)   COPD (chronic obstructive pulmonary disease) (HCC)    Deceased-donor kidney transplant 2015   Diabetic nephropathy (HCC)    End stage renal disease (HCC)    FSGS (focal segmental glomerulosclerosis)    HLD (hyperlipidemia)    HTN (hypertension)    Long-term use of immunosuppressant medication    SCC (squamous cell carcinoma) 05/10/2015   right side of face (cx59fu)   SCC (squamous cell carcinoma) 04/25/2016   right post scalp ( cx64fu)   SCC (squamous cell carcinoma) 08/27/2017   right post scalp (txpbx) insitu   SCC (squamous cell carcinoma) 05/05/2018   back of scalp (mohs) insitu   SCC (squamous cell carcinoma) 10/21/2018   top of right hand (txpbx) well diff   SCC (squamous cell carcinoma) 10/21/2018   back of crown (txpbx) insitu   SCC (squamous cell carcinoma) 06/18/2019   back of crown ( cx84fu) insitu   Squamous cell  carcinoma of skin 05/10/2015   right crown insitu (cx13fu)   Type 2 diabetes mellitus (HCC)     Medications:  See electronic med rec  Assessment: 80 y.o. M presents with episodes of SOB. Pt on warfarin PTA for afib. Admission INR 2.2 - therapeutic. CBC ok on admission. Home dose: 3mg  alternating with 6mg  - took 3mg  on 8/30   Goal of Therapy:  INR 2-3 Monitor platelets by anticoagulation protocol: Yes   Plan:  Daily INR Continue warfarin 6mg  alternating with 3mg  - 6mg  now  Sherlon Handing, PharmD, BCPS Please see amion for complete clinical pharmacist phone list 08/03/2021,12:36 AM

## 2021-08-03 NOTE — H&P (Addendum)
History and Physical    Kevin Frey OIN:[id number] DOB: [birth year] DOA: 08/02/2021  PCP: Caryl Bis, MD   Patient coming from: Home.  I have personally briefly reviewed patient's old medical records in [place]  Chief Complaint: Syncopal episode.  HPI: Kevin Frey is a 80 y.o. male with medical history significant of other nonhemorrhagic thalamic stroke, chronic atrial fibrillation, COPD, disease [facility] kidney transplant, diabetic nephropathy, ESRD on hemodialysis, hyperlipidemia hypertension, multiple SCC lacerations of the skin, type 2 diabetes who is coming to the emergency department due to having to syncopal episodes in the last 2 hemodialysis sessions that he has participated.  He denied any prodromal symptoms or otherwise chest pain, dyspnea, palpitations diaphoresis, PND or orthopnea at any point recently.  He occasionally gets lower extremity edema.  Denied fever, chills, sore throat, rhinorrhea, productive cough, wheezing or hemoptysis.  No abdominal pain, nausea, emesis, diarrhea, constipation, melena or hematochezia.  No dysuria, frequency or hematuria.  ED Course: Initial vital signs were temperature 97.9 F, pulse 76, respiration 22, BP 145/80 mmHg and O2 sat 100% on simple mask at 10 L.  Lab work: His urinalysis showed large hemoglobinuria and large leukocyte esterase.  WBC were more than 50 with a few bacteria.  CBC showed a white count 9.6, hemoglobin 9.0 g/dL platelets 199.  PT 24.0 and INR 2.2.  Troponin was 27 and then 28 ng/L.  CMP showed normal electrolytes when electrolytes corrected to glucose and albumin.  BUN was 35, creatinine 3.04 and glucose 228.  Total protein 6.1 albumin 3.0 g/dL.  Total bilirubin was 1.3 mg/dL.  Imaging: A portable 1 view chest radiograph shows stable cardiomegaly and vascular congestion.  There is mild to moderate severity bivascular atelectasis or infiltrate.  Small right pleural effusion.  CT head without contrast did not show  any acute intracranial pathology.  Review of Systems: As per HPI otherwise all other systems reviewed and are negative.  Past Medical History:  Diagnosis Date   Acute ischemic vertebrobasilar artery thalamic stroke ([place]) 2015   Atrial fibrillation (HCC)    On Coumadin   Basal cell carcinoma 01/08/2017   right forearm ( txpbx)   COPD (chronic obstructive pulmonary disease) (HCC)    Deceased-donor kidney transplant 2015   Diabetic nephropathy (HCC)    End stage renal disease (HCC)    FSGS (focal segmental glomerulosclerosis)    HLD (hyperlipidemia)    HTN (hypertension)    Long-term use of immunosuppressant medication    SCC (squamous cell carcinoma) 05/10/2015   right side of face (cx2fu)   SCC (squamous cell carcinoma) 04/25/2016   right post scalp ( cx34fu)   SCC (squamous cell carcinoma) 08/27/2017   right post scalp (txpbx) insitu   SCC (squamous cell carcinoma) 05/05/2018   back of scalp (mohs) insitu   SCC (squamous cell carcinoma) 10/21/2018   top of right hand (txpbx) well diff   SCC (squamous cell carcinoma) 10/21/2018   back of crown (txpbx) insitu   SCC (squamous cell carcinoma) 06/18/2019   back of crown ( cx4fu) insitu   Squamous cell carcinoma of skin 05/10/2015   right crown insitu (cx73fu)   Type 2 diabetes mellitus ([place])    Past Surgical History:  Procedure Laterality Date   COLONOSCOPY     Deceased donor kidney transplant  2015   Social History  reports that he has quit smoking. His smoking use included cigarettes. He has never used smokeless tobacco. He reports current alcohol use. He reports that  he does not use drugs.  Allergies  Allergen Reactions   Lisinopril Other (See Comments)    Other reaction(s): Other (See Comments) Mouth,facial swelling  Mouth,facial swelling  Mouth,facial swelling     Family History  Problem Relation Age of Onset   Colon cancer Neg Hx    Stomach cancer Neg Hx    Prior to Admission medications   Medication Sig  Start Date End Date Taking? Authorizing Provider  acetaminophen (TYLENOL) 500 MG tablet Take 500 mg by mouth in the morning and at bedtime.   Yes [provider]  carvedilol (COREG) 12.5 MG tablet Take 1 tablet (12.5 mg total) by mouth 2 (two) times daily with a meal. 07/21/21  Yes Barton Dubois, MD  cefdinir (OMNICEF) 300 MG capsule Take 300 mg by mouth daily. 07/26/21  Yes [provider]  cephALEXin (KEFLEX) 500 MG capsule Take 500 mg by mouth daily. 08/01/21  Yes [provider]  Cholecalciferol 25 MCG (1000 UT) tablet Take by mouth daily.   Yes [provider]  cycloSPORINE modified (NEORAL) 100 MG capsule Take 1 capsule by mouth 2 (two) times daily. 07/07/21 07/07/22 Yes [provider]  diclofenac Sodium (VOLTAREN) 1 % GEL Apply topically as needed.   Yes [provider]  finasteride (PROSCAR) 5 MG tablet Take 1 tablet (5 mg total) by mouth daily. 07/21/21  Yes Barton Dubois, MD  insulin glargine (LANTUS) 100 UNIT/ML injection Inject 6 Units into the skin at bedtime. 02/03/16  Yes [provider]  ipratropium-albuterol (DUONEB) 0.5-2.5 (3) MG/3ML SOLN Inhale 3 mLs into the lungs every 6 (six) hours as needed (SOB/wheezing). 07/21/21  Yes Barton Dubois, MD  midodrine (PROAMATINE) 5 MG tablet Take 1 tablet (5 mg total) by mouth 2 (two) times daily with a meal. Only on HD days. Patient taking differently: Take 10 mg by mouth 2 (two) times daily with a meal. Only on HD days. 07/20/21  Yes Barton Dubois, MD  multivitamin (RENA-VIT) TABS tablet Take 1 tablet by mouth daily. 07/03/21  Yes [provider]  mycophenolate (MYFORTIC) 180 MG EC tablet Take 1 tablet (180 mg total) by mouth 2 (two) times daily. Patient taking differently: Take 360 mg by mouth 2 (two) times daily. 07/20/21 07/20/22 Yes Barton Dubois, MD  Omega 3 1000 MG CAPS Take 1 capsule by mouth daily.   Yes [provider]  pantoprazole (PROTONIX) 40 MG tablet Take 40  mg by mouth 2 (two) times daily. 06/21/21 08/20/21 Yes [provider]  tamsulosin (FLOMAX) 0.4 MG CAPS capsule Take 0.4 mg by mouth 2 (two) times daily. 08/08/20  Yes [provider]  warfarin (COUMADIN) 6 MG tablet Take 6 mg by mouth See admin instructions. Take 6 mg every other day and 3 mg on opposite days. 07/21/21  Yes [provider]  azithromycin (ZITHROMAX) 250 MG tablet Take 250 mg by mouth as directed. Patient not taking: Reported on 08/02/2021 08/01/21   [provider]  Continuous Blood Gluc Receiver (FREESTYLE LIBRE 14 DAY READER) [place] See admin instructions. 07/18/18   [provider]  Continuous Blood Gluc Sensor (FREESTYLE LIBRE 14 DAY SENSOR) MISC  12/05/19   [provider]  warfarin (COUMADIN) 10 MG tablet Take 6 mg by mouth daily. Take 3mg  every other day and 6mg  on opposite days Patient not taking: Reported on 08/02/2021 02/23/20   [provider]   Physical Exam: Vitals:   08/02/21 1845 08/02/21 2015 08/02/21 2024 08/02/21 2209  BP:  [name])  135/59  131/73  Pulse: 66 62  62  Resp: (!) 22 (!) 24  20  Temp:      TempSrc:      SpO2: 100% 100% 99% 97%  Weight:      Height:       Constitutional: Chronically ill-appearing.  NAD, calm, comfortable Eyes: PERRL, lids and conjunctivae normal ENMT: Mucous membranes are moist. Posterior pharynx clear of any exudate or lesions. Neck: normal, supple, no masses, no thyromegaly Respiratory: clear to auscultation bilaterally, no wheezing, no crackles. Normal respiratory effort. No accessory muscle use.   Chest: Right-sided subclavian Shiley cath. Cardiovascular: Sinus bradycardia in the 50s, 2/6 SEM, no murmurs / rubs / gallops. No extremity edema. 2+ pedal pulses. No carotid bruits.  Abdomen: No distention.  Bowel sounds positive.  Soft, no tenderness, no masses palpated. No hepatosplenomegaly. Musculoskeletal: Mild generalized weakness.  No clubbing / cyanosis. Good ROM, no  contractures. Normal muscle tone.  Skin: No rashes, lesions, ulcers on very limited dermatological examination. Neurologic: CN 2-12 grossly intact. Sensation intact, DTR normal. Strength 5/5 in all 4.  Psychiatric: Normal judgment and insight. Alert and oriented x 3. Normal mood.   Labs on Admission: I have personally reviewed following labs and imaging studies  CBC: Recent Labs  Lab 08/02/21 1901  WBC 9.6  NEUTROABS 8.1*  HGB 9.0*  HCT 29.7*  MCV 91.7  PLT 626   Basic Metabolic Panel: Recent Labs  Lab 08/02/21 1901  NA 133*  K 4.4  CL 96*  CO2 28  GLUCOSE 228*  BUN 35*  CREATININE 3.04*  CALCIUM 7.9*   GFR: Estimated Creatinine Clearance: 19.7 mL/min (A) (by C-G formula based on SCr of 3.04 mg/dL (H)).  Liver Function Tests: Recent Labs  Lab 08/02/21 1901  AST 30  ALT 24  ALKPHOS 74  BILITOT 1.3*  PROT 6.1*  ALBUMIN 3.0*   Urine analysis:    Component Value Date/Time   COLORURINE YELLOW 08/02/2021 2126   APPEARANCEUR CLOUDY (A) 08/02/2021 2126   LABSPEC 1.002 (L) 08/02/2021 2126   PHURINE 5.0 08/02/2021 2126   GLUCOSEU NEGATIVE 08/02/2021 2126   HGBUR LARGE (A) 08/02/2021 2126   BILIRUBINUR NEGATIVE 08/02/2021 2126   KETONESUR NEGATIVE 08/02/2021 2126   PROTEINUR 100 (A) 08/02/2021 2126   NITRITE NEGATIVE 08/02/2021 2126   LEUKOCYTESUR LARGE (A) 08/02/2021 2126   Radiological Exams on Admission: CT HEAD WO CONTRAST (5MM)  Result Date: 08/02/2021 CLINICAL DATA:  Head trauma. EXAM: CT HEAD WITHOUT CONTRAST TECHNIQUE: Contiguous axial images were obtained from the base of the skull through the vertex without intravenous contrast. COMPARISON:  Head CT dated 07/31/2021. FINDINGS: Brain: There is moderate age-related atrophy and chronic microvascular ischemic changes. Bilateral basal ganglia calcifications noted. There is no acute intracranial hemorrhage. No mass effect or midline shift. No extra-axial fluid collection. Vascular: No hyperdense vessel or  unexpected calcification. Skull: Normal. Negative for fracture or focal lesion. Sinuses/Orbits: Complete opacification of the visualized maxillary and sphenoid sinuses. No air-fluid level. The mastoid air cells are clear. Other: None IMPRESSION: 1. No acute intracranial pathology. 2. Moderate age-related atrophy and chronic microvascular ischemic changes. 3. Paranasal sinus disease. Electronically Signed   By: Anner Crete M.D.   On: 08/02/2021 19:43   DG Chest Portable 1 View  Result Date: 08/02/2021 CLINICAL DATA:  Shortness of breath. EXAM: PORTABLE CHEST 1 VIEW COMPARISON:  July 31, 2021 FINDINGS: There is stable right internal jugular venous catheter positioning. Low lung volumes are seen with chronic appearing  increased interstitial lung markings. Mild to moderate severity areas of atelectasis and/or infiltrate are again seen within the bilateral lung bases. There is a small right pleural effusion. No pneumothorax is identified. The cardiac silhouette is enlarged and unchanged in size. The visualized skeletal structures are unremarkable. IMPRESSION: 1. Stable cardiomegaly and vascular congestion mild to moderate severity bibasilar atelectasis and/or infiltrate. 2. Small right pleural effusion. Electronically Signed   By: Virgina Norfolk M.D.   On: 08/02/2021 19:52    06/19/2021 outside facility transesophageal echocardiogram. SUMMARY  Cannot fully assess LV function with images obtained.  The right ventricle is normal size.  The right ventricular systolic function is normal.  The left atrial size is normal.  No thrombus is detected in the left atrial appendage.  Diffuse thickening of the aortic valve with restricted cusp opening.  Planimetered valve area in 2D echo is 1.1 cm2  There is mild aortic regurgitation.  There is moderate aortic stenosis.  There is mild mitral regurgitation.  Structurally normal tricuspid valve.  No tricuspid regurgitation.  There is no pericardial  effusion.  Mobile echodensity in the RV attached to the TV chordae, measuring  1.6cm x 0.89cm, best seen in clip 19. Clinical correlation advised.  Consider CMR for further characterization if clinically indicated.  There is a large mobile echodensity on the aortic arch, measured  2.3cmx1.1cm, seen in clip 49. Differential include atheroma and  thrombus.   -  FINDINGS:  LEFT VENTRICLE  Limited study, not performed. Cannot fully assess LV function with  images obtained.   -  RIGHT VENTRICLE  The right ventricle is normal size. The right ventricular systolic  function is normal. Mobile echodensity in the RV attached to the TV  chordae, measuring 1.6cm x 0.89cm, best seen in clip 19. Clinical  correlation advised. Consider CMR for further characterization if  clinically indicated.   LEFT ATRIUM  The left atrial size is normal. No thrombus is detected in the left  atrial appendage. Left atrial appendage velocities are reduced.   RIGHT ATRIUM  Limited study, not performed. Limited study. Not performed.  -  AORTIC VALVE  Diffuse thickening of the aortic valve with restricted cusp opening.  There is mild aortic regurgitation. There is moderate aortic stenosis.  planimetered valve area in 2D echo is 1.1cm2.  -  MITRAL VALVE  There is mild mitral annular calcification. Focally calcified mitral  leaflet. There is mild mitral regurgitation.  -  TRICUSPID VALVE  Structurally normal tricuspid valve. No tricuspid regurgitation.  -  PULMONIC VALVE  Structurally normal pulmonic valve. There is no pulmonic valvular  regurgitation.  -  ARTERIES  There is a large mobile echodensity on the aortic arch, measured  2.3cmx1.1cm, seen in clip 49. Differential include atheroma and  thrombus.  -  -  EFFUSION  There is no pericardial effusion.  -   EKG: Independently reviewed.  Vent. rate 74 BPM PR interval * ms QRS duration 113 ms QT/QTcB 446/495 ms P-R-T axes * 30 26 Atrial  fibrillation Borderline intraventricular conduction delay Abnormal T, consider ischemia, anterior leads  Assessment/Plan Principal Problem:   Syncope and collapse Observation/telemetry. Hold carvedilol and tamsulosin. Check carotid Doppler. Check echocardiogram to evaluate LV function. Routine cardiology consult in the morning.  Active Problems:   Atrial fibrillation, chronic (HCC) CHA?DS?-VASc Score of at least 5. Continue warfarin per pharmacy. Carvedilol held in the setting of syncopal episodes.    Diabetes mellitus, type II, insulin dependent (HCC) Carbohydrate modified diet. Continue insulin glargine  60 units at bedtime.    ESRD (end stage renal disease) [facility]) Consult nephrology if dialysis needed.    Hypoalbuminemia Protein supplementation. Consider nutritional services evaluation.    Anemia in ESRD (end-stage renal disease) (HCC) Monitor hematocrit and hemoglobin. Erythropoietin per nephrology.    DVT prophylaxis: On apixaban. Code Status:   Full code. Family Communication:   Disposition Plan:   Patient is from:  Home.  Anticipated DC to:  Home.  Anticipated DC date:  08/03/2021 or 08/04/2021.  Anticipated DC barriers: Clinical status/consultant sign off. Consults called:  Routine cardiology consult.  Will need nephrology consult if HD needed. Admission status:  Observation/telemetry.  Severity of Illness:  High severity due to presenting with history of multiple syncopal and collapse episodes associated with hemodialysis.  Reubin Milan MD [facility]  How to contact the [facility] Attending or Consulting provider [place] or covering provider during after hours [place], for this patient?   Check the care team in [facility] and look for a) attending/consulting [facility] provider listed and b) the [facility] team listed Log into [email or web address] and use [place]'s universal password to access. If you do not have the password, please contact the hospital operator. Locate  the [facility] provider you are looking for under [facility] and page to a number that you can be directly reached. If you still have difficulty reaching the provider, please page the [facility] (Director on Call) for the Hospitalists listed on amion for assistance.  08/03/2021, 12:06 AM   This document was created using Dragon voice recognition software and may contain some unintended transcription errors.

## 2021-08-03 NOTE — ED Notes (Signed)
Unable to get pt up for orthos at this time .

## 2021-08-03 NOTE — Progress Notes (Signed)
ANTICOAGULATION CONSULT NOTE -   Pharmacy Consult for Warfarin Indication: atrial fibrillation  Allergies  Allergen Reactions   Lisinopril Other (See Comments)    Other reaction(s): Other (See Comments) Mouth,facial swelling  Mouth,facial swelling  Mouth,facial swelling      Patient Measurements: Height: 5\' 10"  (177.8 cm) Weight: 80 kg (176 lb 5.9 oz) IBW/kg (Calculated) : 73  Vital Signs: BP: 109/69 (09/01 0430) Pulse Rate: 65 (09/01 0430)  Labs: Recent Labs    08/02/21 1901 08/02/21 2157 08/03/21 0419  HGB 9.0*  --   --   HCT 29.7*  --   --   PLT 199  --   --   LABPROT 24.0*  --  22.2*  INR 2.2*  --  2.0*  CREATININE 3.04*  --   --   TROPONINIHS 27* 28*  --      Estimated Creatinine Clearance: 19.7 mL/min (A) (by C-G formula based on SCr of 3.04 mg/dL (H)).   Medical History: Past Medical History:  Diagnosis Date   Acute ischemic vertebrobasilar artery thalamic stroke ([place]) 2015   Atrial fibrillation (HCC)    On Coumadin   Basal cell carcinoma 01/08/2017   right forearm ( txpbx)   COPD (chronic obstructive pulmonary disease) (HCC)    Deceased-donor kidney transplant 2015   Diabetic nephropathy (HCC)    End stage renal disease (HCC)    FSGS (focal segmental glomerulosclerosis)    HLD (hyperlipidemia)    HTN (hypertension)    Long-term use of immunosuppressant medication    SCC (squamous cell carcinoma) 05/10/2015   right side of face (cx45fu)   SCC (squamous cell carcinoma) 04/25/2016   right post scalp ( cx30fu)   SCC (squamous cell carcinoma) 08/27/2017   right post scalp (txpbx) insitu   SCC (squamous cell carcinoma) 05/05/2018   back of scalp (mohs) insitu   SCC (squamous cell carcinoma) 10/21/2018   top of right hand (txpbx) well diff   SCC (squamous cell carcinoma) 10/21/2018   back of crown (txpbx) insitu   SCC (squamous cell carcinoma) 06/18/2019   back of crown ( cx87fu) insitu   Squamous cell carcinoma of skin 05/10/2015   right  crown insitu (cx5fu)   Type 2 diabetes mellitus (HCC)     Medications:  See electronic med rec  Assessment: 80 y.o. M presents with episodes of SOB. Pt on warfarin PTA for afib.  Home dose: 3mg  alternating with 6mg  - took 3mg  on 8/30  INR 2.2 > 2.0   Goal of Therapy:  INR 2-3 Monitor platelets by anticoagulation protocol: Yes   Plan:  Continue warfarin 6mg  alternating with 3mg  - 6mg  now Daily INR  Margot Ables, PharmD Clinical Pharmacist 08/03/2021 8:53 AM

## 2021-08-03 NOTE — Consult Note (Signed)
[facility]  INPATIENT CONSULTATION  Reason for Consultation: ESRD and dialysis related conditions Requesting Provider: Dr. Grandville Silos  HPI: Kevin Frey is an 80 y.o. male HTN, Afib on coumadin, COPD, ESRD s/p DDKT in 2015 with recent graft failure and re institution of dialysis on 06/09/21 who is admitted with syncope and seen for dialysis and related needs.    Admitted here 8/15-8/18/2022 with syncope and volume overload related to dialytic hypotension, sepsis.  Improved and was d/c'd on midodine.   His myfortic dose was decreased.   He presented to ED yesterday with syncope post HD.  HE was afebrile with normal WBC.  The same happened with the last HD treatment and he was seen at [facility] 8/29 for the same -- unrevealing eval, started po abx for sinusitis and discharged.     D/w outpt HD unit --HD yesterday with BP as low as 80s despite taking midodrine 20mg  pre treatment - BP remained low and at end of treatment had syncope but recovered and he refused EMS.  Syncope recurred at car and was transported to ED at time. Post wt was 83.4kg from 83.1kg pre wt, EDW 81.5kg -- no UF per RN at HD yesterday.   This AM he has no specific complaints. BP 100/60s overnight and this AM.  Recent TTE without effusion, AS noted - mod/severe.   Outpt orders: Rev 300, 4 hr, EDW 81.5kg, 2/2.5, no heparin, epo 6000 qtx and venofer 100 qtx.   PMH: Past Medical History:  Diagnosis Date   Acute ischemic vertebrobasilar artery thalamic stroke ([place]) 2015   Atrial fibrillation (HCC)    On Coumadin   Basal cell carcinoma 01/08/2017   right forearm ( txpbx)   COPD (chronic obstructive pulmonary disease) (HCC)    Deceased-donor kidney transplant 2015   Diabetic nephropathy (HCC)    End stage renal disease (HCC)    FSGS (focal segmental glomerulosclerosis)    HLD (hyperlipidemia)    HTN (hypertension)    Long-term use of immunosuppressant medication    SCC (squamous cell carcinoma) 05/10/2015    right side of face (cx14fu)   SCC (squamous cell carcinoma) 04/25/2016   right post scalp ( cx80fu)   SCC (squamous cell carcinoma) 08/27/2017   right post scalp (txpbx) insitu   SCC (squamous cell carcinoma) 05/05/2018   back of scalp (mohs) insitu   SCC (squamous cell carcinoma) 10/21/2018   top of right hand (txpbx) well diff   SCC (squamous cell carcinoma) 10/21/2018   back of crown (txpbx) insitu   SCC (squamous cell carcinoma) 06/18/2019   back of crown ( cx76fu) insitu   Squamous cell carcinoma of skin 05/10/2015   right crown insitu (cx24fu)   Type 2 diabetes mellitus (HCC)    PSH: Past Surgical History:  Procedure Laterality Date   COLONOSCOPY     Deceased donor kidney transplant  2015    Past Medical History:  Diagnosis Date   Acute ischemic vertebrobasilar artery thalamic stroke ([place]) 2015   Atrial fibrillation (HCC)    On Coumadin   Basal cell carcinoma 01/08/2017   right forearm ( txpbx)   COPD (chronic obstructive pulmonary disease) (HCC)    Deceased-donor kidney transplant 2015   Diabetic nephropathy (HCC)    End stage renal disease (HCC)    FSGS (focal segmental glomerulosclerosis)    HLD (hyperlipidemia)    HTN (hypertension)    Long-term use of immunosuppressant medication    SCC (squamous cell carcinoma) 05/10/2015   right side of  face (cx18fu)   SCC (squamous cell carcinoma) 04/25/2016   right post scalp ( cx91fu)   SCC (squamous cell carcinoma) 08/27/2017   right post scalp (txpbx) insitu   SCC (squamous cell carcinoma) 05/05/2018   back of scalp (mohs) insitu   SCC (squamous cell carcinoma) 10/21/2018   top of right hand (txpbx) well diff   SCC (squamous cell carcinoma) 10/21/2018   back of crown (txpbx) insitu   SCC (squamous cell carcinoma) 06/18/2019   back of crown ( cx80fu) insitu   Squamous cell carcinoma of skin 05/10/2015   right crown insitu (cx21fu)   Type 2 diabetes mellitus (HCC)     Medications:  I have reviewed the  patient's current medications.   Medications Prior to Admission  Medication Sig Dispense Refill   acetaminophen (TYLENOL) 500 MG tablet Take 500 mg by mouth in the morning and at bedtime.     carvedilol (COREG) 12.5 MG tablet Take 1 tablet (12.5 mg total) by mouth 2 (two) times daily with a meal. 60 tablet 1   cefdinir (OMNICEF) 300 MG capsule Take 300 mg by mouth daily.     cephALEXin (KEFLEX) 500 MG capsule Take 500 mg by mouth daily.     Cholecalciferol 25 MCG (1000 UT) tablet Take by mouth daily.     cycloSPORINE modified (NEORAL) 100 MG capsule Take 1 capsule by mouth 2 (two) times daily.     diclofenac Sodium (VOLTAREN) 1 % GEL Apply topically as needed.     finasteride (PROSCAR) 5 MG tablet Take 1 tablet (5 mg total) by mouth daily. 30 tablet 0   insulin glargine (LANTUS) 100 UNIT/ML injection Inject 6 Units into the skin at bedtime.     ipratropium-albuterol (DUONEB) 0.5-2.5 (3) MG/3ML SOLN Inhale 3 mLs into the lungs every 6 (six) hours as needed (SOB/wheezing). 360 mL 2   midodrine (PROAMATINE) 5 MG tablet Take 1 tablet (5 mg total) by mouth 2 (two) times daily with a meal. Only on HD days. (Patient taking differently: Take 10 mg by mouth 2 (two) times daily with a meal. Only on HD days.) 60 tablet 0   multivitamin (RENA-VIT) TABS tablet Take 1 tablet by mouth daily.     mycophenolate (MYFORTIC) 180 MG EC tablet Take 1 tablet (180 mg total) by mouth 2 (two) times daily. (Patient taking differently: Take 360 mg by mouth 2 (two) times daily.)     Omega 3 1000 MG CAPS Take 1 capsule by mouth daily.     pantoprazole (PROTONIX) 40 MG tablet Take 40 mg by mouth 2 (two) times daily.     tamsulosin (FLOMAX) 0.4 MG CAPS capsule Take 0.4 mg by mouth 2 (two) times daily.     warfarin (COUMADIN) 6 MG tablet Take 6 mg by mouth See admin instructions. Take 6 mg every other day and 3 mg on opposite days.     azithromycin (ZITHROMAX) 250 MG tablet Take 250 mg by mouth as directed. (Patient not  taking: Reported on 08/02/2021)     Continuous Blood Gluc Receiver (FREESTYLE LIBRE 14 DAY READER) DEVI See admin instructions.     Continuous Blood Gluc Sensor (FREESTYLE LIBRE 14 DAY SENSOR) MISC       ALLERGIES:   Allergies  Allergen Reactions   Lisinopril Other (See Comments)    Other reaction(s): Other (See Comments) Mouth,facial swelling  Mouth,facial swelling  Mouth,facial swelling      FAM HX: Family History  Problem Relation Age of Onset   Colon  cancer Neg Hx    Stomach cancer Neg Hx     Social History:   reports that he has quit smoking. His smoking use included cigarettes. He has never used smokeless tobacco. He reports current alcohol use. He reports that he does not use drugs.  ROS: 12 system ROS neg except per HPI above  Blood pressure 109/69, pulse 65, temperature 97.9 F (36.6 C), temperature source Axillary, resp. rate 14, height 5\' 10"  (1.778 m), weight 80 kg, SpO2 92 %. PHYSICAL EXAM: Gen: elderly man lying in bed comfortably  Eyes: anicteric ENT: MMM Neck:supple, no JVD CV: irreg irreg, 3/6 SEM Abd:  soft, nontender Back: lungs clear GU: foley  Extr: 1+ ankle edema Neuro: hard of hearing, otherwise nonfocla Skin: RIJ TDC c/d/i   Results for orders placed or performed during the hospital encounter of 08/02/21 (from the past 48 hour(s))  Comprehensive metabolic panel     Status: Abnormal   Collection Time: 08/02/21  7:01 PM  Result Value Ref Range   Sodium 133 (L) 135 - 145 mmol/L   Potassium 4.4 3.5 - 5.1 mmol/L   Chloride 96 (L) 98 - 111 mmol/L   CO2 28 22 - 32 mmol/L   Glucose, Bld 228 (H) 70 - 99 mg/dL    Comment: Glucose reference range applies only to samples taken after fasting for at least 8 hours.   BUN 35 (H) 8 - 23 mg/dL   Creatinine, Ser 3.04 (H) 0.61 - 1.24 mg/dL   Calcium 7.9 (L) 8.9 - 10.3 mg/dL   Total Protein 6.1 (L) 6.5 - 8.1 g/dL   Albumin 3.0 (L) 3.5 - 5.0 g/dL   AST 30 15 - 41 U/L   ALT 24 0 - 44 U/L   Alkaline  Phosphatase 74 38 - 126 U/L   Total Bilirubin 1.3 (H) 0.3 - 1.2 mg/dL   GFR, Estimated 20 (L) >60 mL/min    Comment: (NOTE) Calculated using the CKD-EPI Creatinine Equation (2021)    Anion gap 9 5 - 15    Comment: Performed at [facility], [address] [place], [place] [address]  Troponin I (High Sensitivity)     Status: Abnormal   Collection Time: 08/02/21  7:01 PM  Result Value Ref Range   Troponin I (High Sensitivity) 27 (H) <18 ng/L    Comment: (NOTE) Elevated high sensitivity troponin I (hsTnI) values and significant  changes across serial measurements may suggest ACS but many other  chronic and acute conditions are known to elevate hsTnI results.  Refer to the "Links" section for chest pain algorithms and additional  guidance. Performed at [facility], [address] [place], [place] [address]   CBC with Differential     Status: Abnormal   Collection Time: 08/02/21  7:01 PM  Result Value Ref Range   WBC 9.6 4.0 - 10.5 K/uL   RBC 3.24 (L) 4.22 - 5.81 MIL/uL   Hemoglobin 9.0 (L) 13.0 - 17.0 g/dL   HCT 29.7 (L) 39.0 - 52.0 %   MCV 91.7 80.0 - 100.0 fL   MCH 27.8 26.0 - 34.0 pg   MCHC 30.3 30.0 - 36.0 g/dL   RDW 16.9 (H) 11.5 - 15.5 %   Platelets 199 150 - 400 K/uL   nRBC 0.0 0.0 - 0.2 %   Neutrophils Relative % 84 %   Neutro Abs 8.1 (H) 1.7 - 7.7 K/uL   Lymphocytes Relative 8 %   Lymphs Abs 0.8 0.7 - 4.0 K/uL  Monocytes Relative 6 %   Monocytes Absolute 0.6 0.1 - 1.0 K/uL   Eosinophils Relative 1 %   Eosinophils Absolute 0.1 0.0 - 0.5 K/uL   Basophils Relative 0 %   Basophils Absolute 0.0 0.0 - 0.1 K/uL   Immature Granulocytes 1 %   Abs Immature Granulocytes 0.05 0.00 - 0.07 K/uL    Comment: Performed at [facility], [address] [place], [place] [address]  Protime-INR     Status: Abnormal   Collection Time: 08/02/21  7:01 PM  Result Value Ref Range   Prothrombin Time 24.0 (H) 11.4 - 15.2 seconds   INR 2.2 (H) 0.8 - 1.2    Comment: (NOTE) INR goal varies  based on device and disease states. Performed at [facility], [address], [place], [place] [address]   Urinalysis, Routine w reflex microscopic Urine, Catheterized     Status: Abnormal   Collection Time: 08/02/21  9:26 PM  Result Value Ref Range   Color, Urine YELLOW YELLOW   APPearance CLOUDY (A) CLEAR   Specific Gravity, Urine 1.002 (L) 1.005 - 1.030   pH 5.0 5.0 - 8.0   Glucose, UA NEGATIVE NEGATIVE mg/dL   Hgb urine dipstick LARGE (A) NEGATIVE   Bilirubin Urine NEGATIVE NEGATIVE   Ketones, ur NEGATIVE NEGATIVE mg/dL   Protein, ur 100 (A) NEGATIVE mg/dL   Nitrite NEGATIVE NEGATIVE   Leukocytes,Ua LARGE (A) NEGATIVE   RBC / HPF 11-20 0 - 5 RBC/hpf   WBC, UA >50 (H) 0 - 5 WBC/hpf   Bacteria, UA FEW (A) NONE SEEN   Squamous Epithelial / LPF 0-5 0 - 5   Mucus PRESENT     Comment: Performed at [facility], [address] [place], [place] [address]  Troponin I (High Sensitivity)     Status: Abnormal   Collection Time: 08/02/21  9:57 PM  Result Value Ref Range   Troponin I (High Sensitivity) 28 (H) <18 ng/L    Comment: (NOTE) Elevated high sensitivity troponin I (hsTnI) values and significant  changes across serial measurements may suggest ACS but many other  chronic and acute conditions are known to elevate hsTnI results.  Refer to the "Links" section for chest pain algorithms and additional  guidance. Performed at [facility], [address], [place], [place] [address]   Resp Panel by RT-PCR (Flu A&B, Covid) Nasopharyngeal Swab     Status: None   Collection Time: 08/02/21 11:06 PM   Specimen: Nasopharyngeal Swab; Nasopharyngeal(NP) swabs in vial transport medium  Result Value Ref Range   SARS Coronavirus 2 by RT PCR NEGATIVE NEGATIVE    Comment: (NOTE) SARS-CoV-2 target nucleic acids are NOT DETECTED.  The SARS-CoV-2 RNA is generally detectable in upper respiratory specimens during the acute phase of infection. The lowest concentration of SARS-CoV-2 viral copies this  assay can detect is 138 copies/mL. A negative result does not preclude SARS-Cov-2 infection and should not be used as the sole basis for treatment or other patient management decisions. A negative result may occur with  improper specimen collection/handling, submission of specimen other than nasopharyngeal swab, presence of viral mutation(s) within the areas targeted by this assay, and inadequate number of viral copies(<138 copies/mL). A negative result must be combined with clinical observations, patient history, and epidemiological information. The expected result is Negative.  Fact Sheet for Patients:  EntrepreneurPulse.com.au  Fact Sheet for Healthcare Providers:  IncredibleEmployment.be  This test is no t yet approved or cleared by the [place] and  has been authorized for detection and/or diagnosis of SARS-CoV-2 by FDA under an Emergency Use Authorization (EUA). This EUA will remain  in effect (meaning this test can be used) for the duration of the COVID-19 declaration under Section 564(b)(1) of the Act, 21 U.S.C.section 360bbb-3(b)(1), unless the authorization is terminated  or revoked sooner.       Influenza A by PCR NEGATIVE NEGATIVE   Influenza B by PCR NEGATIVE NEGATIVE    Comment: (NOTE) The Xpert Xpress SARS-CoV-2/FLU/RSV plus assay is intended as an aid in the diagnosis of influenza from Nasopharyngeal swab specimens and should not be used as a sole basis for treatment. Nasal washings and aspirates are unacceptable for Xpert Xpress SARS-CoV-2/FLU/RSV testing.  Fact Sheet for Patients: EntrepreneurPulse.com.au  Fact Sheet for Healthcare Providers: IncredibleEmployment.be  This test is not yet approved or cleared by the [place] FDA and has been authorized for detection and/or diagnosis of SARS-CoV-2 by FDA under an Emergency Use Authorization (EUA). This EUA will remain in  effect (meaning this test can be used) for the duration of the COVID-19 declaration under Section 564(b)(1) of the Act, 21 U.S.C. section 360bbb-3(b)(1), unless the authorization is terminated or revoked.  Performed at [facility], [address] [place], [place] [address]   Protime-INR     Status: Abnormal   Collection Time: 08/03/21  4:19 AM  Result Value Ref Range   Prothrombin Time 22.2 (H) 11.4 - 15.2 seconds   INR 2.0 (H) 0.8 - 1.2    Comment: (NOTE) INR goal varies based on device and disease states. Performed at [facility], [address] [place], [place] [address]   Renal function panel     Status: Abnormal   Collection Time: 08/03/21  9:24 AM  Result Value Ref Range   Sodium 133 (L) 135 - 145 mmol/L   Potassium 4.3 3.5 - 5.1 mmol/L   Chloride 98 98 - 111 mmol/L   CO2 28 22 - 32 mmol/L   Glucose, Bld 144 (H) 70 - 99 mg/dL    Comment: Glucose reference range applies only to samples taken after fasting for at least 8 hours.   BUN 42 (H) 8 - 23 mg/dL   Creatinine, Ser 3.63 (H) 0.61 - 1.24 mg/dL   Calcium 8.2 (L) 8.9 - 10.3 mg/dL   Phosphorus 5.4 (H) 2.5 - 4.6 mg/dL   Albumin 2.9 (L) 3.5 - 5.0 g/dL   GFR, Estimated 16 (L) >60 mL/min    Comment: (NOTE) Calculated using the CKD-EPI Creatinine Equation (2021)    Anion gap 7 5 - 15    Comment: Performed at [facility], [address], [place],  [address]  CBC with Differential/Platelet     Status: Abnormal   Collection Time: 08/03/21  9:24 AM  Result Value Ref Range   WBC 7.1 4.0 - 10.5 K/uL   RBC 3.20 (L) 4.22 - 5.81 MIL/uL   Hemoglobin 8.9 (L) 13.0 - 17.0 g/dL   HCT 29.0 (L) 39.0 - 52.0 %   MCV 90.6 80.0 - 100.0 fL   MCH 27.8 26.0 - 34.0 pg   MCHC 30.7 30.0 - 36.0 g/dL   RDW 17.0 (H) 11.5 - 15.5 %   Platelets 180 150 - 400 K/uL   nRBC 0.0 0.0 - 0.2 %   Neutrophils Relative % 68 %   Neutro Abs 4.9 1.7 - 7.7 K/uL   Lymphocytes Relative 18 %   Lymphs Abs 1.3 0.7 - 4.0 K/uL   Monocytes Relative 11 %  Monocytes Absolute 0.8 0.1 - 1.0 K/uL   Eosinophils Relative 2 %   Eosinophils Absolute 0.1 0.0 - 0.5 K/uL   Basophils Relative 1 %   Basophils Absolute 0.0 0.0 - 0.1 K/uL   Immature Granulocytes 0 %   Abs Immature Granulocytes 0.03 0.00 - 0.07 K/uL    Comment: Performed at [facility], [address] [place], [place] [address]    CT HEAD WO CONTRAST (5MM)  Result Date: 08/02/2021 CLINICAL DATA:  Head trauma. EXAM: CT HEAD WITHOUT CONTRAST TECHNIQUE: Contiguous axial images were obtained from the base of the skull through the vertex without intravenous contrast. COMPARISON:  Head CT dated 07/31/2021. FINDINGS: Brain: There is moderate age-related atrophy and chronic microvascular ischemic changes. Bilateral basal ganglia calcifications noted. There is no acute intracranial hemorrhage. No mass effect or midline shift. No extra-axial fluid collection. Vascular: No hyperdense vessel or unexpected calcification. Skull: Normal. Negative for fracture or focal lesion. Sinuses/Orbits: Complete opacification of the visualized maxillary and sphenoid sinuses. No air-fluid level. The mastoid air cells are clear. Other: None IMPRESSION: 1. No acute intracranial pathology. 2. Moderate age-related atrophy and chronic microvascular ischemic changes. 3. Paranasal sinus disease. Electronically Signed   By: Anner Crete M.D.   On: 08/02/2021 19:43   DG Chest Portable 1 View  Result Date: 08/02/2021 CLINICAL DATA:  Shortness of breath. EXAM: PORTABLE CHEST 1 VIEW COMPARISON:  July 31, 2021 FINDINGS: There is stable right internal jugular venous catheter positioning. Low lung volumes are seen with chronic appearing increased interstitial lung markings. Mild to moderate severity areas of atelectasis and/or infiltrate are again seen within the bilateral lung bases. There is a small right pleural effusion. No pneumothorax is identified. The cardiac silhouette is enlarged and unchanged in size. The visualized skeletal  structures are unremarkable. IMPRESSION: 1. Stable cardiomegaly and vascular congestion mild to moderate severity bibasilar atelectasis and/or infiltrate. 2. Small right pleural effusion. Electronically Signed   By: Virgina Norfolk M.D.   On: 08/02/2021 19:52    Assessment/Plan **Syncope:  Temporally related to HD.  Limited UF with HD yesterday - in fact left net + after treatment yesterday due to fluid boluses given during treatment.  Has modest peripheral edema currently.  Cardiology is following - appreciate input.  Plan is to hold coreg today, resume at lower dose tomorrow and possibly increase midodrine dose.  Please place compression stockings if possible too.    **ESRD on HD:  Completed full HD yesterday - labs and volume OK today. Will plan for HD tomorrow with maneuvers to support BP -- pretx midodrine, low temp, UF profiling if needs UF.  Follow I/Os and consider adding a diuretic to limit need for UF with HD while maintaining euvolemia.  **Anemia:  Hb 8.9 - outpt receiving IV iron load and epogen 6000qtx; will cont IV iron here and sub aranesp.   **Secondary hyperparathyroidism:  Phos 5.4, doesn't require binders.  PTH followed outpt  **BPH, in dwelling foley: UA with WBC on admission; culture is pending.   **s/p renal transplant: back on HD since 06/2021 - still on low dose myfortic, cont for now.  Justin Mend 08/03/2021, 10:02 AM

## 2021-08-03 NOTE — Evaluation (Signed)
Clinical/Bedside Swallow Evaluation Patient Details  Name: Kevin Frey MRN: [id number] Date of Birth: Jan 15, 1941  Today's Date: 08/03/2021 Time: SLP Start Time (ACUTE ONLY): 3267 SLP Stop Time (ACUTE ONLY): 1245 SLP Time Calculation (min) (ACUTE ONLY): 25 min  Past Medical History:  Past Medical History:  Diagnosis Date   Acute ischemic vertebrobasilar artery thalamic stroke ([place]) 2015   Atrial fibrillation (HCC)    On Coumadin   Basal cell carcinoma 01/08/2017   right forearm ( txpbx)   COPD (chronic obstructive pulmonary disease) ([place])    Deceased-donor kidney transplant 2015   Diabetic nephropathy (HCC)    End stage renal disease (HCC)    FSGS (focal segmental glomerulosclerosis)    HLD (hyperlipidemia)    HTN (hypertension)    Long-term use of immunosuppressant medication    SCC (squamous cell carcinoma) 05/10/2015   right side of face (cx47fu)   SCC (squamous cell carcinoma) 04/25/2016   right post scalp ( cx53fu)   SCC (squamous cell carcinoma) 08/27/2017   right post scalp (txpbx) insitu   SCC (squamous cell carcinoma) 05/05/2018   back of scalp (mohs) insitu   SCC (squamous cell carcinoma) 10/21/2018   top of right hand (txpbx) well diff   SCC (squamous cell carcinoma) 10/21/2018   back of crown (txpbx) insitu   SCC (squamous cell carcinoma) 06/18/2019   back of crown ( cx31fu) insitu   Squamous cell carcinoma of skin 05/10/2015   right crown insitu (cx77fu)   Type 2 diabetes mellitus ([place])    Past Surgical History:  Past Surgical History:  Procedure Laterality Date   COLONOSCOPY     Deceased donor kidney transplant  2015   HPI:  Kevin Frey is a 80 y.o. male with medical history significant of other nonhemorrhagic thalamic stroke, chronic atrial fibrillation, COPD, disease Stogner kidney transplant, diabetic nephropathy, ESRD on hemodialysis, hyperlipidemia hypertension, multiple SCC lacerations of the skin, type 2 diabetes who is coming to the emergency  department due to having to syncopal episodes in the last 2 hemodialysis sessions that he has participated.  He denied any prodromal symptoms or otherwise chest pain, dyspnea, palpitations diaphoresis, PND or orthopnea at any point recently.  He occasionally gets lower extremity edema.  Denied fever, chills, sore throat, rhinorrhea, productive cough, wheezing or hemoptysis.  No abdominal pain, nausea, emesis, diarrhea, constipation, melena or hematochezia.  No dysuria, frequency or hematuria. A portable 1 view chest radiograph shows stable cardiomegaly and vascular congestion.  There is mild to moderate severity bivascular atelectasis or infiltrate.  Small right pleural effusion.  CT head without contrast did not show any acute intracranial pathology. BSE requested.   Assessment / Plan / Recommendation Clinical Impression  Clinical swallow evaluation completed at bedside with wife present. Pt reportedly last had MBSS at [facility] in July with recommendation for soft solids and HTL. He has been consuming this diet at home per Pt/wife, but drinks thin water between meals and thin coffee. Oral motor examination is WNL. Pt assessed with solids, NTL, and thin water. Pt with delayed cough following sips of thin water and no overt signs of reduced airway protection with 4 oz NTL. Unfortunately, I could not access a copy of this most recent MBSS in July. His wife indicates that he had a "few" swallow studies and was discharged on HTL, but has not had follow up SLP services since then Recommend D3 and HTL for now and consider repeat MBSS when appropriate (possibly as an outpatient due to holiday weekend) to  hopefully liberalize liquids. SLP will follow during acute stay. Pt and wife are in agreement with plan of care.   SLP Visit Diagnosis: Dysphagia, oropharyngeal phase (R13.12)    Aspiration Risk  Moderate aspiration risk    Diet Recommendation Dysphagia 3 (Mech soft);Honey-thick liquid   Liquid Administration via:  Cup;Straw Medication Administration: Whole meds with puree Supervision: Patient able to self feed;Intermittent supervision to cue for compensatory strategies Compensations: Slow rate;Small sips/bites;Multiple dry swallows after each bite/sip Postural Changes: Seated upright at 90 degrees;Remain upright for at least 30 minutes after po intake    Other  Recommendations Oral Care Recommendations: Oral care BID;Staff/trained caregiver to provide oral care Other Recommendations: Clarify dietary restrictions;Order thickener from pharmacy   Follow up Recommendations  (consider outpatient MBSS in a few weeks)      Frequency and Duration min 2x/week  1 week       Prognosis Prognosis for Safe Diet Advancement: Fair Barriers to Reach Goals: Time post onset      Swallow Study   General Date of Onset: 08/02/21 HPI: Kevin Frey is a 81 y.o. male with medical history significant of other nonhemorrhagic thalamic stroke, chronic atrial fibrillation, COPD, disease Stogner kidney transplant, diabetic nephropathy, ESRD on hemodialysis, hyperlipidemia hypertension, multiple SCC lacerations of the skin, type 2 diabetes who is coming to the emergency department due to having to syncopal episodes in the last 2 hemodialysis sessions that he has participated.  He denied any prodromal symptoms or otherwise chest pain, dyspnea, palpitations diaphoresis, PND or orthopnea at any point recently.  He occasionally gets lower extremity edema.  Denied fever, chills, sore throat, rhinorrhea, productive cough, wheezing or hemoptysis.  No abdominal pain, nausea, emesis, diarrhea, constipation, melena or hematochezia.  No dysuria, frequency or hematuria. A portable 1 view chest radiograph shows stable cardiomegaly and vascular congestion.  There is mild to moderate severity bivascular atelectasis or infiltrate.  Small right pleural effusion.  CT head without contrast did not show any acute intracranial pathology. BSE  requested. Type of Study: Bedside Swallow Evaluation Previous Swallow Assessment: MBSS [facility] reg/HTL 06/20/21 Diet Prior to this Study: Dysphagia 3 (soft);Honey-thick liquids Temperature Spikes Noted: No Respiratory Status: Nasal cannula History of Recent Intubation: No Behavior/Cognition: Alert;Cooperative;Pleasant mood Oral Cavity Assessment: Within Functional Limits Oral Care Completed by SLP: Recent completion by staff Oral Cavity - Dentition: Adequate natural dentition Vision: Functional for self-feeding Self-Feeding Abilities: Able to feed self Patient Positioning: Upright in bed Baseline Vocal Quality: Normal Volitional Cough: Strong Volitional Swallow: Able to elicit    Oral/Motor/Sensory Function Overall Oral Motor/Sensory Function: Within functional limits   Ice Chips Ice chips: Within functional limits Presentation: Spoon   Thin Liquid Thin Liquid: Impaired Presentation: Cup;Self Fed Pharyngeal  Phase Impairments: Cough - Delayed    Nectar Thick Nectar Thick Liquid: Within functional limits Presentation: Cup;Self Fed   Honey Thick Honey Thick Liquid: Not tested   Puree Puree: Within functional limits Presentation: Spoon   Solid     Solid: Within functional limits Presentation: Self Fed     Thank you,  Genene Churn, [place]  Caileigh Canche 08/03/2021,3:23 PM

## 2021-08-04 DIAGNOSIS — I4821 Permanent atrial fibrillation: Secondary | ICD-10-CM | POA: Diagnosis not present

## 2021-08-04 DIAGNOSIS — D631 Anemia in chronic kidney disease: Secondary | ICD-10-CM | POA: Diagnosis not present

## 2021-08-04 DIAGNOSIS — Z794 Long term (current) use of insulin: Secondary | ICD-10-CM | POA: Diagnosis not present

## 2021-08-04 DIAGNOSIS — Z940 Kidney transplant status: Secondary | ICD-10-CM | POA: Diagnosis not present

## 2021-08-04 DIAGNOSIS — N186 End stage renal disease: Secondary | ICD-10-CM | POA: Diagnosis not present

## 2021-08-04 DIAGNOSIS — I482 Chronic atrial fibrillation, unspecified: Secondary | ICD-10-CM | POA: Diagnosis not present

## 2021-08-04 DIAGNOSIS — I120 Hypertensive chronic kidney disease with stage 5 chronic kidney disease or end stage renal disease: Secondary | ICD-10-CM | POA: Diagnosis not present

## 2021-08-04 DIAGNOSIS — I350 Nonrheumatic aortic (valve) stenosis: Secondary | ICD-10-CM | POA: Diagnosis not present

## 2021-08-04 DIAGNOSIS — R55 Syncope and collapse: Secondary | ICD-10-CM | POA: Diagnosis not present

## 2021-08-04 DIAGNOSIS — E119 Type 2 diabetes mellitus without complications: Secondary | ICD-10-CM | POA: Diagnosis not present

## 2021-08-04 DIAGNOSIS — D649 Anemia, unspecified: Secondary | ICD-10-CM | POA: Diagnosis not present

## 2021-08-04 DIAGNOSIS — Z992 Dependence on renal dialysis: Secondary | ICD-10-CM | POA: Diagnosis not present

## 2021-08-04 DIAGNOSIS — N2581 Secondary hyperparathyroidism of renal origin: Secondary | ICD-10-CM | POA: Diagnosis not present

## 2021-08-04 LAB — RENAL FUNCTION PANEL
Albumin: 2.8 g/dL — ABNORMAL LOW (ref 3.5–5.0)
Anion gap: 13 (ref 5–15)
BUN: 53 mg/dL — ABNORMAL HIGH (ref 8–23)
CO2: 28 mmol/L (ref 22–32)
Calcium: 8.5 mg/dL — ABNORMAL LOW (ref 8.9–10.3)
Chloride: 94 mmol/L — ABNORMAL LOW (ref 98–111)
Creatinine, Ser: 4.4 mg/dL — ABNORMAL HIGH (ref 0.61–1.24)
GFR, Estimated: 13 mL/min — ABNORMAL LOW (ref >60)
Glucose, Bld: 162 mg/dL — ABNORMAL HIGH (ref 70–99)
Phosphorus: 6.2 mg/dL — ABNORMAL HIGH (ref 2.5–4.6)
Potassium: 4.8 mmol/L (ref 3.5–5.1)
Sodium: 135 mmol/L (ref 135–145)

## 2021-08-04 LAB — CBC WITH DIFFERENTIAL/PLATELET
Abs Immature Granulocytes: 0.03 10*3/uL (ref 0.00–0.07)
Basophils Absolute: 0 10*3/uL (ref 0.0–0.1)
Basophils Relative: 1 %
Eosinophils Absolute: 0.3 10*3/uL (ref 0.0–0.5)
Eosinophils Relative: 4 %
HCT: 29.2 % — ABNORMAL LOW (ref 39.0–52.0)
Hemoglobin: 8.7 g/dL — ABNORMAL LOW (ref 13.0–17.0)
Immature Granulocytes: 0 %
Lymphocytes Relative: 22 %
Lymphs Abs: 1.7 10*3/uL (ref 0.7–4.0)
MCH: 27.4 pg (ref 26.0–34.0)
MCHC: 29.8 g/dL — ABNORMAL LOW (ref 30.0–36.0)
MCV: 92.1 fL (ref 80.0–100.0)
Monocytes Absolute: 0.9 10*3/uL (ref 0.1–1.0)
Monocytes Relative: 12 %
Neutro Abs: 4.7 10*3/uL (ref 1.7–7.7)
Neutrophils Relative %: 61 %
Platelets: 195 10*3/uL (ref 150–400)
RBC: 3.17 MIL/uL — ABNORMAL LOW (ref 4.22–5.81)
RDW: 16.7 % — ABNORMAL HIGH (ref 11.5–15.5)
WBC: 7.7 10*3/uL (ref 4.0–10.5)
nRBC: 0 % (ref 0.0–0.2)

## 2021-08-04 LAB — GLUCOSE, CAPILLARY
Glucose-Capillary: 140 mg/dL — ABNORMAL HIGH (ref 70–99)
Glucose-Capillary: 160 mg/dL — ABNORMAL HIGH (ref 70–99)
Glucose-Capillary: 167 mg/dL — ABNORMAL HIGH (ref 70–99)
Glucose-Capillary: 99 mg/dL (ref 70–99)

## 2021-08-04 LAB — PROTIME-INR
INR: 2 — ABNORMAL HIGH (ref 0.8–1.2)
Prothrombin Time: 23 seconds — ABNORMAL HIGH (ref 11.4–15.2)

## 2021-08-04 MED ORDER — DARBEPOETIN ALFA 40 MCG/0.4ML IJ SOSY
PREFILLED_SYRINGE | INTRAMUSCULAR | Status: AC
  Administered 2021-08-04: 40 ug via INTRAVENOUS
  Filled 2021-08-04: qty 0.4

## 2021-08-04 MED ORDER — HEPARIN SODIUM (PORCINE) 1000 UNIT/ML IJ SOLN
INTRAMUSCULAR | Status: AC
  Administered 2021-08-04: 1000 [IU] via INTRAVENOUS_CENTRAL
  Filled 2021-08-04: qty 6

## 2021-08-04 MED ORDER — DARBEPOETIN ALFA 40 MCG/0.4ML IJ SOSY
40.0000 ug | PREFILLED_SYRINGE | Freq: Once | INTRAMUSCULAR | Status: AC
  Filled 2021-08-04: qty 0.4

## 2021-08-04 MED ORDER — HEPARIN SODIUM (PORCINE) 1000 UNIT/ML DIALYSIS: 1000.0000 [IU] | INTRAMUSCULAR | Status: DC | PRN

## 2021-08-04 MED ORDER — SODIUM CHLORIDE 0.9 % IV SOLN: 100.0000 mL | INTRAVENOUS | Status: DC | PRN

## 2021-08-04 MED ORDER — ALTEPLASE 2 MG IJ SOLR: 2.0000 mg | Freq: Once | INTRAMUSCULAR | Status: DC | PRN

## 2021-08-04 MED ORDER — MIDODRINE HCL 5 MG PO TABS
10.0000 mg | ORAL_TABLET | Freq: Two times a day (BID) | ORAL | Status: DC
  Administered 2021-08-05 – 2021-08-06 (×4): 10 mg via ORAL
  Filled 2021-08-04 (×4): qty 2

## 2021-08-04 NOTE — Procedures (Signed)
   HEMODIALYSIS TREATMENT NOTE:  Uneventful 4 hour session completed.  Catheter entry site is unremarkable.  He was able to use standing scale pre-dialysis and had 1.6 kg of available weight.  Hemodynamically stable.  Tolerated removal of 1.6 liters without interruption in UF.  All blood was returned.  Rockwell Alexandria, RN

## 2021-08-04 NOTE — Progress Notes (Signed)
ANTICOAGULATION CONSULT NOTE - Initial Consult  Pharmacy Consult for Warfarin Indication: atrial fibrillation  Allergies  Allergen Reactions   Lisinopril Other (See Comments)    Other reaction(s): Other (See Comments) Mouth,facial swelling  Mouth,facial swelling  Mouth,facial swelling      Patient Measurements: Height: 5\' 10"  (177.8 cm) Weight: 84.6 kg (186 lb 8.2 oz) IBW/kg (Calculated) : 73  Vital Signs: Temp: 97.9 F (36.6 C) (09/02 0500) Temp Source: Oral (09/02 0500) BP: 127/74 (09/02 0500) Pulse Rate: 68 (09/02 0500)  Labs: Recent Labs    08/02/21 1901 08/02/21 2157 08/03/21 0419 08/03/21 0924 08/04/21 0521  HGB 9.0*  --   --  8.9* 8.7*  HCT 29.7*  --   --  29.0* 29.2*  PLT 199  --   --  180 195  LABPROT 24.0*  --  22.2*  --  23.0*  INR 2.2*  --  2.0*  --  2.0*  CREATININE 3.04*  --   --  3.63* 4.40*  TROPONINIHS 27* 28*  --   --   --      Estimated Creatinine Clearance: 13.6 mL/min (A) (by C-G formula based on SCr of 4.4 mg/dL (H)).   Medical History: Past Medical History:  Diagnosis Date   Acute ischemic vertebrobasilar artery thalamic stroke ([place]) 2015   Atrial fibrillation (HCC)    On Coumadin   Basal cell carcinoma 01/08/2017   right forearm ( txpbx)   COPD (chronic obstructive pulmonary disease) (HCC)    Deceased-donor kidney transplant 2015   Diabetic nephropathy (HCC)    End stage renal disease (HCC)    FSGS (focal segmental glomerulosclerosis)    HLD (hyperlipidemia)    HTN (hypertension)    Long-term use of immunosuppressant medication    SCC (squamous cell carcinoma) 05/10/2015   right side of face (cx90fu)   SCC (squamous cell carcinoma) 04/25/2016   right post scalp ( cx98fu)   SCC (squamous cell carcinoma) 08/27/2017   right post scalp (txpbx) insitu   SCC (squamous cell carcinoma) 05/05/2018   back of scalp (mohs) insitu   SCC (squamous cell carcinoma) 10/21/2018   top of right hand (txpbx) well diff   SCC (squamous cell  carcinoma) 10/21/2018   back of crown (txpbx) insitu   SCC (squamous cell carcinoma) 06/18/2019   back of crown ( cx17fu) insitu   Squamous cell carcinoma of skin 05/10/2015   right crown insitu (cx46fu)   Type 2 diabetes mellitus (HCC)     Medications:  See electronic med rec  Assessment: 80 y.o. M presents with episodes of SOB. Pt on warfarin PTA for afib. Admission INR 2.2 - therapeutic. CBC ok on admission. Home dose: 3mg  alternating with 6mg  - took 3mg  on 8/30  INR 2.0, therapeutic  Goal of Therapy:  INR 2-3 Monitor platelets by anticoagulation protocol: Yes   Plan:  Daily INR Continue warfarin 6mg  alternating with 3mg   Monitor for S/S of bleeding  Isac Sarna, BS Vena Austria, BCPS Clinical Pharmacist Pager [phone] 08/04/2021,8:12 AM

## 2021-08-04 NOTE — Care Management Obs Status (Signed)
[place] NOTIFICATION   Patient Details  Name: Kevin Frey MRN: [id number] Date of Birth: 1941-04-28   Medicare Observation Status Notification Given:  Yes    Tommy Medal 08/04/2021, 10:45 AM

## 2021-08-04 NOTE — Progress Notes (Signed)
PROGRESS NOTE    Kevin Frey  KNL:[id number] DOB: [birth year] DOA: 08/02/2021 PCP: Caryl Bis, MD    Chief Complaint  Patient presents with   Loss of Consciousness    Brief Narrative:  Patient is 80 year old gentleman history of hypertension, A. fib on Coumadin, AS, ESRD on HD, hypertension, peptic ulcer disease, chronic anemia presented to the ED with a syncopal episode after hemodialysis.  Cardiology and nephrology consulted.   Assessment & Plan:   Principal Problem:   Syncope and collapse Active Problems:   Atrial fibrillation, chronic (HCC)   Diabetes mellitus, type II, insulin dependent (HCC)   End stage renal disease on dialysis (HCC)   Hypoalbuminemia   Anemia in ESRD (end-stage renal disease) ([place])   Aortic valve stenosis  #1 syncopal episode/orthostasis -Noted to have occurred post hemodialysis. -Per patient and wife during hemodialysis patient noted to have systolic blood pressures in the 80s despite taking midodrine 20 mg pretreatment. -It is noted that blood pressure remained low at the end of treatment patient had a syncopal episode then and recovered refused EMS.  Patient had another syncopal episode when he returned to his car and transported to the ED. -Blood pressure on presentation with systolics in the 790W. -Patient also with history of moderate to severe AS. -Head CT done negative for any acute abnormalities. -Patient with recent 2D echo 06/2021 with moderate to severe AS. -Carotid Dopplers done this hospitalization with no significant hemodynamic stenosis noted. -Patient seen in consultation by cardiology who reviewed telemetry and patient noted to have some episodes of bradycardia with heart rate in the 50s to 60s and pauses up to 2.8 seconds in A. fib. -Patient noted to be orthostatic on admission and still orthostatic on repeat orthostasis today however with some improvement.  Patient with no further syncopal episodes. -Coreg held on admission and  resumed at a lower dose of 3.125 mg twice daily per cardiology recommendations with consideration for outpatient monitor. -TED hose placed. -We will place patient on midodrine 10 mg twice daily daily. -Supportive care. -Cardiology and nephrology following.  2.  End-stage renal disease on HD (MWF [facility] ) -Patient end-stage renal disease status post DD KT in 2015 with recent graft failure and reinstitution of dialysis June 09, 2021. -Patient presented with syncopal episode post HD and noted during HD to have systolic blood pressures in the 80s despite midodrine 20 mg daily. -Nephrology consulted and patient scheduled for hemodialysis today with pretreatment with midodrine.  -Continue current dose of Myfortic. -Appreciate nephrology input and recommendations.  3.  Permanent atrial fibrillation -Noted to be on Coreg 12.5 mg twice daily for rate control prior to admission and Coumadin for anticoagulation. -Coreg held on admission and resumed at a lower dose of 3.125 mg twice daily per cardiology recommendations. -Patient seen by cardiology who are noting some episodes of bradycardia on telemetry with heart rates in the 50s to 60s and pauses up to 2.8 seconds while in A. fib. -INR at 2.0  -Continue Coumadin for anticoagulation.   -Per cardiology.    4.  Abnormal echo -Per cardiology patient with recent TEE 06/2021 showing a large mobile echodensity in the RV and large mobile echodensity on aortic which could represent atheroma or thrombus.  It is noted that cardiac MRI during that admission did not show any abnormalities. -Continue Coumadin. -Per cardiology.  5.  Aortic stenosis -Patient noted to have severe aortic stenosis per 2D echo 06/2021 with aortic stenosis read as moderate by TEE during that admission. -  Per cardiology on review of notes from Dr. Candis Musa conservative management was preferred at this time. -Outpatient follow-up with cardiology.  6.  Anemia of chronic disease -Hemoglobin  stable at 8.7.   -Per nephrology.   7.  Diabetes mellitus type 2, insulin-dependent -Hemoglobin A1c 4.9 (07/18/2021) -CBG 140 this morning.   -Continue Semglee, SSI.   8.  Recent diagnosis of sinusitis -Continue Keflex.  9.  BPH with chronic indwelling Foley catheter -Proscar. -Outpatient follow-up with urology for cystoscopy, voiding trial and cystoscopy urethrogram.  10.  History of peptic ulcer disease -PPI twice daily.  11.  Dysphagia -Continue dysphagia 3 diet with honey thick liquids.   -Patient seen by SLP.  12.  Bacteriuria -Urinalysis with large leukocytes, nitrite negative, few bacteria, > 50 WBCs.  Urine cultures were not obtained on admission.  Patient afebrile.  Normal white count.  Patient on Keflex.  Patient recently treated for UTI during prior hospitalization    DVT prophylaxis: Coumadin Code Status: Full Family Communication: Updated patient and wife at bedside.  Disposition:   Status is: Observation  The patient remains OBS appropriate and will d/c before 2 midnights.  Dispo: The patient is from: Home              Anticipated d/c is to: Home              Patient currently is not medically stable to d/c.   Difficult to place patient No       Consultants:  Cardiology: Dr. Domenic Polite 08/03/2021 Nephrology: Dr. Johnney Ou 08/03/2021  Procedures:  CT head 08/02/2021 Chest x-ray 08/02/2021 Carotid Dopplers 08/03/2021     Antimicrobials:  Keflex 08/03/2021>>>>   Subjective: Laying in bed.  Denies any chest pain.  No shortness of breath.  No abdominal pain.  Tolerating current diet.  Awaiting for hemodialysis today.  Wife at bedside.     Objective: Vitals:   08/03/21 1700 08/03/21 2018 08/04/21 0500 08/04/21 0816  BP: 131/84 123/77 127/74 131/86  Pulse: (!) 56 63 68 (!) 59  Resp: 18 18 20 18   Temp: 97.9 F (36.6 C) 98.3 F (36.8 C) 97.9 F (36.6 C) 97.8 F (36.6 C)  TempSrc: Oral Oral Oral Oral  SpO2: 93% 97% 100% 90%  Weight:   84.6 kg    Height:        Intake/Output Summary (Last 24 hours) at 08/04/2021 1338 Last data filed at 08/03/2021 1745 Gross per 24 hour  Intake 200 ml  Output --  Net 200 ml    Filed Weights   08/02/21 1821 08/04/21 0500  Weight: 80 kg 84.6 kg    Examination:  General exam: NAD Respiratory system: Lungs clear to auscultation bilaterally.  No wheezes, no crackles, no rhonchi.  Normal respiratory effort.  Cardiovascular system: Irregularly irregular.  3/6 systolic ejection murmur.  No JVD.  Trace bilateral lower extremity edema. Gastrointestinal system: Abdomen is soft, nontender, nondistended, positive bowel sounds.  No rebound.  No guarding.   Central nervous system: Alert and oriented. No focal neurological deficits. Extremities: Trace bilateral lower extremity edema.  Symmetric 5 x 5 power. Skin: No rashes, lesions or ulcers Psychiatry: Judgement and insight appear normal. Mood & affect appropriate.     Data Reviewed: I have personally reviewed following labs and imaging studies  CBC: Recent Labs  Lab 08/02/21 1901 08/03/21 0924 08/04/21 0521  WBC 9.6 7.1 7.7  NEUTROABS 8.1* 4.9 4.7  HGB 9.0* 8.9* 8.7*  HCT 29.7* 29.0* 29.2*  MCV 91.7 90.6  92.1  PLT 199 180 195     Basic Metabolic Panel: Recent Labs  Lab 08/02/21 1901 08/03/21 0924 08/04/21 0521  NA 133* 133* 135  K 4.4 4.3 4.8  CL 96* 98 94*  CO2 28 28 28   GLUCOSE 228* 144* 162*  BUN 35* 42* 53*  CREATININE 3.04* 3.63* 4.40*  CALCIUM 7.9* 8.2* 8.5*  PHOS  --  5.4* 6.2*     GFR: Estimated Creatinine Clearance: 13.6 mL/min (A) (by C-G formula based on SCr of 4.4 mg/dL (H)).  Liver Function Tests: Recent Labs  Lab 08/02/21 1901 08/03/21 0924 08/04/21 0521  AST 30  --   --   ALT 24  --   --   ALKPHOS 74  --   --   BILITOT 1.3*  --   --   PROT 6.1*  --   --   ALBUMIN 3.0* 2.9* 2.8*     CBG: Recent Labs  Lab 08/03/21 1206 08/03/21 1628 08/03/21 2022 08/04/21 0716 08/04/21 1109  GLUCAP 126*  199* 208* 140* 160*      Recent Results (from the past 240 hour(s))  Resp Panel by RT-PCR (Flu A&B, Covid) Nasopharyngeal Swab     Status: None   Collection Time: 08/02/21 11:06 PM   Specimen: Nasopharyngeal Swab; Nasopharyngeal(NP) swabs in vial transport medium  Result Value Ref Range Status   SARS Coronavirus 2 by RT PCR NEGATIVE NEGATIVE Final    Comment: (NOTE) SARS-CoV-2 target nucleic acids are NOT DETECTED.  The SARS-CoV-2 RNA is generally detectable in upper respiratory specimens during the acute phase of infection. The lowest concentration of SARS-CoV-2 viral copies this assay can detect is 138 copies/mL. A negative result does not preclude SARS-Cov-2 infection and should not be used as the sole basis for treatment or other patient management decisions. A negative result may occur with  improper specimen collection/handling, submission of specimen other than nasopharyngeal swab, presence of viral mutation(s) within the areas targeted by this assay, and inadequate number of viral copies(<138 copies/mL). A negative result must be combined with clinical observations, patient history, and epidemiological information. The expected result is Negative.  Fact Sheet for Patients:  EntrepreneurPulse.com.au  Fact Sheet for Healthcare Providers:  IncredibleEmployment.be  This test is no t yet approved or cleared by the [place] FDA and  has been authorized for detection and/or diagnosis of SARS-CoV-2 by FDA under an Emergency Use Authorization (EUA). This EUA will remain  in effect (meaning this test can be used) for the duration of the COVID-19 declaration under Section 564(b)(1) of the Act, 21 U.S.C.section 360bbb-3(b)(1), unless the authorization is terminated  or revoked sooner.       Influenza A by PCR NEGATIVE NEGATIVE Final   Influenza B by PCR NEGATIVE NEGATIVE Final    Comment: (NOTE) The Xpert Xpress SARS-CoV-2/FLU/RSV plus  assay is intended as an aid in the diagnosis of influenza from Nasopharyngeal swab specimens and should not be used as a sole basis for treatment. Nasal washings and aspirates are unacceptable for Xpert Xpress SARS-CoV-2/FLU/RSV testing.  Fact Sheet for Patients: EntrepreneurPulse.com.au  Fact Sheet for Healthcare Providers: IncredibleEmployment.be  This test is not yet approved or cleared by the [place] FDA and has been authorized for detection and/or diagnosis of SARS-CoV-2 by FDA under an Emergency Use Authorization (EUA). This EUA will remain in effect (meaning this test can be used) for the duration of the COVID-19 declaration under Section 564(b)(1) of the Act, 21 U.S.C. section 360bbb-3(b)(1), unless the authorization  is terminated or revoked.  Performed at [facility], [address], [place], [place] [address]           Radiology Studies: CT HEAD WO CONTRAST (5MM)  Result Date: 08/02/2021 CLINICAL DATA:  Head trauma. EXAM: CT HEAD WITHOUT CONTRAST TECHNIQUE: Contiguous axial images were obtained from the base of the skull through the vertex without intravenous contrast. COMPARISON:  Head CT dated 07/31/2021. FINDINGS: Brain: There is moderate age-related atrophy and chronic microvascular ischemic changes. Bilateral basal ganglia calcifications noted. There is no acute intracranial hemorrhage. No mass effect or midline shift. No extra-axial fluid collection. Vascular: No hyperdense vessel or unexpected calcification. Skull: Normal. Negative for fracture or focal lesion. Sinuses/Orbits: Complete opacification of the visualized maxillary and sphenoid sinuses. No air-fluid level. The mastoid air cells are clear. Other: None IMPRESSION: 1. No acute intracranial pathology. 2. Moderate age-related atrophy and chronic microvascular ischemic changes. 3. Paranasal sinus disease. Electronically Signed   By: Anner Crete M.D.   On: 08/02/2021  19:43   US Carotid Bilateral  Result Date: 08/03/2021 CLINICAL DATA:  Syncope, fall EXAM: BILATERAL CAROTID DUPLEX ULTRASOUND TECHNIQUE: Pearline Cables scale imaging, color Doppler and duplex ultrasound were performed of bilateral carotid and vertebral arteries in the neck. COMPARISON:  None. FINDINGS: Criteria: Quantification of carotid stenosis is based on velocity parameters that correlate the residual internal carotid diameter with NASCET-based stenosis levels, using the diameter of the distal internal carotid lumen as the denominator for stenosis measurement. The following velocity measurements were obtained: RIGHT ICA: 71/21 cm/sec CCA: 19/14 cm/sec SYSTOLIC ICA/CCA RATIO:  1.2 ECA: 40 cm/sec LEFT ICA: 77/24 cm/sec CCA: 78/29 cm/sec SYSTOLIC ICA/CCA RATIO:  2.1 ECA: 43 cm/sec RIGHT CAROTID ARTERY: Minimal atherosclerotic plaque in the carotid bulb. No significant stenosis. RIGHT VERTEBRAL ARTERY:  Antegrade flow LEFT CAROTID ARTERY: Minimal atherosclerotic plaque in the carotid bulb. No significant stenosis. LEFT VERTEBRAL ARTERY:  Antegrade flow IMPRESSION: No evidence of hemodynamically significant stenosis involving either the right or left carotid circulation in the neck by Doppler criteria. Minimal atherosclerotic plaque in the bilateral carotid bulbs results in no significant stenosis. Electronically Signed   By: Albin Felling M.D.   On: 08/03/2021 13:23   DG Chest Portable 1 View  Result Date: 08/02/2021 CLINICAL DATA:  Shortness of breath. EXAM: PORTABLE CHEST 1 VIEW COMPARISON:  July 31, 2021 FINDINGS: There is stable right internal jugular venous catheter positioning. Low lung volumes are seen with chronic appearing increased interstitial lung markings. Mild to moderate severity areas of atelectasis and/or infiltrate are again seen within the bilateral lung bases. There is a small right pleural effusion. No pneumothorax is identified. The cardiac silhouette is enlarged and unchanged in size. The  visualized skeletal structures are unremarkable. IMPRESSION: 1. Stable cardiomegaly and vascular congestion mild to moderate severity bibasilar atelectasis and/or infiltrate. 2. Small right pleural effusion. Electronically Signed   By: Virgina Norfolk M.D.   On: 08/02/2021 19:52        Scheduled Meds:  carvedilol  3.125 mg Oral BID WC   cephALEXin  500 mg Oral Daily   Chlorhexidine Gluconate Cloth  6 each Topical Q0600   darbepoetin (ARANESP) injection - DIALYSIS  40 mcg Intravenous Once   finasteride  5 mg Oral Daily   insulin aspart  0-6 Units Subcutaneous TID WC   insulin glargine-yfgn  6 Units Subcutaneous QHS   midodrine  10 mg Oral 2 times per day on Mon Wed Fri   mycophenolate  360 mg Oral BID  pantoprazole  40 mg Oral BID   sodium chloride flush  3 mL Intravenous Q12H   warfarin  3 mg Oral Q48H   warfarin  6 mg Oral Q48H   Warfarin - Pharmacist Dosing Inpatient   Does not apply q1600   Continuous Infusions:   LOS: 0 days    Time spent: 35 minutes    Irine Seal, MD [facility]   To contact the attending provider between 7A-7P or the covering provider during after hours 7P-7A, please log into the web site [email or web address] and access using universal [place] password for that web site. If you do not have the password, please call the hospital operator.  08/04/2021, 1:38 PM

## 2021-08-04 NOTE — Progress Notes (Signed)
[facility] night shift telemetry coverage note.  The nursing staff reported that the patient wanted to use his CPAP device from home and requested an order for it.  CPAP nightly order placed.  Tennis Must, MD.

## 2021-08-04 NOTE — Progress Notes (Signed)
[facility] Progress Note   Subjective:   Feeling ok this AM; no new complaints.   Objective Vitals:   08/03/21 1700 08/03/21 2018 08/04/21 0500 08/04/21 0816  BP: 131/84 123/77 127/74 131/86  Pulse: (!) 56 63 68 (!) 59  Resp: 18 18 20 18   Temp: 97.9 F (36.6 C) 98.3 F (36.8 C) 97.9 F (36.6 C) 97.8 F (36.6 C)  TempSrc: Oral Oral Oral Oral  SpO2: 93% 97% 100% 90%  Weight:   84.6 kg   Height:       Physical Exam General: elderly man lying quietly in bed Heart: RRR, III/VI SEM Lungs: clear lateral bases Abdomen: soft Extremities: 1+ pitting to knees and dependent in thighs Dialysis Access: RIJ [facility] c/d/I GU: foley draining sl cloudy urine  Additional Objective Labs: Basic Metabolic Panel: Recent Labs  Lab 08/02/21 1901 08/03/21 0924 08/04/21 0521  NA 133* 133* 135  K 4.4 4.3 4.8  CL 96* 98 94*  CO2 28 28 28   GLUCOSE 228* 144* 162*  BUN 35* 42* 53*  CREATININE 3.04* 3.63* 4.40*  CALCIUM 7.9* 8.2* 8.5*  PHOS  --  5.4* 6.2*   Liver Function Tests: Recent Labs  Lab 08/02/21 1901 08/03/21 0924 08/04/21 0521  AST 30  --   --   ALT 24  --   --   ALKPHOS 74  --   --   BILITOT 1.3*  --   --   PROT 6.1*  --   --   ALBUMIN 3.0* 2.9* 2.8*   No results for input(s): LIPASE, AMYLASE in the last 168 hours. CBC: Recent Labs  Lab 08/02/21 1901 08/03/21 0924 08/04/21 0521  WBC 9.6 7.1 7.7  NEUTROABS 8.1* 4.9 4.7  HGB 9.0* 8.9* 8.7*  HCT 29.7* 29.0* 29.2*  MCV 91.7 90.6 92.1  PLT 199 180 195   Blood Culture    Component Value Date/Time   SDES  07/18/2021 0147    IN/OUT CATH URINE Performed at [facility], [address] [place], [place] [address]    [facility]  07/18/2021 0147    NONE Performed at [facility], [address] [place], [place] [address]    CULT MULTIPLE SPECIES PRESENT, SUGGEST RECOLLECTION (A) 07/18/2021 0147   REPTSTATUS 07/19/2021 FINAL 07/18/2021 0147    Cardiac Enzymes: No results for input(s): CKTOTAL,  CKMB, CKMBINDEX, TROPONINI in the last 168 hours. CBG: Recent Labs  Lab 08/03/21 1206 08/03/21 1628 08/03/21 2022 08/04/21 0716  GLUCAP 126* 199* 208* 140*   Iron Studies: No results for input(s): IRON, TIBC, TRANSFERRIN, FERRITIN in the last 72 hours. @lablastinr3 @ Studies/Results: CT HEAD WO CONTRAST (5MM)  Result Date: 08/02/2021 CLINICAL DATA:  Head trauma. EXAM: CT HEAD WITHOUT CONTRAST TECHNIQUE: Contiguous axial images were obtained from the base of the skull through the vertex without intravenous contrast. COMPARISON:  Head CT dated 07/31/2021. FINDINGS: Brain: There is moderate age-related atrophy and chronic microvascular ischemic changes. Bilateral basal ganglia calcifications noted. There is no acute intracranial hemorrhage. No mass effect or midline shift. No extra-axial fluid collection. Vascular: No hyperdense vessel or unexpected calcification. Skull: Normal. Negative for fracture or focal lesion. Sinuses/Orbits: Complete opacification of the visualized maxillary and sphenoid sinuses. No air-fluid level. The mastoid air cells are clear. Other: None IMPRESSION: 1. No acute intracranial pathology. 2. Moderate age-related atrophy and chronic microvascular ischemic changes. 3. Paranasal sinus disease. Electronically Signed   By: Anner Crete M.D.   On: 08/02/2021 19:43   US Carotid Bilateral  Result Date: 08/03/2021 CLINICAL DATA:  Syncope, fall EXAM: BILATERAL CAROTID DUPLEX ULTRASOUND TECHNIQUE: Pearline Cables scale imaging, color Doppler and duplex ultrasound were performed of bilateral carotid and vertebral arteries in the neck. COMPARISON:  None. FINDINGS: Criteria: Quantification of carotid stenosis is based on velocity parameters that correlate the residual internal carotid diameter with NASCET-based stenosis levels, using the diameter of the distal internal carotid lumen as the denominator for stenosis measurement. The following velocity measurements were obtained: RIGHT ICA: 71/21  cm/sec CCA: 47/42 cm/sec SYSTOLIC ICA/CCA RATIO:  1.2 ECA: 40 cm/sec LEFT ICA: 77/24 cm/sec CCA: 59/56 cm/sec SYSTOLIC ICA/CCA RATIO:  2.1 ECA: 43 cm/sec RIGHT CAROTID ARTERY: Minimal atherosclerotic plaque in the carotid bulb. No significant stenosis. RIGHT VERTEBRAL ARTERY:  Antegrade flow LEFT CAROTID ARTERY: Minimal atherosclerotic plaque in the carotid bulb. No significant stenosis. LEFT VERTEBRAL ARTERY:  Antegrade flow IMPRESSION: No evidence of hemodynamically significant stenosis involving either the right or left carotid circulation in the neck by Doppler criteria. Minimal atherosclerotic plaque in the bilateral carotid bulbs results in no significant stenosis. Electronically Signed   By: Albin Felling M.D.   On: 08/03/2021 13:23   DG Chest Portable 1 View  Result Date: 08/02/2021 CLINICAL DATA:  Shortness of breath. EXAM: PORTABLE CHEST 1 VIEW COMPARISON:  July 31, 2021 FINDINGS: There is stable right internal jugular venous catheter positioning. Low lung volumes are seen with chronic appearing increased interstitial lung markings. Mild to moderate severity areas of atelectasis and/or infiltrate are again seen within the bilateral lung bases. There is a small right pleural effusion. No pneumothorax is identified. The cardiac silhouette is enlarged and unchanged in size. The visualized skeletal structures are unremarkable. IMPRESSION: 1. Stable cardiomegaly and vascular congestion mild to moderate severity bibasilar atelectasis and/or infiltrate. 2. Small right pleural effusion. Electronically Signed   By: Virgina Norfolk M.D.   On: 08/02/2021 19:52   Medications:   carvedilol  3.125 mg Oral BID WC   cephALEXin  500 mg Oral Daily   Chlorhexidine Gluconate Cloth  6 each Topical Q0600   finasteride  5 mg Oral Daily   insulin aspart  0-6 Units Subcutaneous TID WC   insulin glargine-yfgn  6 Units Subcutaneous QHS   midodrine  10 mg Oral 2 times per day on Mon Wed Fri   mycophenolate  360 mg  Oral BID   pantoprazole  40 mg Oral BID   sodium chloride flush  3 mL Intravenous Q12H   warfarin  3 mg Oral Q48H   warfarin  6 mg Oral Q48H   Warfarin - Pharmacist Dosing Inpatient   Does not apply q1600    Dialysis Orders: Rev 300, 4 hr, EDW 81.5kg, 2/2.5, no heparin, epo 6000 qtx and venofer 100 qtx.  Assessment/Plan **Syncope:  Temporally related to HD X2 despite really no UF. Cardiology is following - appreciate input.  Coreg dose has been reduced and BPs look good this AM.  Midodrine remains 10 BID on MWF (HD days) - increase PRN. Please place compression stockings if possible too.     **ESRD on HD:  MWF outpt since 06/2021.  HD today with maneuvers to support BP -- pretx midodrine, low temp, UF profiling.  Follow I/Os and consider adding a diuretic to limit need for UF with HD while maintaining euvolemia -- UOP not documented though I/Os ordered.   UF to EDW but if unable to stand to weight try 1.5-2L UF.    **Anemia:  Hb 8.7 - outpt receiving IV iron  load and epogen 6000qtx; will cont IV iron here and sub aranesp.    **Secondary hyperparathyroidism:  Phos 5.4, doesn't require binders.  PTH followed outpt   **BPH, in dwelling foley: UA with WBC on admission; culture is pending.    **s/p renal transplant: back on HD since 06/2021 - still on low dose myfortic, cont for now.  Jannifer Hick MD 08/04/2021, 8:59 AM  [facility] Pager: [phone]

## 2021-08-04 NOTE — Progress Notes (Addendum)
Progress Note  Patient Name: Kevin Frey Date of Encounter: 08/04/2021  [facility] Cardiologist: Dr. Candis [facility]  Subjective   No complaints this AM. Breathing back to baseline. No chest pain or palpitations.   Inpatient Medications    Scheduled Meds:  carvedilol  3.125 mg Oral BID WC   cephALEXin  500 mg Oral Daily   Chlorhexidine Gluconate Cloth  6 each Topical Q0600   darbepoetin (ARANESP) injection - DIALYSIS  40 mcg Intravenous Once   finasteride  5 mg Oral Daily   insulin aspart  0-6 Units Subcutaneous TID WC   insulin glargine-yfgn  6 Units Subcutaneous QHS   midodrine  10 mg Oral 2 times per day on Mon Wed Fri   mycophenolate  360 mg Oral BID   pantoprazole  40 mg Oral BID   sodium chloride flush  3 mL Intravenous Q12H   warfarin  3 mg Oral Q48H   warfarin  6 mg Oral Q48H   Warfarin - Pharmacist Dosing Inpatient   Does not apply q1600    PRN Meds: acetaminophen **OR** acetaminophen, ipratropium-albuterol   Vital Signs    Vitals:   08/03/21 1700 08/03/21 2018 08/04/21 0500 08/04/21 0816  BP: 131/84 123/77 127/74 131/86  Pulse: (!) 56 63 68 (!) 59  Resp: 18 18 20 18   Temp: 97.9 F (36.6 C) 98.3 F (36.8 C) 97.9 F (36.6 C) 97.8 F (36.6 C)  TempSrc: Oral Oral Oral Oral  SpO2: 93% 97% 100% 90%  Weight:   84.6 kg   Height:        Intake/Output Summary (Last 24 hours) at 08/04/2021 1012 Last data filed at 08/03/2021 1745 Gross per 24 hour  Intake 400 ml  Output --  Net 400 ml   Last 3 Weights 08/04/2021 08/02/2021 07/21/2021  Weight (lbs) 186 lb 8.2 oz 176 lb 5.9 oz 176 lb 5.9 oz  Weight (kg) 84.6 kg 80 kg 80 kg      Telemetry    Atrial fibrillation, HR in 50's to 70's. Pauses up to 2.4 seconds. - Personally Reviewed  ECG    No new tracings.   Physical Exam   GEN: Pleasant elderly male appearing in no acute distress.   Neck: No JVD Cardiac: Irregularly irregular, 2/6 SEM along RUSB.  Respiratory: Clear to auscultation bilaterally. GI:  Soft, nontender, non-distended  MS: Trace lower extremity edema; No deformity. Neuro:  Nonfocal  Psych: Normal affect   Labs    High Sensitivity Troponin:   Recent Labs  Lab 08/02/21 1901 08/02/21 2157  TROPONINIHS 27* 28*      Chemistry Recent Labs  Lab 08/02/21 1901 08/03/21 0924 08/04/21 0521  NA 133* 133* 135  K 4.4 4.3 4.8  CL 96* 98 94*  CO2 28 28 28   GLUCOSE 228* 144* 162*  BUN 35* 42* 53*  CREATININE 3.04* 3.63* 4.40*  CALCIUM 7.9* 8.2* 8.5*  PROT 6.1*  --   --   ALBUMIN 3.0* 2.9* 2.8*  AST 30  --   --   ALT 24  --   --   ALKPHOS 74  --   --   BILITOT 1.3*  --   --   GFRNONAA 20* 16* 13*  ANIONGAP 9 7 13      Hematology Recent Labs  Lab 08/02/21 1901 08/03/21 0924 08/04/21 0521  WBC 9.6 7.1 7.7  RBC 3.24* 3.20* 3.17*  HGB 9.0* 8.9* 8.7*  HCT 29.7* 29.0* 29.2*  MCV 91.7 90.6 92.1  MCH 27.8 27.8 27.4  MCHC 30.3 30.7 29.8*  RDW 16.9* 17.0* 16.7*  PLT 199 180 195    Radiology    CT HEAD WO CONTRAST (5MM)  Result Date: 08/02/2021 CLINICAL DATA:  Head trauma. EXAM: CT HEAD WITHOUT CONTRAST TECHNIQUE: Contiguous axial images were obtained from the base of the skull through the vertex without intravenous contrast. COMPARISON:  Head CT dated 07/31/2021. FINDINGS: Brain: There is moderate age-related atrophy and chronic microvascular ischemic changes. Bilateral basal ganglia calcifications noted. There is no acute intracranial hemorrhage. No mass effect or midline shift. No extra-axial fluid collection. Vascular: No hyperdense vessel or unexpected calcification. Skull: Normal. Negative for fracture or focal lesion. Sinuses/Orbits: Complete opacification of the visualized maxillary and sphenoid sinuses. No air-fluid level. The mastoid air cells are clear. Other: None IMPRESSION: 1. No acute intracranial pathology. 2. Moderate age-related atrophy and chronic microvascular ischemic changes. 3. Paranasal sinus disease. Electronically Signed   By: Anner Crete  M.D.   On: 08/02/2021 19:43   US Carotid Bilateral  Result Date: 08/03/2021 CLINICAL DATA:  Syncope, fall EXAM: BILATERAL CAROTID DUPLEX ULTRASOUND TECHNIQUE: Pearline Cables scale imaging, color Doppler and duplex ultrasound were performed of bilateral carotid and vertebral arteries in the neck. COMPARISON:  None. FINDINGS: Criteria: Quantification of carotid stenosis is based on velocity parameters that correlate the residual internal carotid diameter with NASCET-based stenosis levels, using the diameter of the distal internal carotid lumen as the denominator for stenosis measurement. The following velocity measurements were obtained: RIGHT ICA: 71/21 cm/sec CCA: 92/11 cm/sec SYSTOLIC ICA/CCA RATIO:  1.2 ECA: 40 cm/sec LEFT ICA: 77/24 cm/sec CCA: 94/17 cm/sec SYSTOLIC ICA/CCA RATIO:  2.1 ECA: 43 cm/sec RIGHT CAROTID ARTERY: Minimal atherosclerotic plaque in the carotid bulb. No significant stenosis. RIGHT VERTEBRAL ARTERY:  Antegrade flow LEFT CAROTID ARTERY: Minimal atherosclerotic plaque in the carotid bulb. No significant stenosis. LEFT VERTEBRAL ARTERY:  Antegrade flow IMPRESSION: No evidence of hemodynamically significant stenosis involving either the right or left carotid circulation in the neck by Doppler criteria. Minimal atherosclerotic plaque in the bilateral carotid bulbs results in no significant stenosis. Electronically Signed   By: Albin Felling M.D.   On: 08/03/2021 13:23   DG Chest Portable 1 View  Result Date: 08/02/2021 CLINICAL DATA:  Shortness of breath. EXAM: PORTABLE CHEST 1 VIEW COMPARISON:  July 31, 2021 FINDINGS: There is stable right internal jugular venous catheter positioning. Low lung volumes are seen with chronic appearing increased interstitial lung markings. Mild to moderate severity areas of atelectasis and/or infiltrate are again seen within the bilateral lung bases. There is a small right pleural effusion. No pneumothorax is identified. The cardiac silhouette is enlarged and  unchanged in size. The visualized skeletal structures are unremarkable. IMPRESSION: 1. Stable cardiomegaly and vascular congestion mild to moderate severity bibasilar atelectasis and/or infiltrate. 2. Small right pleural effusion. Electronically Signed   By: Virgina Norfolk M.D.   On: 08/02/2021 19:52    Cardiac Studies   TEE: 06/2021 SUMMARY  Cannot fully assess LV function with images obtained.  The right ventricle is normal size.  The right ventricular systolic function is normal.  The left atrial size is normal.  No thrombus is detected in the left atrial appendage.  Diffuse thickening of the aortic valve with restricted cusp opening.  Planimetered valve area in 2D echo is 1.1 cm2  There is mild aortic regurgitation.  There is moderate aortic stenosis.  There is mild mitral regurgitation.  Structurally normal tricuspid valve.  No tricuspid regurgitation.  There is no pericardial effusion.  Mobile echodensity in the RV attached to the TV chordae, measuring  1.6cm x 0.89cm, best seen in clip 19. Clinical correlation advised.  Consider CMR for further characterization if clinically indicated.  There is a large mobile echodensity on the aortic arch, measured  2.3cmx1.1cm, seen in clip 49. Differential include atheroma and  thrombus.     cMRI: 06/2021 Conclusions:                                                                                                                                                        RV small mass is not seen on the current study, there is only mildly                        prominent trabceculation  that corresponds with location of unspecified                    mass on TTE. No hypervascularity or late gadolinium enhancement of this                    area was seen.      Patient Profile     80 y.o. male w/ PMH of permanent atrial fibrillation (on Coumadin), ESRD (on HD - MWF schedule), chronic hypoxic respiratory failure (on 3L [place] at baseline),  aortic stenosis, HTN, IDDM, PUD and chronic anemia who is currently admitted for evaluation of recurrent syncopal episodes.   Assessment & Plan    1. Syncopal Episodes - Presented with syncopal episodes which occurred following HD and he denied any prodromal symptoms. Orthostatics were checked yesterday and BP dropped from 120/64 to 99/65 when going from sitting to standing. He is on Midodrine 10mg  BID on HD days but may benefit from more routine dosing given his hypotension and syncopal episodes. He was bradycardiac on admission with HR in the 50's to 60's and was having pauses up to 2.8 seconds with Coreg initially held. This has been restarted at 3.125mg  BID and tolerating well at this time. No plans for further cardiac testing this admission.   2. Permanent Atrial Fibrillation - Was on Coreg 12.5mg  BID prior to admission and reduced to 3.125 mg twice daily yesterday in the setting of hypotension and bradycardia. Telemetry shows rates overall remain well-controlled.  - He is on Coumadin for anticoagulation with INR at 2.0 today and Hgb at 8.7.   3. Aortic Stenosis - Prior TTE showed moderate to severe AS and was moderate by TEE in 06/2021. He has preferred conservative management of this by review of notes from his primary cardiologist. We reviewed that a repeat echocardiogram should be obtained in 6 months for reassessment.    4. Abnormal Echo - Prior TEE at [facility] in 06/2021  showed a large mobile echodensity in the RV and a large mobile echodensity on the aortic arch which could represent atheroma or thrombus. No abnormalities were noted on follow-up Cardiac MRI. He does remains on Coumadin.    5. ESRD - He is on HD and Nephrology is following with plans for HD later today.     For questions or updates, please contact [place] Please consult [email or web address] for contact info under        Signed, Erma Heritage, PA-C  08/04/2021, 10:12 AM      Attending note:  Patient  seen and examined.  Discussed with Ms. Delano Metz, I agree with her above findings.  Cardiac monitor shows rate controlled atrial fibrillation with some bradycardia, but not obviously symptomatic and no pauses greater than 2 seconds under observation.  Coreg was cut back to 3.125 mg twice daily.  On examination he appears comfortable at rest.  He is afebrile, heart rate in the 60s in atrial fibrillation, blood pressure 131/86. Not significantly orthostatic on examination.  Lungs are clear.  Cardiac exam with irregularly irregular rhythm and 3/6 systolic murmur at the base.  Pertinent lab work includes potassium 4.8, BUN 53, creatinine 4.4, hemoglobin 8.7, platelets 195, INR 2.0.  He anticipates regular hemodialysis session today, follow vital signs and blood pressure trend.  Midodrine was recently increased to 10 mg twice daily on hemodialysis days, may need to use this at higher dose or more regularly depending on his blood pressure with time.  He does have moderate to severe aortic stenosis which can be followed by his primary cardiologist as an outpatient.  Satira Sark, M.D., F.A.C.C.

## 2021-08-05 DIAGNOSIS — N400 Enlarged prostate without lower urinary tract symptoms: Secondary | ICD-10-CM | POA: Diagnosis present

## 2021-08-05 DIAGNOSIS — J449 Chronic obstructive pulmonary disease, unspecified: Secondary | ICD-10-CM | POA: Diagnosis present

## 2021-08-05 DIAGNOSIS — E1122 Type 2 diabetes mellitus with diabetic chronic kidney disease: Secondary | ICD-10-CM | POA: Diagnosis present

## 2021-08-05 DIAGNOSIS — Z992 Dependence on renal dialysis: Secondary | ICD-10-CM | POA: Diagnosis not present

## 2021-08-05 DIAGNOSIS — N186 End stage renal disease: Secondary | ICD-10-CM | POA: Diagnosis present

## 2021-08-05 DIAGNOSIS — I951 Orthostatic hypotension: Secondary | ICD-10-CM | POA: Diagnosis present

## 2021-08-05 DIAGNOSIS — I482 Chronic atrial fibrillation, unspecified: Secondary | ICD-10-CM | POA: Diagnosis not present

## 2021-08-05 DIAGNOSIS — R1310 Dysphagia, unspecified: Secondary | ICD-10-CM | POA: Diagnosis present

## 2021-08-05 DIAGNOSIS — J9611 Chronic respiratory failure with hypoxia: Secondary | ICD-10-CM | POA: Diagnosis present

## 2021-08-05 DIAGNOSIS — I120 Hypertensive chronic kidney disease with stage 5 chronic kidney disease or end stage renal disease: Secondary | ICD-10-CM | POA: Diagnosis present

## 2021-08-05 DIAGNOSIS — Z20822 Contact with and (suspected) exposure to covid-19: Secondary | ICD-10-CM | POA: Diagnosis present

## 2021-08-05 DIAGNOSIS — D631 Anemia in chronic kidney disease: Secondary | ICD-10-CM | POA: Diagnosis present

## 2021-08-05 DIAGNOSIS — Z8673 Personal history of transient ischemic attack (TIA), and cerebral infarction without residual deficits: Secondary | ICD-10-CM | POA: Diagnosis not present

## 2021-08-05 DIAGNOSIS — D84821 Immunodeficiency due to drugs: Secondary | ICD-10-CM | POA: Diagnosis present

## 2021-08-05 DIAGNOSIS — R8271 Bacteriuria: Secondary | ICD-10-CM | POA: Diagnosis present

## 2021-08-05 DIAGNOSIS — Z87891 Personal history of nicotine dependence: Secondary | ICD-10-CM | POA: Diagnosis not present

## 2021-08-05 DIAGNOSIS — E785 Hyperlipidemia, unspecified: Secondary | ICD-10-CM | POA: Diagnosis present

## 2021-08-05 DIAGNOSIS — Z8711 Personal history of peptic ulcer disease: Secondary | ICD-10-CM | POA: Diagnosis not present

## 2021-08-05 DIAGNOSIS — Z794 Long term (current) use of insulin: Secondary | ICD-10-CM | POA: Diagnosis not present

## 2021-08-05 DIAGNOSIS — N2581 Secondary hyperparathyroidism of renal origin: Secondary | ICD-10-CM | POA: Diagnosis present

## 2021-08-05 DIAGNOSIS — Z85828 Personal history of other malignant neoplasm of skin: Secondary | ICD-10-CM | POA: Diagnosis not present

## 2021-08-05 DIAGNOSIS — I4821 Permanent atrial fibrillation: Secondary | ICD-10-CM | POA: Diagnosis present

## 2021-08-05 DIAGNOSIS — Z940 Kidney transplant status: Secondary | ICD-10-CM | POA: Diagnosis not present

## 2021-08-05 DIAGNOSIS — I350 Nonrheumatic aortic (valve) stenosis: Secondary | ICD-10-CM | POA: Diagnosis present

## 2021-08-05 DIAGNOSIS — R55 Syncope and collapse: Secondary | ICD-10-CM | POA: Diagnosis not present

## 2021-08-05 DIAGNOSIS — R823 Hemoglobinuria: Secondary | ICD-10-CM | POA: Diagnosis present

## 2021-08-05 DIAGNOSIS — E119 Type 2 diabetes mellitus without complications: Secondary | ICD-10-CM | POA: Diagnosis not present

## 2021-08-05 DIAGNOSIS — E8809 Other disorders of plasma-protein metabolism, not elsewhere classified: Secondary | ICD-10-CM | POA: Diagnosis present

## 2021-08-05 LAB — RENAL FUNCTION PANEL
Albumin: 2.7 g/dL — ABNORMAL LOW (ref 3.5–5.0)
Anion gap: 12 (ref 5–15)
BUN: 35 mg/dL — ABNORMAL HIGH (ref 8–23)
CO2: 29 mmol/L (ref 22–32)
Calcium: 8.5 mg/dL — ABNORMAL LOW (ref 8.9–10.3)
Chloride: 94 mmol/L — ABNORMAL LOW (ref 98–111)
Creatinine, Ser: 3.34 mg/dL — ABNORMAL HIGH (ref 0.61–1.24)
GFR, Estimated: 18 mL/min — ABNORMAL LOW (ref >60)
Glucose, Bld: 130 mg/dL — ABNORMAL HIGH (ref 70–99)
Phosphorus: 4.6 mg/dL (ref 2.5–4.6)
Potassium: 4.5 mmol/L (ref 3.5–5.1)
Sodium: 135 mmol/L (ref 135–145)

## 2021-08-05 LAB — CBC WITH DIFFERENTIAL/PLATELET
Abs Immature Granulocytes: 0.04 10*3/uL (ref 0.00–0.07)
Basophils Absolute: 0.1 10*3/uL (ref 0.0–0.1)
Basophils Relative: 1 %
Eosinophils Absolute: 0.3 10*3/uL (ref 0.0–0.5)
Eosinophils Relative: 3 %
HCT: 29.2 % — ABNORMAL LOW (ref 39.0–52.0)
Hemoglobin: 8.8 g/dL — ABNORMAL LOW (ref 13.0–17.0)
Immature Granulocytes: 0 %
Lymphocytes Relative: 19 %
Lymphs Abs: 1.7 10*3/uL (ref 0.7–4.0)
MCH: 27.8 pg (ref 26.0–34.0)
MCHC: 30.1 g/dL (ref 30.0–36.0)
MCV: 92.4 fL (ref 80.0–100.0)
Monocytes Absolute: 1 10*3/uL (ref 0.1–1.0)
Monocytes Relative: 11 %
Neutro Abs: 5.8 10*3/uL (ref 1.7–7.7)
Neutrophils Relative %: 66 %
Platelets: 169 10*3/uL (ref 150–400)
RBC: 3.16 MIL/uL — ABNORMAL LOW (ref 4.22–5.81)
RDW: 16.7 % — ABNORMAL HIGH (ref 11.5–15.5)
WBC: 8.9 10*3/uL (ref 4.0–10.5)
nRBC: 0 % (ref 0.0–0.2)

## 2021-08-05 LAB — GLUCOSE, CAPILLARY
Glucose-Capillary: 90 mg/dL (ref 70–99)
Glucose-Capillary: 144 mg/dL — ABNORMAL HIGH (ref 70–99)
Glucose-Capillary: 215 mg/dL — ABNORMAL HIGH (ref 70–99)
Glucose-Capillary: 135 mg/dL — ABNORMAL HIGH (ref 70–99)

## 2021-08-05 LAB — PROTIME-INR
INR: 1.8 — ABNORMAL HIGH (ref 0.8–1.2)
Prothrombin Time: 20.6 seconds — ABNORMAL HIGH (ref 11.4–15.2)

## 2021-08-05 MED ORDER — HYDROCODONE BIT-HOMATROP MBR 5-1.5 MG/5ML PO SOLN
5.0000 mL | Freq: Four times a day (QID) | ORAL | Status: DC | PRN
  Administered 2021-08-05: 5 mL via ORAL
  Filled 2021-08-05: qty 5

## 2021-08-05 MED ORDER — POLYETHYLENE GLYCOL 3350 17 G PO PACK
17.0000 g | PACK | Freq: Every day | ORAL | Status: DC
  Administered 2021-08-05 – 2021-08-06 (×2): 17 g via ORAL
  Filled 2021-08-05 (×2): qty 1

## 2021-08-05 MED ORDER — WARFARIN SODIUM 5 MG PO TABS
5.0000 mg | ORAL_TABLET | Freq: Every day | ORAL | Status: DC
  Administered 2021-08-05 – 2021-08-06 (×2): 5 mg via ORAL
  Filled 2021-08-05 (×2): qty 1

## 2021-08-05 MED ORDER — MENTHOL 3 MG MT LOZG
1.0000 | LOZENGE | OROMUCOSAL | Status: DC | PRN
  Administered 2021-08-05: 3 mg via ORAL
  Filled 2021-08-05: qty 9

## 2021-08-05 MED ORDER — CARVEDILOL 3.125 MG PO TABS
3.1250 mg | ORAL_TABLET | Freq: Two times a day (BID) | ORAL | Status: DC
  Administered 2021-08-05 – 2021-08-06 (×3): 3.125 mg via ORAL
  Filled 2021-08-05 (×3): qty 1

## 2021-08-05 NOTE — Plan of Care (Signed)
  Problem: Acute Rehab PT Goals(only PT should resolve) Goal: Pt Will Go Supine/Side To Sit Flowsheets (Taken 08/05/2021 1212) Pt will go Supine/Side to Sit: with supervision Goal: Pt Will Go Sit To Supine/Side Flowsheets (Taken 08/05/2021 1212) Pt will go Sit to Supine/Side: with supervision Goal: Pt Will Ambulate Flowsheets (Taken 08/05/2021 1212) Pt will Ambulate:  50 feet  with supervision  with rolling walker

## 2021-08-05 NOTE — Progress Notes (Signed)
Noted patient had un-thickened liquids at beside, attempted to remove. Patient stated, do not move that, leave it there. Informed patient they are on thickened liquids and the risks of aspiration. Patient stated, I know but I will take the risk. MD Grandville Silos aware. Notified spouse. Placed honey thick liquids at bedside.

## 2021-08-05 NOTE — Progress Notes (Signed)
[place] for Warfarin Indication: atrial fibrillation  Allergies  Allergen Reactions   Lisinopril Other (See Comments)    Other reaction(s): Other (See Comments) Mouth,facial swelling  Mouth,facial swelling  Mouth,facial swelling      Patient Measurements: Height: 5\' 10"  (177.8 cm) Weight: 83.6 kg (184 lb 4.9 oz) IBW/kg (Calculated) : 73  Vital Signs: Temp: 98.2 F (36.8 C) (09/03 0544) Temp Source: Oral (09/03 0544) BP: 119/77 (09/03 0544) Pulse Rate: 69 (09/03 0544)  Labs: Recent Labs    08/02/21 1901 08/02/21 2157 08/03/21 0419 08/03/21 0924 08/04/21 0521 08/05/21 0408  HGB 9.0*  --   --  8.9* 8.7* 8.8*  HCT 29.7*  --   --  29.0* 29.2* 29.2*  PLT 199  --   --  180 195 169  LABPROT 24.0*  --  22.2*  --  23.0* 20.6*  INR 2.2*  --  2.0*  --  2.0* 1.8*  CREATININE 3.04*  --   --  3.63* 4.40* 3.34*  TROPONINIHS 27* 28*  --   --   --   --      Estimated Creatinine Clearance: 17.9 mL/min (A) (by C-G formula based on SCr of 3.34 mg/dL (H)).   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   Atrial fibrillation (HCC)    On Coumadin   Basal cell carcinoma 01/08/2017   right forearm ( txpbx)   COPD (chronic obstructive pulmonary disease) (HCC)    Deceased-donor kidney transplant 2015   Diabetic nephropathy (HCC)    End stage renal disease (HCC)    FSGS (focal segmental glomerulosclerosis)    HLD (hyperlipidemia)    HTN (hypertension)    Long-term use of immunosuppressant medication    SCC (squamous cell carcinoma) 05/10/2015   right side of face (cx46fu)   SCC (squamous cell carcinoma) 04/25/2016   right post scalp ( cx20fu)   SCC (squamous cell carcinoma) 08/27/2017   right post scalp (txpbx) insitu   SCC (squamous cell carcinoma) 05/05/2018   back of scalp (mohs) insitu   SCC (squamous cell carcinoma) 10/21/2018   top of right hand  (txpbx) well diff   SCC (squamous cell carcinoma) 10/21/2018   back of crown (txpbx) insitu   SCC (squamous cell carcinoma) 06/18/2019   back of crown ( cx20fu) insitu   Squamous cell carcinoma of skin 05/10/2015   right crown insitu (cx6fu)   Type 2 diabetes mellitus (HCC)     Medications:  See electronic med rec  Assessment: 80 y.o. M presents with episodes of SOB. Pt on warfarin PTA for afib. Admission INR 2.2 - therapeutic. CBC ok on admission. Home dose: 3mg  alternating with 6mg  - took 3mg  on 8/30  INR 1.8, subtherapeutic  Goal of Therapy:  INR 2-3 Monitor platelets by anticoagulation protocol: Yes   Plan:  Daily INR Warfarin 5mg  po daily  Monitor for S/S of bleeding  Thomasenia Sales, PharmD, MBA, BCGP Clinical Pharmacist  08/05/2021,7:48 AM

## 2021-08-05 NOTE — Progress Notes (Signed)
Telemetry called informing that patient has been having multiple PVC's and several pauses in his HR. Pausing  2.21,2.24,2.49. Charge nurse informed, MD informed. MD advised to hold morning dose of Coreg. Will continue to closely monitor patient.

## 2021-08-05 NOTE — Evaluation (Signed)
Physical Therapy Evaluation Patient Details Name: Kevin Frey MRN: [id number] DOB: 03-16-1941 Today's Date: 08/05/2021   History of Present Illness  Kevin Frey is a 80 y.o. male with medical history significant of other nonhemorrhagic thalamic stroke, chronic atrial fibrillation, COPD, disease [facility] kidney transplant, diabetic nephropathy, ESRD on hemodialysis, hyperlipidemia hypertension, multiple SCC lacerations of the skin, type 2 diabetes who is coming to the emergency department due to having to syncopal episodes in the last 2 hemodialysis sessions that he has participated.  He denied any prodromal symptoms or otherwise chest pain, dyspnea, palpitations diaphoresis, PND or orthopnea at any point recently.  He occasionally gets lower extremity edema.  Denied fever, chills, sore throat, rhinorrhea, productive cough, wheezing or hemoptysis.  No abdominal pain, nausea, emesis, diarrhea, constipation, melena or hematochezia.  No dysuria, frequency or hematuria.  Clinical Impression  Pt has home health services prior to admission.  Wife states that pt is close to baseline level and feels comfortable with bringing pt home.      Follow Up Recommendations   [facility]   Equipment Recommendations     none  Recommendations for Other Services       Precautions / Restrictions Precautions Precautions: Fall      Mobility  Bed Mobility Overal bed mobility: Needs Assistance Bed Mobility: Supine to Sit;Sit to Supine     Supine to sit: Min assist Sit to supine: Min assist   General bed mobility comments: Wife states this is normal    Transfers Overall transfer level: Needs assistance Equipment used: Rolling walker (2 wheeled) Transfers: Risk manager;Sit to/from Stand Sit to Stand: Min assist Stand pivot transfers: Supervision       General transfer comment: wift states that this is normal  Ambulation/Gait Ambulation/Gait assistance: Min guard Gait Distance (Feet): 25  Feet Assistive device: Rolling walker (2 wheeled) Gait Pattern/deviations: Decreased step length - right;Decreased step length - left Gait velocity: decreased Gait velocity interpretation: <1.31 ft/sec, indicative of household ambulator             Pertinent Vitals/Pain Pain Assessment: No/denies pain    Home Living Family/patient expects to be discharged to:: Private residence Living Arrangements: Spouse/significant other Available Help at Discharge: Family Type of Home: House Home Access: Ramped entrance     Home Layout: Two level Home Equipment: Environmental consultant - 2 wheels;Bedside commode      Prior Function Level of Independence: Needs assistance   Gait / Transfers Assistance Needed: wife SBA  ADL's / Homemaking Assistance Needed: Wife assits        Hand Dominance        Extremity/Trunk Assessment        Lower Extremity Assessment Lower Extremity Assessment: Generalized weakness       Communication      Cognition Arousal/Alertness: Awake/alert Behavior During Therapy: WFL for tasks assessed/performed Overall Cognitive Status: Within Functional Limits for tasks assessed                                        General Comments      Exercises General Exercises - Lower Extremity Ankle Circles/Pumps: Seated;Both;10 reps Heel Slides: Both;Seated;10 reps Hip Flexion/Marching: Seated;Both;10 reps Heel Raises: Both;10 reps;Supine   Assessment/Plan    PT Assessment Patient needs continued PT services  PT Problem List Decreased strength;Decreased activity tolerance       PT Treatment Interventions Gait training;Therapeutic exercise    PT  Goals (Current goals can be found in the Care Plan section)       Frequency Min 3X/week   Barriers to discharge           AM-PAC PT "6 Clicks" Mobility  Outcome Measure Help needed turning from your back to your side while in a flat bed without using bedrails?: A Little Help needed moving from  lying on your back to sitting on the side of a flat bed without using bedrails?: A Little Help needed moving to and from a bed to a chair (including a wheelchair)?: A Little Help needed standing up from a chair using your arms (e.g., wheelchair or bedside chair)?: A Little Help needed to walk in hospital room?: A Little Help needed climbing 3-5 steps with a railing? : A Little 6 Click Score: 18    End of Session Equipment Utilized During Treatment: Gait belt Activity Tolerance: Other (comment) (PT states that he is feeling woozy) Patient left: in bed;with call bell/phone within reach;with family/visitor present   PT Visit Diagnosis: Unsteadiness on feet (R26.81);Muscle weakness (generalized) (M62.81)    Time: 7903-8333 PT Time Calculation (min) (ACUTE ONLY): 33 min   Charges:   PT Evaluation $PT Eval Low Complexity: 1 Low PT Treatments $Therapeutic Exercise: 8-22 mins        Rayetta Humphrey, PT CLT [phone]  08/05/2021, 12:13 PM

## 2021-08-05 NOTE — Progress Notes (Signed)
[facility] night shift telemetry coverage note.  The nursing staff reported that the patient had recurrence of multiple PVCs associating with pauses and his heart rate ranging from 2.21 to 2.49 seconds.  He has had his telemetry reviewed by cardiology earlier and has had pauses as long as 2.8 seconds.  Coreg was held yesterday evening and will continue to hold today's morning dose.  Tennis Must, MD.

## 2021-08-05 NOTE — Progress Notes (Addendum)
PROGRESS NOTE    Kevin Frey  ZOX:[id number] DOB: 07-Apr-1941 DOA: 08/02/2021 PCP: Caryl Bis, MD    Chief Complaint  Patient presents with   Loss of Consciousness    Brief Narrative:  Patient is 80 year old gentleman history of hypertension, A. fib on Coumadin, AS, ESRD on HD, hypertension, peptic ulcer disease, chronic anemia presented to the ED with a syncopal episode after hemodialysis.  Cardiology and nephrology consulted.   Assessment & Plan:   Principal Problem:   Syncope and collapse Active Problems:   Atrial fibrillation, chronic (HCC)   Diabetes mellitus, type II, insulin dependent (HCC)   End stage renal disease on dialysis (HCC)   Hypoalbuminemia   Anemia in ESRD (end-stage renal disease) ([place])   Aortic valve stenosis  1 syncopal episode/orthostasis -Noted to have occurred post hemodialysis. -Per patient and wife during hemodialysis patient noted to have systolic blood pressures in the 80s despite taking midodrine 20 mg pretreatment. -It is noted that blood pressure remained low at the end of treatment patient had a syncopal episode then and recovered refused EMS.  Patient had another syncopal episode when he returned to his car and transported to the ED. -Blood pressure on presentation with systolics in the 811B. -Patient also with history of moderate to severe AS. -Head CT done negative for any acute abnormalities. -Patient with recent 2D echo 06/2021 with moderate to severe AS. -Carotid Dopplers done this hospitalization with no significant hemodynamic stenosis noted. -Patient seen in consultation by cardiology who reviewed telemetry and patient noted to have some episodes of bradycardia with heart rate in the 50s to 60s and pauses up to 2.8 seconds in A. fib. -Patient noted to be orthostatic on admission and still orthostatic on repeat orthostasis today however with some improvement.  Patient with no further syncopal episodes. -Coreg held on admission and  resumed at a lower dose of 3.125 mg twice daily per cardiology recommendations with consideration for outpatient monitor. -TED hose placed. -Patient placed on midodrine 10 mg twice daily with some improvement with orthostasis. -Supportive care. -Cardiology and nephrology were following.  2.  End-stage renal disease on HD (M[facility] ) -Patient end-stage renal disease status post DD KT in 2015 with recent graft failure and reinstitution of dialysis June 09, 2021. -Patient presented with syncopal episode post HD and noted during HD to have systolic blood pressures in the 80s despite midodrine 20 mg daily. -Nephrology consulted and patient scheduled for hemodialysis today with pretreatment with midodrine.  -Continue current dose of Myfortic. -Appreciate nephrology input and recommendations.  3.  Permanent atrial fibrillation -Noted to be on Coreg 12.5 mg twice daily for rate control prior to admission and Coumadin for anticoagulation. -Coreg held on admission and resumed at a lower dose of 3.125 mg twice daily per cardiology recommendations. -Patient seen by cardiology who are noting some episodes of bradycardia on telemetry with heart rates in the 50s to 60s and pauses up to 2.8 seconds while in A. fib. -Patient noted to have some multiple PVCs associated with pauses and heart rate with some pauses 2.21 to 2.49 seconds and as such Coreg was held this morning. -We will resume Coreg this evening. -INR at 1.8. -Coumadin per pharmacy for anticoagulation.   -Per cardiology.   4.  Abnormal echo -Per cardiology patient with recent TEE 06/2021 showing a large mobile echodensity in the RV and large mobile echodensity on aortic which could represent atheroma or thrombus.  It is noted that cardiac MRI during that admission  did not show any abnormalities. -INR 1.8. -Continue Coumadin. -Per cardiology.  5.  Aortic stenosis -Patient noted to have severe aortic stenosis per 2D echo 06/2021 with aortic  stenosis read as moderate by TEE during that admission. -Per cardiology on review of notes from Dr. Candis Musa conservative management was preferred at this time. -Outpatient follow-up with cardiology.  6.  Anemia of chronic disease -Hemoglobin stable at 8.8. -Per nephrology.   7.  Diabetes mellitus type 2, insulin-dependent -Hemoglobin A1c 4.9 (07/18/2021) -CBG 90. -Continue Semglee, SSI.    8.  Recent diagnosis of sinusitis -Keflex.  9.  BPH with chronic indwelling Foley catheter -Proscar. -Outpatient follow-up with urology for cystoscopy, voiding trial and cystoscopy urethrogram.  10.  History of peptic ulcer disease -PPI twice daily.  11.  Dysphagia -Tolerating dysphagia 3 diet with honey thick liquids.   -Per RN patient insistent on having some thin liquids and accepted risk of aspiration.   -Patient seen by SLP.   12.  Bacteriuria -Urinalysis with large leukocytes, nitrite negative, few bacteria, > 50 WBCs.  Urine cultures were not obtained on admission.  Patient afebrile.  Normal white count.  Patient on Keflex.  Patient recently treated for UTI during prior hospitalization    DVT prophylaxis: Coumadin Code Status: Full Family Communication: Updated patient and wife at bedside.  Disposition:   Status is: Observation  The patient remains OBS appropriate and will d/c before 2 midnights.  Dispo: The patient is from: Home              Anticipated d/c is to: Home              Patient currently is not medically stable to d/c.   Difficult to place patient No       Consultants:  Cardiology: Dr. Domenic Polite 08/03/2021 Nephrology: Dr. Johnney Ou 08/03/2021  Procedures:  CT head 08/02/2021 Chest x-ray 08/02/2021 Carotid Dopplers 08/03/2021     Antimicrobials:  Keflex 08/03/2021>>>>   Subjective: Sitting up in bed eating lunch.  Wife at bedside.  Denies any chest pain.  No shortness of breath.  No abdominal pain.  Some occasional coughing spells.  Per RN patient insistent on  having pain liquids as well on history.     Objective: Vitals:   08/04/21 2331 08/05/21 0500 08/05/21 0544 08/05/21 1413  BP:   119/77 102/60  Pulse:   69 71  Resp:   20 16  Temp:   98.2 F (36.8 C) 98.6 F (37 C)  TempSrc:   Oral   SpO2: 90%  100% 97%  Weight:  83.6 kg    Height:        Intake/Output Summary (Last 24 hours) at 08/05/2021 1545 Last data filed at 08/05/2021 0910 Gross per 24 hour  Intake 363 ml  Output 1693 ml  Net -1330 ml    Filed Weights   08/04/21 0500 08/04/21 1700 08/05/21 0500  Weight: 84.6 kg 83.1 kg 83.6 kg    Examination:  General exam: : NAD Respiratory system: CTA B anterior lung fields.  No wheezes, no rhonchi.  Speaking in full sentences.  Normal respiratory effort. Cardiovascular system: Regular rate and rhythm no murmurs rubs or gallops.  No JVD.  Trace lower extremity edema.  Gastrointestinal system: Abdomen soft, nontender, nondistended, positive bowel sounds.  No rebound.  No guarding. Central nervous system: Alert and oriented. No focal neurological deficits. Extremities: Symmetric 5 x 5 power. Skin: No rashes, lesions or ulcers Psychiatry: Judgement and insight appear normal. Mood &  affect appropriate.  Data Reviewed: I have personally reviewed following labs and imaging studies  CBC: Recent Labs  Lab 08/02/21 1901 08/03/21 0924 08/04/21 0521 08/05/21 0408  WBC 9.6 7.1 7.7 8.9  NEUTROABS 8.1* 4.9 4.7 5.8  HGB 9.0* 8.9* 8.7* 8.8*  HCT 29.7* 29.0* 29.2* 29.2*  MCV 91.7 90.6 92.1 92.4  PLT 199 180 195 169     Basic Metabolic Panel: Recent Labs  Lab 08/02/21 1901 08/03/21 0924 08/04/21 0521 08/05/21 0408  NA 133* 133* 135 135  K 4.4 4.3 4.8 4.5  CL 96* 98 94* 94*  CO2 28 28 28 29   GLUCOSE 228* 144* 162* 130*  BUN 35* 42* 53* 35*  CREATININE 3.04* 3.63* 4.40* 3.34*  CALCIUM 7.9* 8.2* 8.5* 8.5*  PHOS  --  5.4* 6.2* 4.6     GFR: Estimated Creatinine Clearance: 17.9 mL/min (A) (by C-G formula based on SCr of 3.34  mg/dL (H)).  Liver Function Tests: Recent Labs  Lab 08/02/21 1901 08/03/21 0924 08/04/21 0521 08/05/21 0408  AST 30  --   --   --   ALT 24  --   --   --   ALKPHOS 74  --   --   --   BILITOT 1.3*  --   --   --   PROT 6.1*  --   --   --   ALBUMIN 3.0* 2.9* 2.8* 2.7*     CBG: Recent Labs  Lab 08/04/21 1109 08/04/21 1633 08/04/21 2139 08/05/21 0737 08/05/21 1140  GLUCAP 160* 167* 99 90 144*      Recent Results (from the past 240 hour(s))  Resp Panel by RT-PCR (Flu A&B, Covid) Nasopharyngeal Swab     Status: None   Collection Time: 08/02/21 11:06 PM   Specimen: Nasopharyngeal Swab; Nasopharyngeal(NP) swabs in vial transport medium  Result Value Ref Range Status   SARS Coronavirus 2 by RT PCR NEGATIVE NEGATIVE Final    Comment: (NOTE) SARS-CoV-2 target nucleic acids are NOT DETECTED.  The SARS-CoV-2 RNA is generally detectable in upper respiratory specimens during the acute phase of infection. The lowest concentration of SARS-CoV-2 viral copies this assay can detect is 138 copies/mL. A negative result does not preclude SARS-Cov-2 infection and should not be used as the sole basis for treatment or other patient management decisions. A negative result may occur with  improper specimen collection/handling, submission of specimen other than nasopharyngeal swab, presence of viral mutation(s) within the areas targeted by this assay, and inadequate number of viral copies(<138 copies/mL). A negative result must be combined with clinical observations, patient history, and epidemiological information. The expected result is Negative.  Fact Sheet for Patients:  EntrepreneurPulse.com.au  Fact Sheet for Healthcare Providers:  IncredibleEmployment.be  This test is no t yet approved or cleared by the [place] FDA and  has been authorized for detection and/or diagnosis of SARS-CoV-2 by FDA under an Emergency Use Authorization (EUA). This  EUA will remain  in effect (meaning this test can be used) for the duration of the COVID-19 declaration under Section 564(b)(1) of the Act, 21 U.S.C.section 360bbb-3(b)(1), unless the authorization is terminated  or revoked sooner.       Influenza A by PCR NEGATIVE NEGATIVE Final   Influenza B by PCR NEGATIVE NEGATIVE Final    Comment: (NOTE) The Xpert Xpress SARS-CoV-2/FLU/RSV plus assay is intended as an aid in the diagnosis of influenza from Nasopharyngeal swab specimens and should not be used as a sole basis for treatment. Nasal  washings and aspirates are unacceptable for Xpert Xpress SARS-CoV-2/FLU/RSV testing.  Fact Sheet for Patients: EntrepreneurPulse.com.au  Fact Sheet for Healthcare Providers: IncredibleEmployment.be  This test is not yet approved or cleared by the [place] FDA and has been authorized for detection and/or diagnosis of SARS-CoV-2 by FDA under an Emergency Use Authorization (EUA). This EUA will remain in effect (meaning this test can be used) for the duration of the COVID-19 declaration under Section 564(b)(1) of the Act, 21 U.S.C. section 360bbb-3(b)(1), unless the authorization is terminated or revoked.  Performed at [facility], [address] [place], [place] [address]           Radiology Studies: No results found.      Scheduled Meds:  cephALEXin  500 mg Oral Daily   Chlorhexidine Gluconate Cloth  6 each Topical Q0600   finasteride  5 mg Oral Daily   insulin aspart  0-6 Units Subcutaneous TID WC   insulin glargine-yfgn  6 Units Subcutaneous QHS   midodrine  10 mg Oral BID WC   mycophenolate  360 mg Oral BID   pantoprazole  40 mg Oral BID   sodium chloride flush  3 mL Intravenous Q12H   warfarin  5 mg Oral q1600   Warfarin - Pharmacist Dosing Inpatient   Does not apply q1600   Continuous Infusions:  sodium chloride     sodium chloride       LOS: 0 days    Time spent: 35  minutes    Irine Seal, MD [facility]   To contact the attending provider between 7A-7P or the covering provider during after hours 7P-7A, please log into the web site [email or web address] and access using universal [place] password for that web site. If you do not have the password, please call the hospital operator.  08/05/2021, 3:45 PM

## 2021-08-05 NOTE — Progress Notes (Signed)
Pt O2 sat running between 89-92% on 5L O2. Denies SOB. Respirations 24 full minute. Respiratory called. Pt requested CPAP machine respiratory informed. Pt complained with his R shoulder bothering him. Tylenol given. Will continue to closely monitor patient.

## 2021-08-06 LAB — RENAL FUNCTION PANEL
Albumin: 2.8 g/dL — ABNORMAL LOW (ref 3.5–5.0)
Anion gap: 9 (ref 5–15)
BUN: 50 mg/dL — ABNORMAL HIGH (ref 8–23)
CO2: 28 mmol/L (ref 22–32)
Calcium: 8.3 mg/dL — ABNORMAL LOW (ref 8.9–10.3)
Chloride: 94 mmol/L — ABNORMAL LOW (ref 98–111)
Creatinine, Ser: 4.35 mg/dL — ABNORMAL HIGH (ref 0.61–1.24)
GFR, Estimated: 13 mL/min — ABNORMAL LOW (ref >60)
Glucose, Bld: 98 mg/dL (ref 70–99)
Phosphorus: 6.2 mg/dL — ABNORMAL HIGH (ref 2.5–4.6)
Potassium: 4.7 mmol/L (ref 3.5–5.1)
Sodium: 131 mmol/L — ABNORMAL LOW (ref 135–145)

## 2021-08-06 LAB — GLUCOSE, CAPILLARY
Glucose-Capillary: 97 mg/dL (ref 70–99)
Glucose-Capillary: 152 mg/dL — ABNORMAL HIGH (ref 70–99)
Glucose-Capillary: 173 mg/dL — ABNORMAL HIGH (ref 70–99)

## 2021-08-06 LAB — PROTIME-INR
INR: 2 — ABNORMAL HIGH (ref 0.8–1.2)
Prothrombin Time: 22.9 seconds — ABNORMAL HIGH (ref 11.4–15.2)

## 2021-08-06 MED ORDER — CARVEDILOL 3.125 MG PO TABS: 3.1250 mg | ORAL_TABLET | Freq: Two times a day (BID) | ORAL | 1 refills | Status: AC

## 2021-08-06 MED ORDER — MIDODRINE HCL 5 MG PO TABS: 5.0000 mg | ORAL_TABLET | Freq: Two times a day (BID) | ORAL | 1 refills | Status: AC

## 2021-08-06 NOTE — Discharge Summary (Signed)
Physician Discharge Summary  Kevin Frey OAC:[id number] DOB: Jan 08, 1941 DOA: 08/02/2021  PCP: Caryl Bis, MD  Admit date: 08/02/2021 Discharge date: 08/06/2021  Time spent: 60 minutes  Recommendations for Outpatient Follow-up:  Follow-up at hemodialysis center as scheduled on 08/07/2021. Follow-up with primary cardiologist, Dr.Assar in 1 to 2 weeks.  On follow-up patient's paroxysmal atrial fibrillation will need to be followed up upon as well as patient's aortic stenosis. Follow-up with Caryl Bis, MD in 2 weeks or as previously scheduled. Follow-up with Dr. Alyson Ingles, urology in 1 week or as previously scheduled for voiding trial, cystoscopy, cystourethrogram as previously scheduled.   Discharge Diagnoses:  Principal Problem:   Syncope and collapse Active Problems:   Atrial fibrillation, chronic (HCC)   Diabetes mellitus, type II, insulin dependent (HCC)   End stage renal disease on dialysis (HCC)   Hypoalbuminemia   Anemia in ESRD (end-stage renal disease) ([place])   Aortic valve stenosis   Discharge Condition: Stable and improved  Diet recommendation: Heart healthy/dysphagia 3 diet with thin liquids.  Filed Weights   08/04/21 1700 08/05/21 0500 08/06/21 0500  Weight: 83.1 kg 83.6 kg 85 kg    History of present illness:  HPI per Dr. Nathaneil Frey is a 80 y.o. male with medical history significant of other nonhemorrhagic thalamic stroke, chronic atrial fibrillation, COPD, disease [facility] kidney transplant, diabetic nephropathy, ESRD on hemodialysis, hyperlipidemia hypertension, multiple SCC lacerations of the skin, type 2 diabetes who is coming to the emergency department due to having to syncopal episodes in the last 2 hemodialysis sessions that he has participated.  He denied any prodromal symptoms or otherwise chest pain, dyspnea, palpitations diaphoresis, PND or orthopnea at any point recently.  He occasionally gets lower extremity edema.  Denied fever, chills,  sore throat, rhinorrhea, productive cough, wheezing or hemoptysis.  No abdominal pain, nausea, emesis, diarrhea, constipation, melena or hematochezia.  No dysuria, frequency or hematuria.   ED Course: Initial vital signs were temperature 97.9 F, pulse 76, respiration 22, BP 145/80 mmHg and O2 sat 100% on simple mask at 10 L.   Lab work: His urinalysis showed large hemoglobinuria and large leukocyte esterase.  WBC were more than 50 with a few bacteria.  CBC showed a white count 9.6, hemoglobin 9.0 g/dL platelets 199.  PT 24.0 and INR 2.2.  Troponin was 27 and then 28 ng/L.  CMP showed normal electrolytes when electrolytes corrected to glucose and albumin.  BUN was 35, creatinine 3.04 and glucose 228.  Total protein 6.1 albumin 3.0 g/dL.  Total bilirubin was 1.3 mg/dL.  Imaging: A portable 1 view chest radiograph shows stable cardiomegaly and vascular congestion.  There is mild to moderate severity bivascular atelectasis or infiltrate.  Small right pleural effusion.  CT head without contrast did not show any acute intracranial pathology.   Hospital Course:  1 syncopal episode/orthostasis -Noted to have occurred post hemodialysis. -Per patient and wife during hemodialysis patient noted to have systolic blood pressures in the 80s despite taking midodrine 20 mg pretreatment. -It is noted that blood pressure remained low at the end of treatment patient had a syncopal episode then and recovered refused EMS.  Patient had another syncopal episode when he returned to his car and transported to the ED. -Blood pressure on presentation with systolics in the 010X. -Patient also with history of moderate to severe AS. -Head CT done negative for any acute abnormalities. -Patient with recent 2D echo 06/2021 with moderate to severe AS. -Carotid Dopplers done this  hospitalization with no significant hemodynamic stenosis noted. -Patient seen in consultation by cardiology who reviewed telemetry and patient noted to have  some episodes of bradycardia with heart rate in the 50s to 60s and pauses up to 2.8 seconds in A. fib. -Patient noted to be orthostatic on admission and still orthostatic on repeat orthostasis however with improvement.  Patient with no further syncopal episodes. -Coreg held on admission and resumed at a lower dose of 3.125 mg twice daily per cardiology recommendations with consideration for outpatient monitor. -TED hose placed. -Patient placed on midodrine 10 mg twice daily with improvement with orthostasis -Cardiology and nephrology assessed and followed the patient throughout the hospitalization.   -Improved clinically had no further syncopal episodes.   -Outpatient follow-up with nephrology and cardiology.    2.  End-stage renal disease on HD (MWF [facility] ) -Patient with end-stage renal disease status post DD KT in 2015 with recent graft failure and reinstitution of dialysis June 09, 2021. -Patient presented with syncopal episode post HD and noted during HD to have systolic blood pressures in the 80s despite midodrine 20 mg daily. -Nephrology consulted and patient scheduled for hemodialysis on his HD days during the hospitalization with midodrine pretreatment.  -Patient maintained on home regimen of Myfortic.   -Outpatient follow-up in hemodialysis as previously scheduled on Monday, 08/07/2021.  3.  Permanent atrial fibrillation -Noted to be on Coreg 12.5 mg twice daily for rate control prior to admission and Coumadin for anticoagulation. -Coreg held on admission and resumed at a lower dose of 3.125 mg twice daily per cardiology recommendations. -Patient seen by cardiology who noted some episodes of bradycardia on telemetry with heart rates in the 50s to 60s and pauses up to 2.8 seconds while in A. fib. -Patient noted to have some multiple PVCs associated with pauses and heart rate with some pauses 2.21 to 2.49 seconds and as such Coreg was held for 1 dose and subsequently resumed. -INR noted at  2.0 by day of discharge. -Patient maintained on Coumadin per pharmacy. -Patient seen by cardiology in the hospitalization will follow up with cardiology in the outpatient setting.[name]  4.  Abnormal echo -Per cardiology patient with recent TEE 06/2021 showing a large mobile echodensity in the RV and large mobile echodensity on aortic which could represent atheroma or thrombus.  It is noted that cardiac MRI during that admission did not show any abnormalities. -Patient maintained on Coumadin per pharmacy and INR was 2.0 by day of discharge. -Patient was seen and followed by cardiology during the hospitalization. -Outpatient follow-up.  5.  Aortic stenosis -Patient noted to have severe aortic stenosis per 2D echo 06/2021 with aortic stenosis read as moderate by TEE during that admission. -Per cardiology on review of notes from Dr. Candis Musa conservative management was preferred at this time. -Outpatient follow-up with cardiology.  6.  Anemia of chronic disease -Hemoglobin remained stable at 8.8 by day of discharge.   -Outpatient follow-up with nephrology.    7.  Diabetes mellitus type 2, insulin-dependent -Hemoglobin A1c 4.9 (07/18/2021) -Patient maintained on Semglee and SSI during the hospitalization.   -Outpatient follow-up.  8.  Recent diagnosis of sinusitis -Patient continued on home regimen of Keflex he was on prior to admission.  Outpatient follow-up.   9.  BPH with chronic indwelling Foley catheter -Patient maintained on home regimen Proscar. -Foley catheter was continued during the hospitalization. -Outpatient follow-up with urology for cystoscopy, voiding trial and cystoscopy urethrogram as previously scheduled.  10.  History of peptic ulcer  disease -Patient maintained on PPI twice daily.  11.  Dysphagia -Tolerated dysphagia 3 diet with honey thick liquids as recommended per speech therapy..   -Per RN patient insistent on having some thin liquids and accepted risk of aspiration.    -Outpatient follow-up.  12.  Bacteriuria -Urinalysis with large leukocytes, nitrite negative, few bacteria, > 50 WBCs.  Urine cultures were not obtained on admission.  Patient afebrile.  Normal white count.  Patient on Keflex.  Patient recently treated for UTI during prior hospitalization.  Outpatient follow-up      Procedures: CT head 08/02/2021 Chest x-ray 08/02/2021 Carotid Dopplers 08/03/2021  Consultations:       Cardiology: Dr. Domenic Polite 08/03/2021 Nephrology: Dr. Johnney Ou 08/03/2021    Discharge Exam: Vitals:   08/06/21 0500 08/06/21 1403  BP: 116/70 110/68  Pulse: 65 (!) 54  Resp: 20   Temp: 98.4 F (36.9 C) 98.4 F (36.9 C)  SpO2:  100%    General: NAD Cardiovascular: RRR with 3/6 systolic ejection murmur.  No lower extremity edema. Respiratory: Lungs clear to auscultation bilaterally anterior lung fields.  Discharge Instructions   Discharge Instructions     Diet - low sodium heart healthy   Complete by: As directed    Dysphagia 3 diet with thin liquids   Discharge wound care:   Complete by: As directed    As above.   Increase activity slowly   Complete by: As directed       Allergies as of 08/06/2021       Reactions   Lisinopril Other (See Comments)   Other reaction(s): Other (See Comments) Mouth,facial swelling  Mouth,facial swelling  Mouth,facial swelling         Medication List     STOP taking these medications    azithromycin 250 MG tablet Commonly known as: ZITHROMAX   cefdinir 300 MG capsule Commonly known as: OMNICEF   tamsulosin 0.4 MG Caps capsule Commonly known as: FLOMAX       TAKE these medications    acetaminophen 500 MG tablet Commonly known as: TYLENOL Take 500 mg by mouth in the morning and at bedtime.   carvedilol 3.125 MG tablet Commonly known as: COREG Take 1 tablet (3.125 mg total) by mouth 2 (two) times daily with a meal. What changed:  medication strength how much to take   cephALEXin 500 MG  capsule Commonly known as: KEFLEX Take 500 mg by mouth daily.   Cholecalciferol 25 MCG (1000 UT) tablet Take by mouth daily.   cycloSPORINE modified 100 MG capsule Commonly known as: NEORAL Take 1 capsule by mouth 2 (two) times daily.   diclofenac Sodium 1 % Gel Commonly known as: VOLTAREN Apply topically as needed.   finasteride 5 MG tablet Commonly known as: PROSCAR Take 1 tablet (5 mg total) by mouth daily.   FreeStyle Libre 14 Day Reader Kerrin Mo See admin instructions.   FreeStyle Libre 14 Day Sensor Misc   insulin glargine 100 UNIT/ML injection Commonly known as: LANTUS Inject 6 Units into the skin at bedtime.   ipratropium-albuterol 0.5-2.5 (3) MG/3ML Soln Commonly known as: DUONEB Inhale 3 mLs into the lungs every 6 (six) hours as needed (SOB/wheezing).   midodrine 5 MG tablet Commonly known as: PROAMATINE Take 1 tablet (5 mg total) by mouth 2 (two) times daily with a meal. What changed: additional instructions   multivitamin Tabs tablet Take 1 tablet by mouth daily.   mycophenolate 180 MG EC tablet Commonly known as: MYFORTIC Take 1 tablet (180 mg  total) by mouth 2 (two) times daily. What changed: how much to take   Omega 3 1000 MG Caps Take 1 capsule by mouth daily.   pantoprazole 40 MG tablet Commonly known as: PROTONIX Take 40 mg by mouth 2 (two) times daily.   warfarin 6 MG tablet Commonly known as: COUMADIN Take 6 mg by mouth See admin instructions. Take 6 mg every other day and 3 mg on opposite days.               Discharge Care Instructions  (From admission, onward)           Start     Ordered   08/06/21 0000  Discharge wound care:       Comments: As above.   08/06/21 1528           Allergies  Allergen Reactions   Lisinopril Other (See Comments)    Other reaction(s): Other (See Comments) Mouth,facial swelling  Mouth,facial swelling  Mouth,facial swelling      Follow-up Information     [facility] Follow up.   Why:  Home health Contact information: [address] [place] [address] [phone]         [facility] Follow up.   Why: Schedule transporation Contact information: [phone]        Caryl Bis, MD. Schedule an appointment as soon as possible for a visit in 2 week(s).   Specialty: Family Medicine Contact information: [place] [address] [phone]         HD as scheduled Follow up on 08/07/2021.          Assar, Soheil, DO. Schedule an appointment as soon as possible for a visit in 1 week(s).   Specialty: Cardiology Why: f/u in 1-2 weeks. Contact information: [address] [place] [address] [phone]         Cleon Gustin, MD Follow up in 1 week(s).   Specialty: Urology Why: f/u as scheduled or in 1 week. Contact information: [address] [place] [address] [phone]                  The results of significant diagnostics from this hospitalization (including imaging, microbiology, ancillary and laboratory) are listed below for reference.    Significant Diagnostic Studies: CT HEAD WO CONTRAST (5MM)  Result Date: 08/02/2021 CLINICAL DATA:  Head trauma. EXAM: CT HEAD WITHOUT CONTRAST TECHNIQUE: Contiguous axial images were obtained from the base of the skull through the vertex without intravenous contrast. COMPARISON:  Head CT dated 07/31/2021. FINDINGS: Brain: There is moderate age-related atrophy and chronic microvascular ischemic changes. Bilateral basal ganglia calcifications noted. There is no acute intracranial hemorrhage. No mass effect or midline shift. No extra-axial fluid collection. Vascular: No hyperdense vessel or unexpected calcification. Skull: Normal. Negative for fracture or focal lesion. Sinuses/Orbits: Complete opacification of the visualized maxillary and sphenoid sinuses. No air-fluid level. The mastoid air cells are clear. Other: None IMPRESSION: 1. No acute intracranial pathology. 2.  Moderate age-related atrophy and chronic microvascular ischemic changes. 3. Paranasal sinus disease. Electronically Signed   By: Anner Crete M.D.   On: 08/02/2021 19:43   US Carotid Bilateral  Result Date: 08/03/2021 CLINICAL DATA:  Syncope, fall EXAM: BILATERAL CAROTID DUPLEX ULTRASOUND TECHNIQUE: Pearline Cables scale imaging, color Doppler and duplex ultrasound were performed of bilateral carotid and vertebral arteries in the neck. COMPARISON:  None. FINDINGS: Criteria: Quantification of carotid stenosis is based on velocity  parameters that correlate the residual internal carotid diameter with NASCET-based stenosis levels, using the diameter of the distal internal carotid lumen as the denominator for stenosis measurement. The following velocity measurements were obtained: RIGHT ICA: 71/21 cm/sec CCA: 25/36 cm/sec SYSTOLIC ICA/CCA RATIO:  1.2 ECA: 40 cm/sec LEFT ICA: 77/24 cm/sec CCA: 64/40 cm/sec SYSTOLIC ICA/CCA RATIO:  2.1 ECA: 43 cm/sec RIGHT CAROTID ARTERY: Minimal atherosclerotic plaque in the carotid bulb. No significant stenosis. RIGHT VERTEBRAL ARTERY:  Antegrade flow LEFT CAROTID ARTERY: Minimal atherosclerotic plaque in the carotid bulb. No significant stenosis. LEFT VERTEBRAL ARTERY:  Antegrade flow IMPRESSION: No evidence of hemodynamically significant stenosis involving either the right or left carotid circulation in the neck by Doppler criteria. Minimal atherosclerotic plaque in the bilateral carotid bulbs results in no significant stenosis. Electronically Signed   By: Albin Felling M.D.   On: 08/03/2021 13:23   DG Chest Portable 1 View  Result Date: 08/02/2021 CLINICAL DATA:  Shortness of breath. EXAM: PORTABLE CHEST 1 VIEW COMPARISON:  July 31, 2021 FINDINGS: There is stable right internal jugular venous catheter positioning. Low lung volumes are seen with chronic appearing increased interstitial lung markings. Mild to moderate severity areas of atelectasis and/or infiltrate are again seen  within the bilateral lung bases. There is a small right pleural effusion. No pneumothorax is identified. The cardiac silhouette is enlarged and unchanged in size. The visualized skeletal structures are unremarkable. IMPRESSION: 1. Stable cardiomegaly and vascular congestion mild to moderate severity bibasilar atelectasis and/or infiltrate. 2. Small right pleural effusion. Electronically Signed   By: Virgina Norfolk M.D.   On: 08/02/2021 19:52   DG Chest Port 1 View  Result Date: 07/17/2021 CLINICAL DATA:  Questionable sepsis. States pt was at dialysis, pt hypotensive upon arrival, became hard to arouse, on oxygen per patient. Sats at 70% on 4L. Afib on monitor hx of COPD and HTN, former smoker. EXAM: PORTABLE CHEST 1 VIEW COMPARISON:  Chest x-ray 05/31/2025. FINDINGS: Right chest wall dialysis catheter upper with tip overlying the expected region of superior caval junction. Cardiac silhouette slightly prominent possibly due to portable AP technique and low lung volumes. The heart size and mediastinal contours are within normal limits. Prominence of the hilar vasculature. Aortic calcification. Low lung volumes. No focal consolidation. Increased interstitial markings. Bilateral trace to small volume pleural effusions. No pneumothorax. No acute osseous abnormality. IMPRESSION: Slightly prominent cardiac silhouette. Associated pulmonary edema with bilateral trace to small volume pleural effusions. Superimposed infection/inflammation not excluded. Electronically Signed   By: Iven Finn M.D.   On: 07/17/2021 18:20    Microbiology: Recent Results (from the past 240 hour(s))  Resp Panel by RT-PCR (Flu A&B, Covid) Nasopharyngeal Swab     Status: None   Collection Time: 08/02/21 11:06 PM   Specimen: Nasopharyngeal Swab; Nasopharyngeal(NP) swabs in vial transport medium  Result Value Ref Range Status   SARS Coronavirus 2 by RT PCR NEGATIVE NEGATIVE Final    Comment: (NOTE) SARS-CoV-2 target nucleic acids  are NOT DETECTED.  The SARS-CoV-2 RNA is generally detectable in upper respiratory specimens during the acute phase of infection. The lowest concentration of SARS-CoV-2 viral copies this assay can detect is 138 copies/mL. A negative result does not preclude SARS-Cov-2 infection and should not be used as the sole basis for treatment or other patient management decisions. A negative result may occur with  improper specimen collection/handling, submission of specimen other than nasopharyngeal swab, presence of viral mutation(s) within the areas targeted by this assay, and inadequate number of  viral copies(<138 copies/mL). A negative result must be combined with clinical observations, patient history, and epidemiological information. The expected result is Negative.  Fact Sheet for Patients:  EntrepreneurPulse.com.au  Fact Sheet for Healthcare Providers:  IncredibleEmployment.be  This test is no t yet approved or cleared by the [place] FDA and  has been authorized for detection and/or diagnosis of SARS-CoV-2 by FDA under an Emergency Use Authorization (EUA). This EUA will remain  in effect (meaning this test can be used) for the duration of the COVID-19 declaration under Section 564(b)(1) of the Act, 21 U.S.C.section 360bbb-3(b)(1), unless the authorization is terminated  or revoked sooner.       Influenza A by PCR NEGATIVE NEGATIVE Final   Influenza B by PCR NEGATIVE NEGATIVE Final    Comment: (NOTE) The Xpert Xpress SARS-CoV-2/FLU/RSV plus assay is intended as an aid in the diagnosis of influenza from Nasopharyngeal swab specimens and should not be used as a sole basis for treatment. Nasal washings and aspirates are unacceptable for Xpert Xpress SARS-CoV-2/FLU/RSV testing.  Fact Sheet for Patients: EntrepreneurPulse.com.au  Fact Sheet for Healthcare Providers: IncredibleEmployment.be  This test is  not yet approved or cleared by the [place] FDA and has been authorized for detection and/or diagnosis of SARS-CoV-2 by FDA under an Emergency Use Authorization (EUA). This EUA will remain in effect (meaning this test can be used) for the duration of the COVID-19 declaration under Section 564(b)(1) of the Act, 21 U.S.C. section 360bbb-3(b)(1), unless the authorization is terminated or revoked.  Performed at [facility], [address], [place], [place] [address]      Labs: Basic Metabolic Panel: Recent Labs  Lab 08/02/21 1901 08/03/21 0924 08/04/21 0521 08/05/21 0408 08/06/21 0558  NA 133* 133* 135 135 131*  K 4.4 4.3 4.8 4.5 4.7  CL 96* 98 94* 94* 94*  CO2 28 28 28 29 28   GLUCOSE 228* 144* 162* 130* 98  BUN 35* 42* 53* 35* 50*  CREATININE 3.04* 3.63* 4.40* 3.34* 4.35*  CALCIUM 7.9* 8.2* 8.5* 8.5* 8.3*  PHOS  --  5.4* 6.2* 4.6 6.2*   Liver Function Tests: Recent Labs  Lab 08/02/21 1901 08/03/21 0924 08/04/21 0521 08/05/21 0408 08/06/21 0558  AST 30  --   --   --   --   ALT 24  --   --   --   --   ALKPHOS 74  --   --   --   --   BILITOT 1.3*  --   --   --   --   PROT 6.1*  --   --   --   --   ALBUMIN 3.0* 2.9* 2.8* 2.7* 2.8*   No results for input(s): LIPASE, AMYLASE in the last 168 hours. No results for input(s): AMMONIA in the last 168 hours. CBC: Recent Labs  Lab 08/02/21 1901 08/03/21 0924 08/04/21 0521 08/05/21 0408  WBC 9.6 7.1 7.7 8.9  NEUTROABS 8.1* 4.9 4.7 5.8  HGB 9.0* 8.9* 8.7* 8.8*  HCT 29.7* 29.0* 29.2* 29.2*  MCV 91.7 90.6 92.1 92.4  PLT 199 180 195 169   Cardiac Enzymes: No results for input(s): CKTOTAL, CKMB, CKMBINDEX, TROPONINI in the last 168 hours. BNP: BNP (last 3 results) Recent Labs    07/17/21 1828  BNP 1,278.0*    ProBNP (last 3 results) No results for input(s): PROBNP in the last 8760 hours.  CBG: Recent Labs  Lab 08/05/21 1140 08/05/21 1625 08/05/21 2143 08/06/21 0722 08/06/21 1114  GLUCAP  144* 215* 135*  97 152*       Signed:  Irine Seal MD.  [facility] 08/06/2021, 4:19 PM

## 2021-08-06 NOTE — Progress Notes (Signed)
[place] for Warfarin Indication: atrial fibrillation  Allergies  Allergen Reactions   Lisinopril Other (See Comments)    Other reaction(s): Other (See Comments) Mouth,facial swelling  Mouth,facial swelling  Mouth,facial swelling      Patient Measurements: Height: 5\' 10"  (177.8 cm) Weight: 85 kg (187 lb 6.3 oz) IBW/kg (Calculated) : 73  Vital Signs: Temp: 98.4 F (36.9 C) (09/04 0500) BP: 116/70 (09/04 0500) Pulse Rate: 65 (09/04 0500)  Labs: Recent Labs    08/03/21 0924 08/04/21 0521 08/05/21 0408 08/06/21 0558  HGB 8.9* 8.7* 8.8*  --   HCT 29.0* 29.2* 29.2*  --   PLT 180 195 169  --   LABPROT  --  23.0* 20.6* 22.9*  INR  --  2.0* 1.8* 2.0*  CREATININE 3.63* 4.40* 3.34* 4.35*     Estimated Creatinine Clearance: 13.8 mL/min (A) (by C-G formula based on SCr of 4.35 mg/dL (H)).   Medical History: Past Medical History:  Diagnosis Date   Acute ischemic vertebrobasilar artery thalamic stroke ([place]) 2015   Atrial fibrillation (HCC)    On Coumadin   Basal cell carcinoma 01/08/2017   right forearm ( txpbx)   COPD (chronic obstructive pulmonary disease) (HCC)    Deceased-donor kidney transplant 2015   Diabetic nephropathy (HCC)    End stage renal disease (HCC)    FSGS (focal segmental glomerulosclerosis)    HLD (hyperlipidemia)    HTN (hypertension)    Long-term use of immunosuppressant medication    SCC (squamous cell carcinoma) 05/10/2015   right side of face ([id number])   SCC (squamous cell carcinoma) 04/25/2016   right post scalp ( cx8fu)   SCC (squamous cell carcinoma) 08/27/2017   right post scalp (txpbx) insitu   SCC (squamous cell carcinoma) 05/05/2018   back of scalp (mohs) insitu   SCC (squamous cell carcinoma) 10/21/2018   top of right hand (txpbx) well diff   SCC (squamous cell carcinoma) 10/21/2018   back of crown (txpbx) insitu   SCC (squamous cell carcinoma) 06/18/2019   back of crown ( cx8fu) insitu    Squamous cell carcinoma of skin 05/10/2015   right crown insitu ([id number])   Type 2 diabetes mellitus (HCC)     Medications:  See electronic med rec  Assessment: 80 y.o. M presents with episodes of SOB. Pt on warfarin PTA for afib. Admission INR 2.2 - therapeutic. CBC ok on admission. Home dose: 3mg  alternating with 6mg  - took 3mg  on 8/30  INR 2.0, therapeutic  Goal of Therapy:  INR 2-3 Monitor platelets by anticoagulation protocol: Yes   Plan:  Daily INR Warfarin 5mg  po daily  Monitor for S/S of bleeding  Thomasenia Sales, PharmD, MBA, BCGP Clinical Pharmacist  08/06/2021,8:10 AM

## 2021-08-06 NOTE — TOC Transition Note (Signed)
Transition of Care [facility]) - CM/SW Discharge Note   Patient Details  Name: Kevin Frey MRN: [id number] Date of Birth: 10-Sep-1941  Transition of Care [facility]) CM/SW Contact:  Natasha Bence, LCSW Phone Number: 08/06/2021, 1:56 PM   Clinical Narrative:    CSW notified of patient's readiness for discharge. CSW contacted patient's wife to inquire about HH. Patient's wife reported that patient is active with [facility]. CSW notified Santiago Glad with [facility]. Santiago Glad agreeable to resume services. Patient's wife requested information for transportation for dialysis. CSW provides [facility] information. TOC signing off.   Final next level of care: [place] Barriers to Discharge: Barriers Resolved   Patient Goals and CMS Choice Patient states their goals for this hospitalization and ongoing recovery are:: Return home with [facility] CMS Medicare.gov Compare Post Acute Care list provided to:: Patient Choice offered to / list presented to : Patient  Discharge Placement                    Patient and family notified of of transfer: 08/06/21  Discharge Plan and Services                          HH Arranged: PT, RN [facility] Agency: [place] Date [place]: 08/06/21 Time [place]: [id number] Representative spoke with at [place]: [place] ([place]) Interventions     Readmission Risk Interventions Readmission Risk Prevention Plan 07/18/2021  Transportation Screening Complete  HRI or [place] Complete  Social Work Consult for [place] Planning/Counseling Complete  Palliative Care Screening Not Applicable  Medication Review Press photographer) Complete  Some recent data might be hidden

## 2021-08-06 NOTE — Progress Notes (Signed)
Patient discharged home with home health, transported home by spouse. Discharge paperwork went over and given to wife, verbalized understanding of paperwork. Wife concerned about Keflex medication. MD [place] Silos aware. Informed wife that patient had been on medication prior to admission and that MD did not need to order medication. Belongings sent home with patient. Patient stable upon discharge.

## 2021-08-07 DIAGNOSIS — Z992 Dependence on renal dialysis: Secondary | ICD-10-CM | POA: Diagnosis not present

## 2021-08-07 DIAGNOSIS — D509 Iron deficiency anemia, unspecified: Secondary | ICD-10-CM | POA: Diagnosis not present

## 2021-08-07 DIAGNOSIS — N186 End stage renal disease: Secondary | ICD-10-CM | POA: Diagnosis not present

## 2021-08-07 DIAGNOSIS — D631 Anemia in chronic kidney disease: Secondary | ICD-10-CM | POA: Diagnosis not present

## 2021-08-09 ENCOUNTER — Encounter: Payer: Self-pay | Admitting: Urology

## 2021-08-09 ENCOUNTER — Other Ambulatory Visit: Payer: Self-pay

## 2021-08-09 ENCOUNTER — Ambulatory Visit (INDEPENDENT_AMBULATORY_CARE_PROVIDER_SITE_OTHER): Payer: Medicare Other | Admitting: Urology

## 2021-08-09 VITALS — BP 116/70 | HR 74

## 2021-08-09 DIAGNOSIS — D631 Anemia in chronic kidney disease: Secondary | ICD-10-CM | POA: Diagnosis not present

## 2021-08-09 DIAGNOSIS — Z992 Dependence on renal dialysis: Secondary | ICD-10-CM | POA: Diagnosis not present

## 2021-08-09 DIAGNOSIS — I469 Cardiac arrest, cause unspecified: Secondary | ICD-10-CM | POA: Diagnosis not present

## 2021-08-09 DIAGNOSIS — D509 Iron deficiency anemia, unspecified: Secondary | ICD-10-CM | POA: Diagnosis not present

## 2021-08-09 DIAGNOSIS — N186 End stage renal disease: Secondary | ICD-10-CM | POA: Diagnosis not present

## 2021-08-09 DIAGNOSIS — R4020 Unspecified coma: Secondary | ICD-10-CM | POA: Diagnosis not present

## 2021-08-09 DIAGNOSIS — I499 Cardiac arrhythmia, unspecified: Secondary | ICD-10-CM | POA: Diagnosis not present

## 2021-08-09 DIAGNOSIS — R339 Retention of urine, unspecified: Secondary | ICD-10-CM | POA: Diagnosis not present

## 2021-08-09 DIAGNOSIS — R0902 Hypoxemia: Secondary | ICD-10-CM | POA: Diagnosis not present

## 2021-08-09 NOTE — Patient Instructions (Signed)
Benign Prostatic Hyperplasia Benign prostatic hyperplasia (BPH) is an enlarged prostate gland that is caused by the normal aging process and not by cancer. The prostate is a walnut-sized gland that is involved in the production of semen. It is located in front of the rectum and below the bladder. The bladder stores urine and the urethra is the tube that carries the urine out of the body. The prostate may get bigger as a man gets older. An enlarged prostate can press on the urethra. This can make it harder to pass urine. The build-up of urine in the bladder can cause infection. Back pressure and infection may progress to bladder damage and kidney (renal) failure. What are the causes? This condition is part of a normal aging process. However, not all men develop problems from this condition. If the prostate enlarges away from the urethra, urine flow will not be blocked. If it enlarges toward the urethra and compresses it, there will be problems passing urine. What increases the risk? This condition is more likely to develop in men over the age of 50 years. What are the signs or symptoms? Symptoms of this condition include: Getting up often during the night to urinate. Needing to urinate frequently during the day. Difficulty starting urine flow. Decrease in size and strength of your urine stream. Leaking (dribbling) after urinating. Inability to pass urine. This needs immediate treatment. Inability to completely empty your bladder. Pain when you pass urine. This is more common if there is also an infection. Urinary tract infection (UTI). How is this diagnosed? This condition is diagnosed based on your medical history, a physical exam, and your symptoms. Tests will also be done, such as: A post-void bladder scan. This measures any amount of urine that may remain in your bladder after you finish urinating. A digital rectal exam. In a rectal exam, your health care provider checks your prostate by  putting a lubricated, gloved finger into your rectum to feel the back of your prostate gland. This exam detects the size of your gland and any abnormal lumps or growths. An exam of your urine (urinalysis). A prostate specific antigen (PSA) screening. This is a blood test used to screen for prostate cancer. An ultrasound. This test uses sound waves to electronically produce a picture of your prostate gland. Your health care provider may refer you to a specialist in kidney and prostate diseases (urologist). How is this treated? Once symptoms begin, your health care provider will monitor your condition (active surveillance or watchful waiting). Treatment for this condition will depend on the severity of your condition. Treatment may include: Observation and yearly exams. This may be the only treatment needed if your condition and symptoms are mild. Medicines to relieve your symptoms, including: Medicines to shrink the prostate. Medicines to relax the muscle of the prostate. Surgery in severe cases. Surgery may include: Prostatectomy. In this procedure, the prostate tissue is removed completely through an open incision or with a laparoscope or robotics. Transurethral resection of the prostate (TURP). In this procedure, a tool is inserted through the opening at the tip of the penis (urethra). It is used to cut away tissue of the inner core of the prostate. The pieces are removed through the same opening of the penis. This removes the blockage. Transurethral incision (TUIP). In this procedure, small cuts are made in the prostate. This lessens the prostate's pressure on the urethra. Transurethral microwave thermotherapy (TUMT). This procedure uses microwaves to create heat. The heat destroys and removes a   small amount of prostate tissue. Transurethral needle ablation (TUNA). This procedure uses radio frequencies to destroy and remove a small amount of prostate tissue. Interstitial laser coagulation (ILC).  This procedure uses a laser to destroy and remove a small amount of prostate tissue. Transurethral electrovaporization (TUVP). This procedure uses electrodes to destroy and remove a small amount of prostate tissue. Prostatic urethral lift. This procedure inserts an implant to push the lobes of the prostate away from the urethra. Follow these instructions at home: Take over-the-counter and prescription medicines only as told by your health care provider. Monitor your symptoms for any changes. Contact your health care provider with any changes. Avoid drinking large amounts of liquid before going to bed or out in public. Avoid or reduce how much caffeine or alcohol you drink. Give yourself time when you urinate. Keep all follow-up visits as told by your health care provider. This is important. Contact a health care provider if: You have unexplained back pain. Your symptoms do not get better with treatment. You develop side effects from the medicine you are taking. Your urine becomes very dark or has a bad smell. Your lower abdomen becomes distended and you have trouble passing your urine. Get help right away if: You have a fever or chills. You suddenly cannot urinate. You feel lightheaded, or very dizzy, or you faint. There are large amounts of blood or clots in the urine. Your urinary problems become hard to manage. You develop moderate to severe low back or flank pain. The flank is the side of your body between the ribs and the hip. These symptoms may represent a serious problem that is an emergency. Do not wait to see if the symptoms will go away. Get medical help right away. Call your local emergency services (911 in the [place] Do not drive yourself to the hospital. Summary Benign prostatic hyperplasia (BPH) is an enlarged prostate that is caused by the normal aging process and not by cancer. An enlarged prostate can press on the urethra. This can make it hard to pass urine. This  condition is part of a normal aging process and is more likely to develop in men over the age of 50 years. Get help right away if you suddenly cannot urinate. This information is not intended to replace advice given to you by your health care provider. Make sure you discuss any questions you have with your health care provider. Document Revised: 03/01/2021 Document Reviewed: 07/28/2020 Elsevier Patient Education  2022 [employer]  

## 2021-08-09 NOTE — Progress Notes (Signed)
   08/08/2021  CC: Urinary retention   HPI: Kevin Frey is a 80yo here for followup for urinary retention  Blood pressure 116/70, pulse 74. NED. A&Ox3.   No respiratory distress   Abd soft, NT, ND Normal phallus with bilateral descended testicles  Cystoscopy Procedure Note  Patient identification was confirmed, informed consent was obtained, and patient was prepped using Betadine solution.  Lidocaine jelly was administered per urethral meatus.     Pre-Procedure: - Inspection reveals a normal caliber ureteral meatus.  Procedure: The flexible cystoscope was introduced without difficulty - No urethral strictures/lesions are present. - Enlarged prostate no median lobe - Normal bladder neck - Bilateral ureteral orifices identified - Bladder mucosa  reveals no ulcers, tumors, or lesions - No bladder stones - No trabeculation  Retroflexion shows no intravesical prostatic protrusion   Post-Procedure: - Patient tolerated the procedure well  Assessment/ Plan: We discussed the management of his BPH including continued medical therapy, Rezum, Urolift, TURP and simple prostatectomy. After discussing the options the patient has elected to proceed with Urolift. Risks/benefits/alternatives discussed.   Nicolette Bang, MD

## 2021-08-09 NOTE — Progress Notes (Signed)
Urological Symptom Review  Patient is experiencing the following symptoms: Blood in urine Urinary tract infection Injury to kidneys/bladder Patient has catheter   Review of Systems  Gastrointestinal (upper)  : Negative for upper GI symptoms  Gastrointestinal (lower) : Negative for lower GI symptoms  Constitutional : Negative for symptoms  Skin: Negative for skin symptoms  Eyes: Negative for eye symptoms  Ear/Nose/Throat : Sinus problems  Hematologic/Lymphatic: Easy bruising  Cardiovascular : Leg swelling  Respiratory : Cough Shortness of breath  Endocrine: Negative for endocrine symptoms  Musculoskeletal: Joint pain  Neurological: Negative for neurological symptoms  Psychologic: Negative for psychiatric symptoms

## 2021-08-18 NOTE — Progress Notes (Signed)
Surgical clearance fax to Dr. Candis Musa [phone]

## 2021-09-02 DIAGNOSIS — 419620001 Death: Secondary | SNOMED CT | POA: Diagnosis not present

## 2021-09-02 DEATH — deceased

## 2021-09-14 ENCOUNTER — Telehealth: Payer: Self-pay | Admitting: Dermatology

## 2021-09-14 NOTE — Telephone Encounter (Signed)
Patient's wife, Oleta Mouse, called to say that Mr. Rhue passed away 08/26/2021 and she wanted to make sure that you knew.  (Patient's chart [phone] is on table in kitchen if needed).

## 2021-09-18 ENCOUNTER — Ambulatory Visit: Admit: 2021-09-18 | Payer: Medicare Other | Admitting: Urology

## 2021-09-18 SURGERY — CYSTOSCOPY WITH INSERTION OF UROLIFT
Anesthesia: Monitor Anesthesia Care

## 2021-09-20 ENCOUNTER — Ambulatory Visit: Payer: Medicare Other | Admitting: Urology

## 2021-09-26 ENCOUNTER — Ambulatory Visit: Payer: Medicare Other | Admitting: Dermatology

## 2022-09-03 IMAGING — CT CT HEAD W/O CM
3 series · 16 of 47 positions shown, 19 images · non-contrast
Comparison: Head CT dated 07/31/2021.

CLINICAL DATA: Head trauma.

EXAM:
CT HEAD WITHOUT CONTRAST
TECHNIQUE: Contiguous axial images were obtained from the base of the skull
through the vertex without intravenous contrast.

[Series 2: head w o · axial · 0.43mm/px · z∈[+15,+155]mm · 10 of 34 slices shown, 13 images]
[im 3/34  brain]
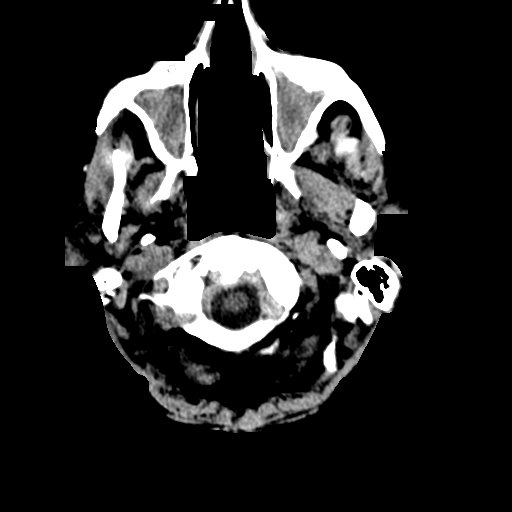
[im 3/34  bone]
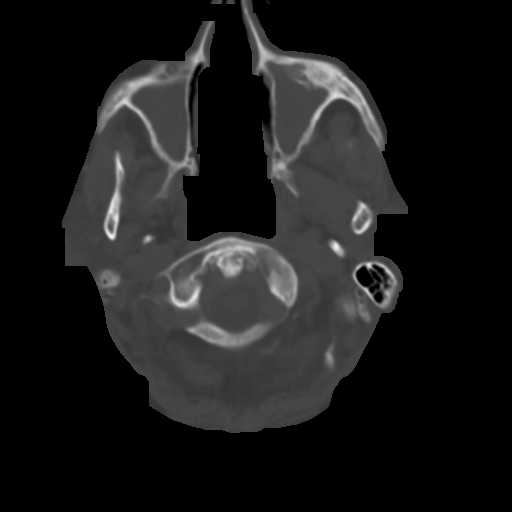
[im 6/34  brain]
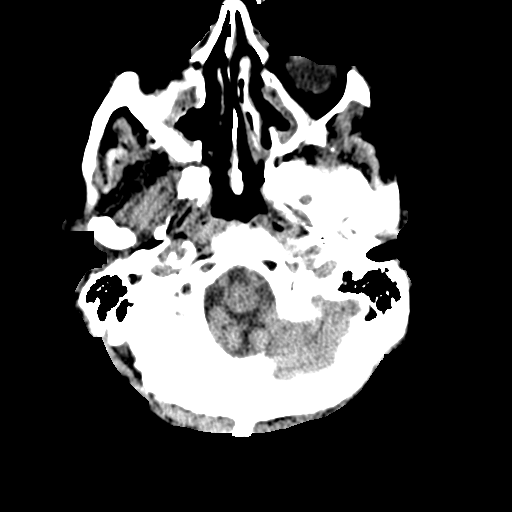
[im 10/34  brain]
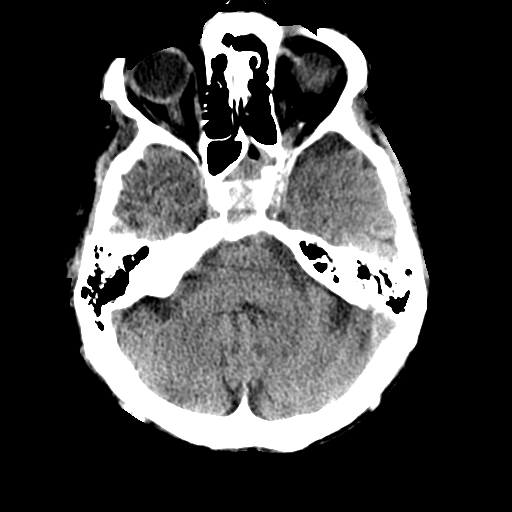
[im 12/34  brain]
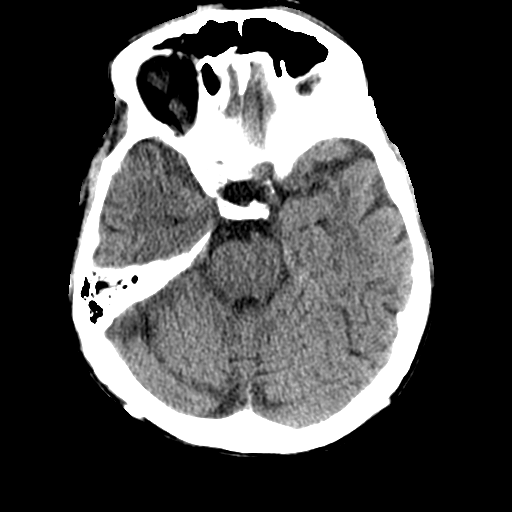
[im 15/34  brain]
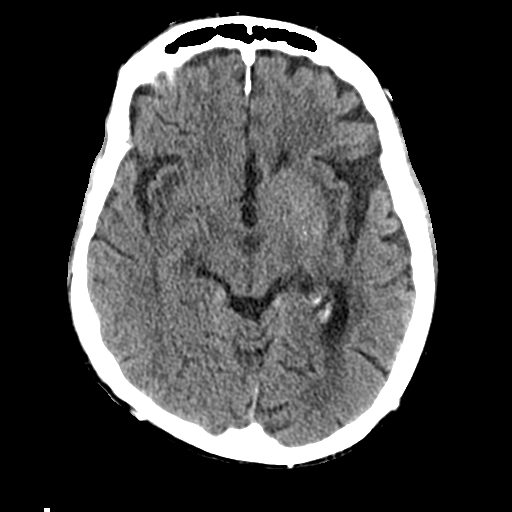
[im 15/34  bone]
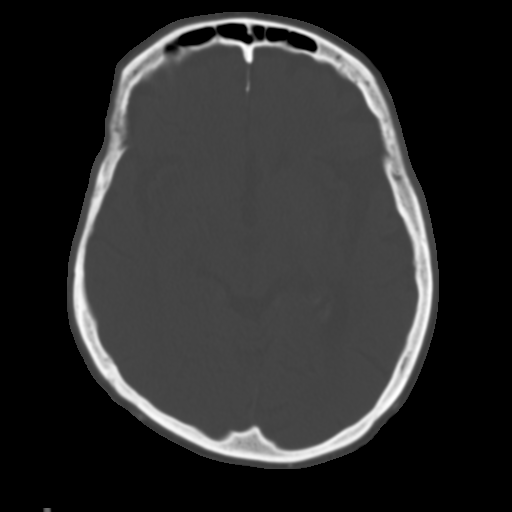
[im 19/34  brain]
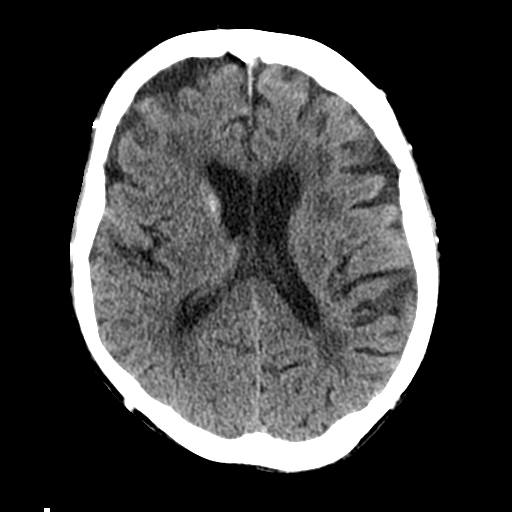
[im 22/34  brain]
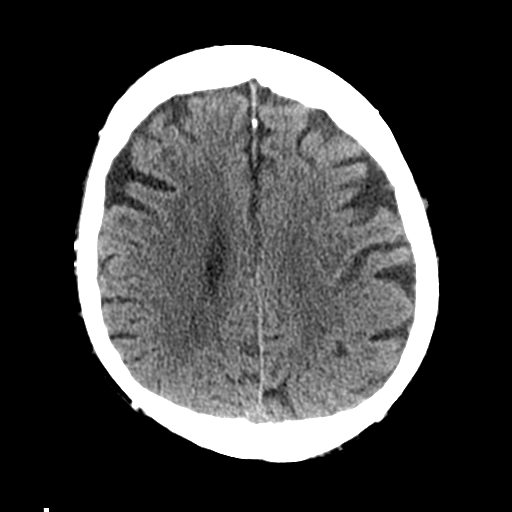
[im 26/34  brain]
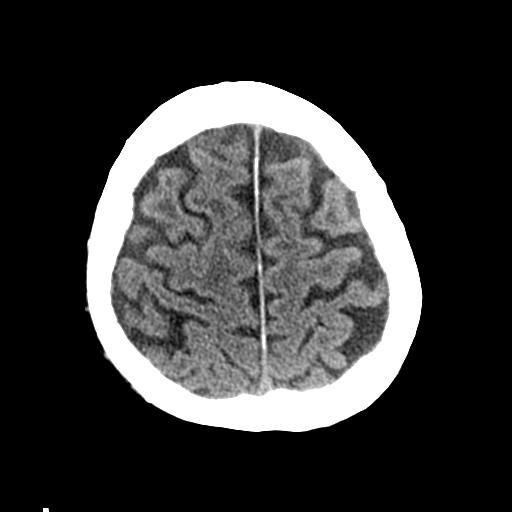
[im 28/34  brain]
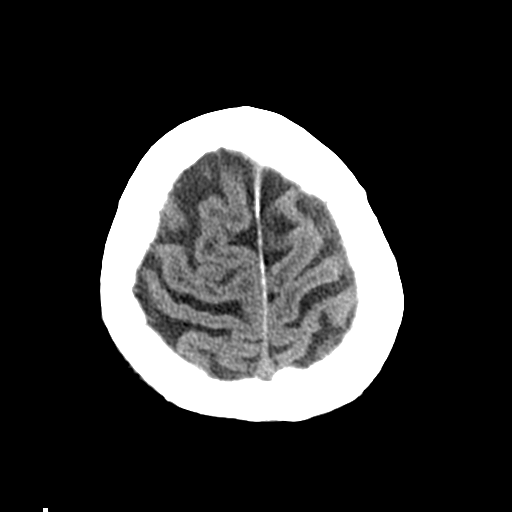
[im 28/34  bone]
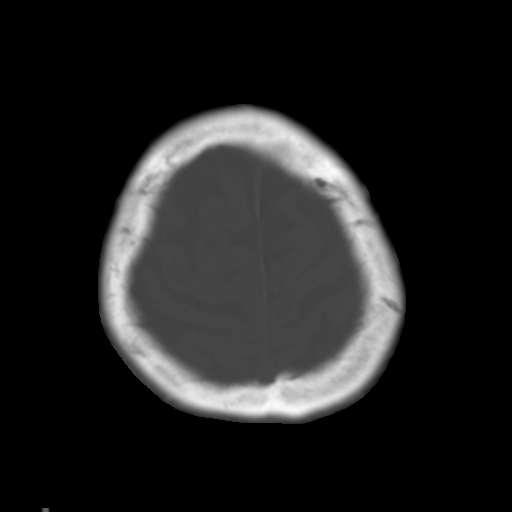
[im 31/34  brain]
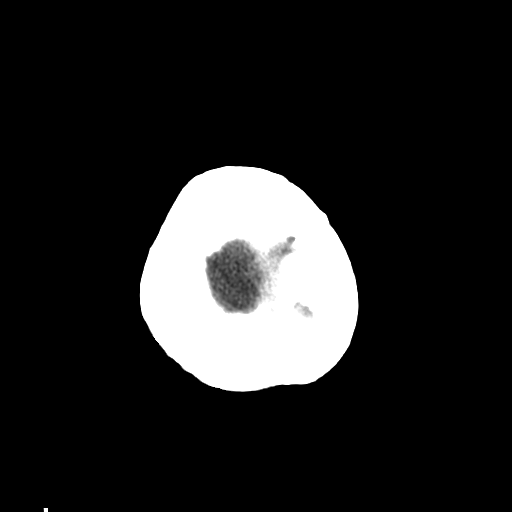

[Series 4: coronal soft · coronal · 0.34mm/px · 3 of 69 slices shown]
[im 23/69  brain]
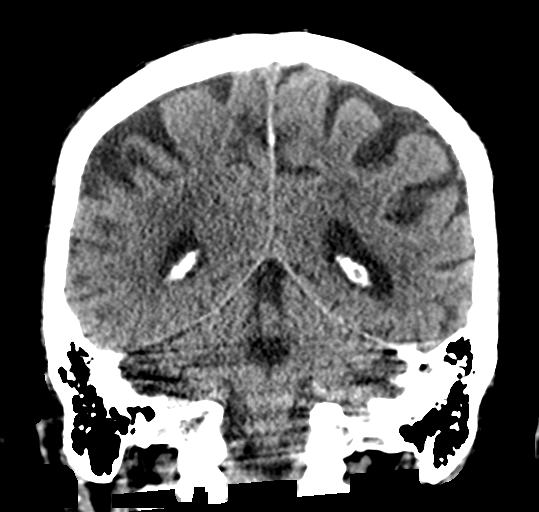
[im 31/69  brain]
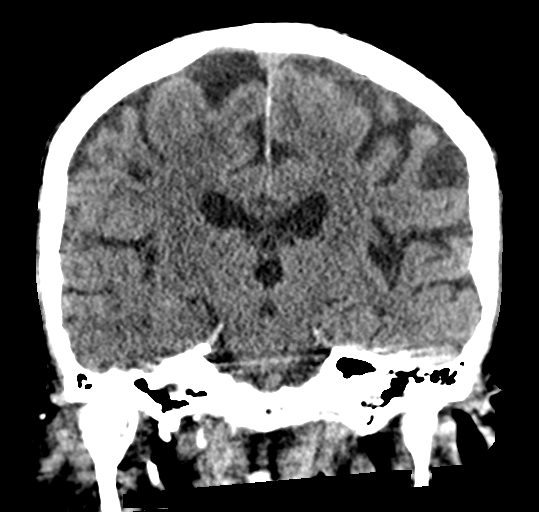
[im 38/69  brain]
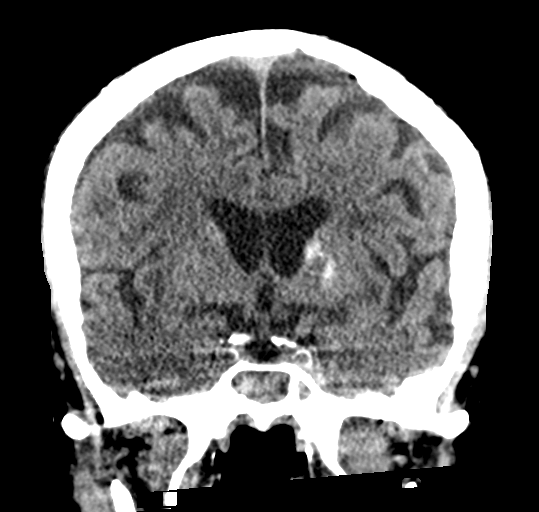

[Series 5: sagittal soft · sagittal · 0.38mm/px · 3 of 57 slices shown]
[im 19/57  brain]
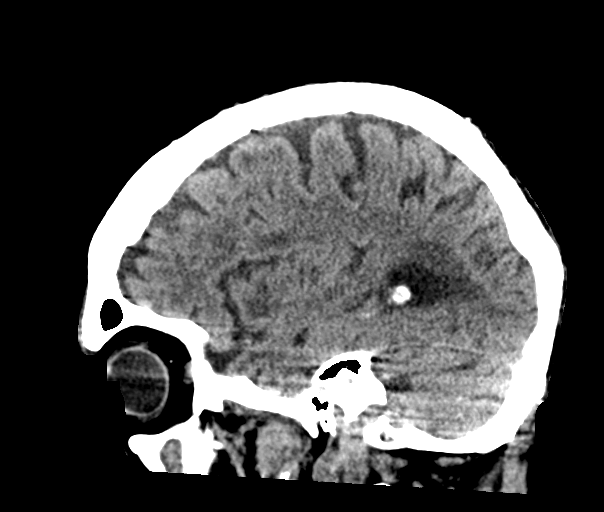
[im 29/57  brain]
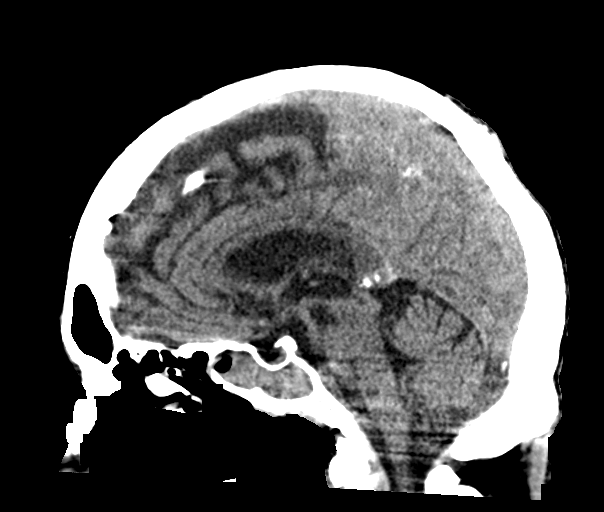
[im 38/57  brain]
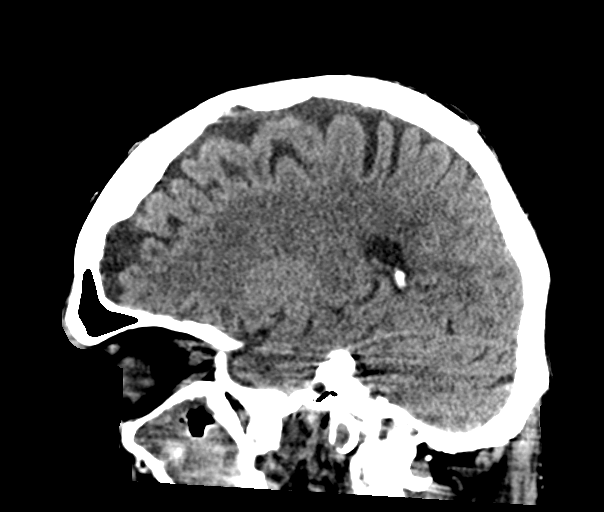

[16 of 47 positions shown; findings below may reference images not displayed]

FINDINGS: Brain: There is moderate age-related atrophy and chronic
microvascular ischemic changes. Bilateral basal ganglia
calcifications noted. There is no acute intracranial hemorrhage. No
mass effect or midline shift. No extra-axial fluid collection.

Vascular: No hyperdense vessel or unexpected calcification.

Skull: Normal. Negative for fracture or focal lesion.

Sinuses/Orbits: Complete opacification of the visualized maxillary
and sphenoid sinuses. No air-fluid level. The mastoid air cells are
clear.

Other: None
IMPRESSION: 1. No acute intracranial pathology.
2. Moderate age-related atrophy and chronic microvascular ischemic
changes.
3. Paranasal sinus disease.
